# Patient Record
Sex: Female | Born: 1990 | Marital: Married | Smoking: Never smoker
Health system: Southern US, Community
[De-identification: ages and names within clinical notes are randomized; demographics above are authoritative.]

## PROBLEM LIST (undated history)

## (undated) DIAGNOSIS — F32A Depression, unspecified: Secondary | ICD-10-CM

## (undated) DIAGNOSIS — G039 Meningitis, unspecified: Secondary | ICD-10-CM

## (undated) DIAGNOSIS — R519 Headache, unspecified: Secondary | ICD-10-CM

## (undated) DIAGNOSIS — D649 Anemia, unspecified: Secondary | ICD-10-CM

## (undated) DIAGNOSIS — J45909 Unspecified asthma, uncomplicated: Secondary | ICD-10-CM

## (undated) DIAGNOSIS — J189 Pneumonia, unspecified organism: Secondary | ICD-10-CM

## (undated) DIAGNOSIS — Z8719 Personal history of other diseases of the digestive system: Secondary | ICD-10-CM

## (undated) DIAGNOSIS — T8859XA Other complications of anesthesia, initial encounter: Secondary | ICD-10-CM

## (undated) DIAGNOSIS — C801 Malignant (primary) neoplasm, unspecified: Secondary | ICD-10-CM

## (undated) DIAGNOSIS — F909 Attention-deficit hyperactivity disorder, unspecified type: Secondary | ICD-10-CM

## (undated) HISTORY — DX: Depression, unspecified: F32.A

## (undated) HISTORY — DX: Anemia, unspecified: D64.9

## (undated) HISTORY — DX: Personal history of other diseases of the digestive system: Z87.19

## (undated) HISTORY — PX: APPENDECTOMY: SHX54

## (undated) HISTORY — PX: WISDOM TOOTH EXTRACTION: SHX21

## (undated) HISTORY — PX: HAND SURGERY: SHX662

## (undated) HISTORY — PX: MECKEL DIVERTICULUM EXCISION: SHX314

---
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
## 2012-01-03 ENCOUNTER — Emergency Department: Payer: Self-pay | Admitting: Emergency Medicine

## 2012-04-19 IMAGING — CR DG KNEE COMPLETE 4+V*R*
1 series · 4 of 4 positions shown · non-contrast
Comparison: none

REASON FOR EXAM: knee pain
COMMENTS:   May transport without cardiac monitor

PROCEDURE:     DXR - DXR KNEE RT COMP WITH OBLIQUES  - August 15, 2010  [DATE]
RESULT:     Findings:
4 views of the right knee demonstrates no acute fracture or dislocation.
There is no significant joint effusion.

[Series 1: view not recorded · 0.17mm/px · 4 of 4 slices shown]
[im 1/4]
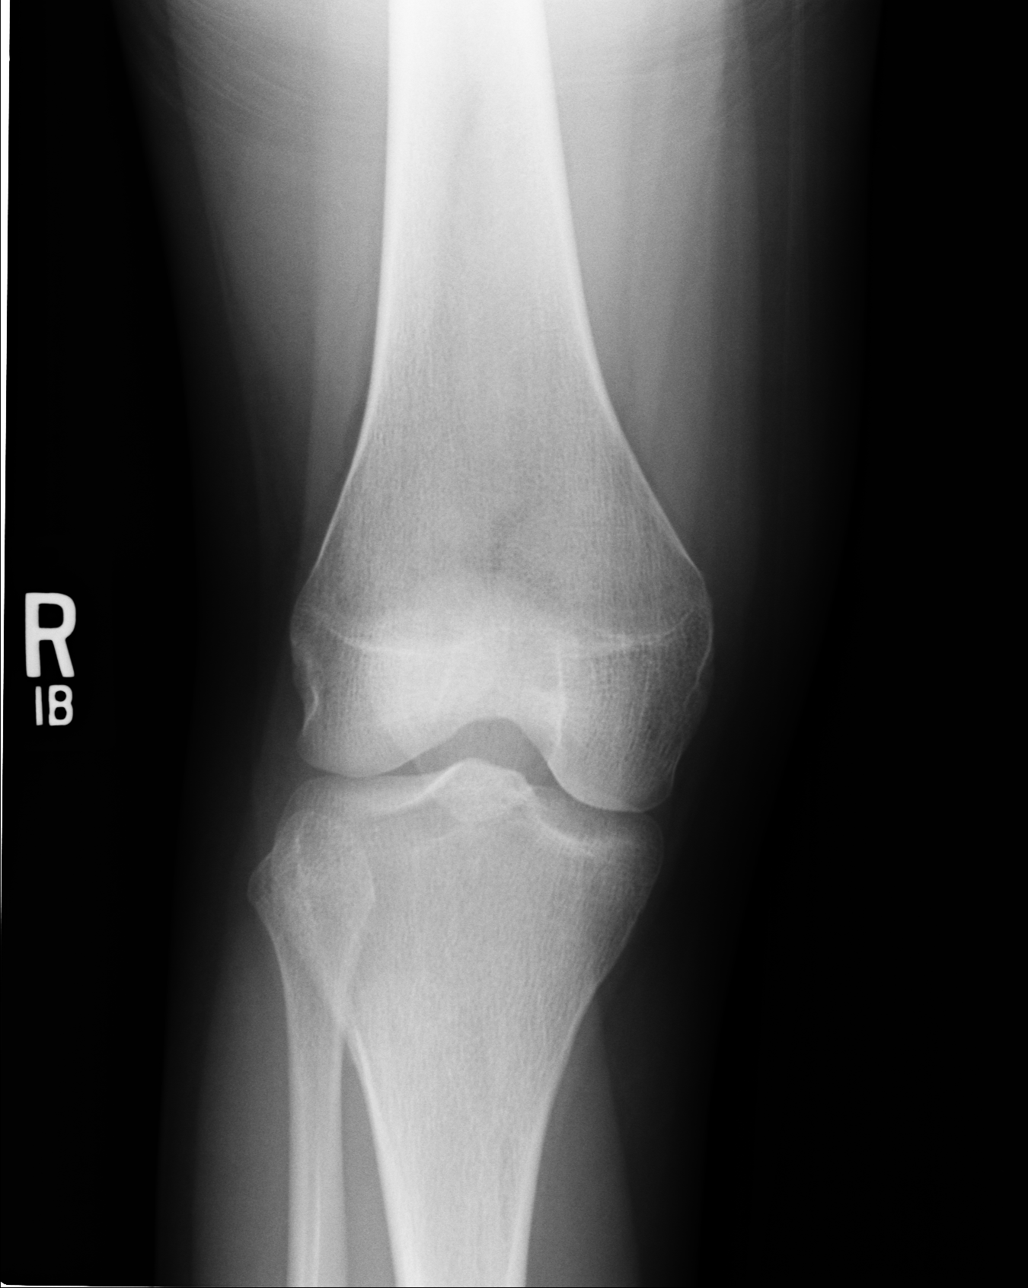
[im 2/4]
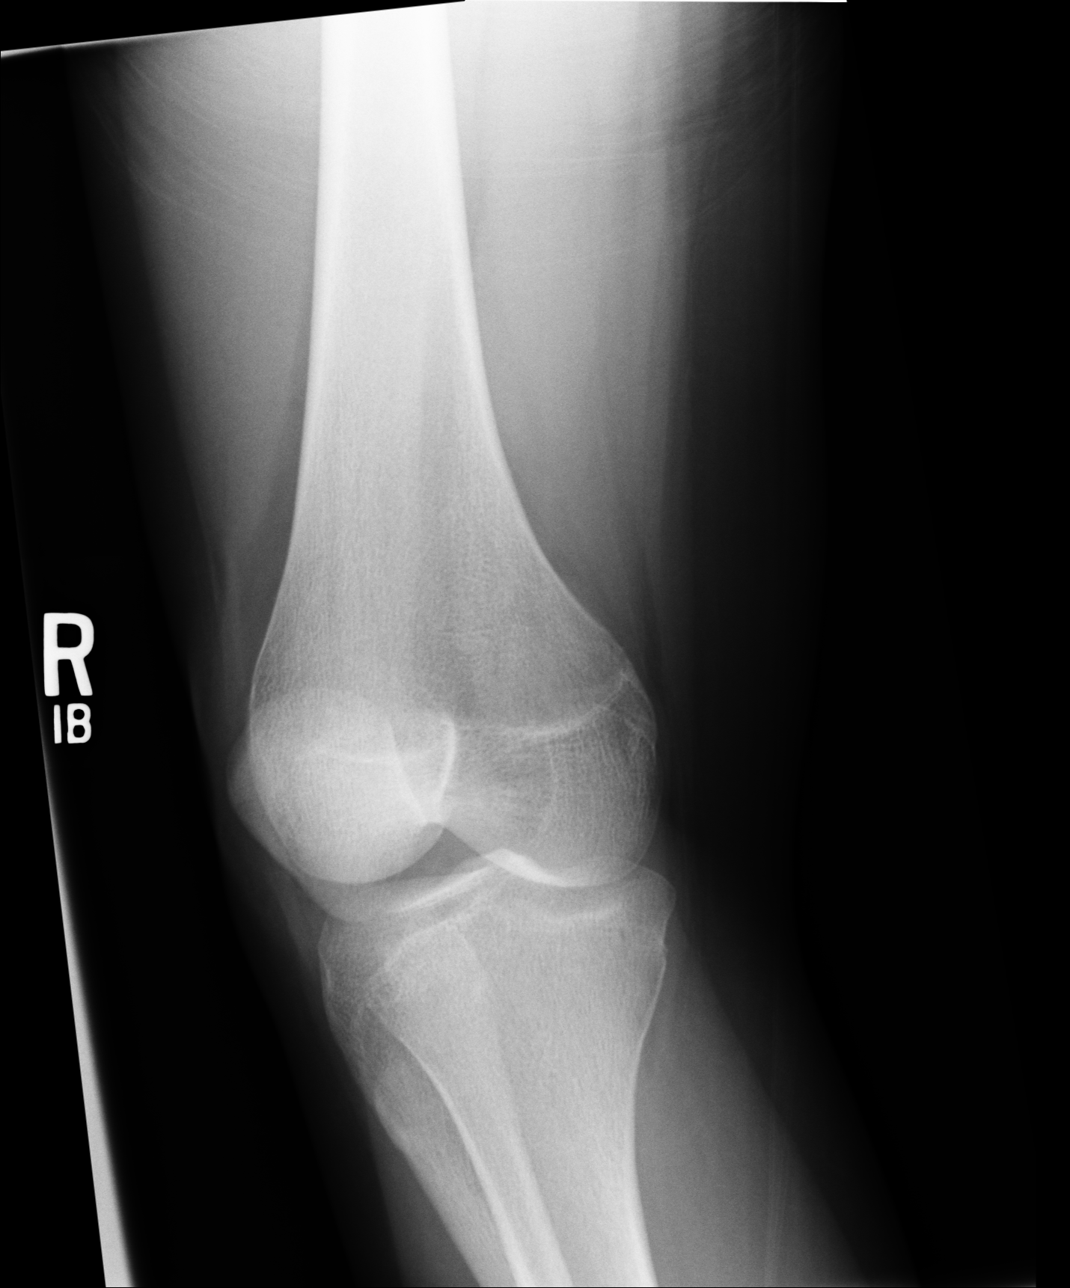
[im 3/4]
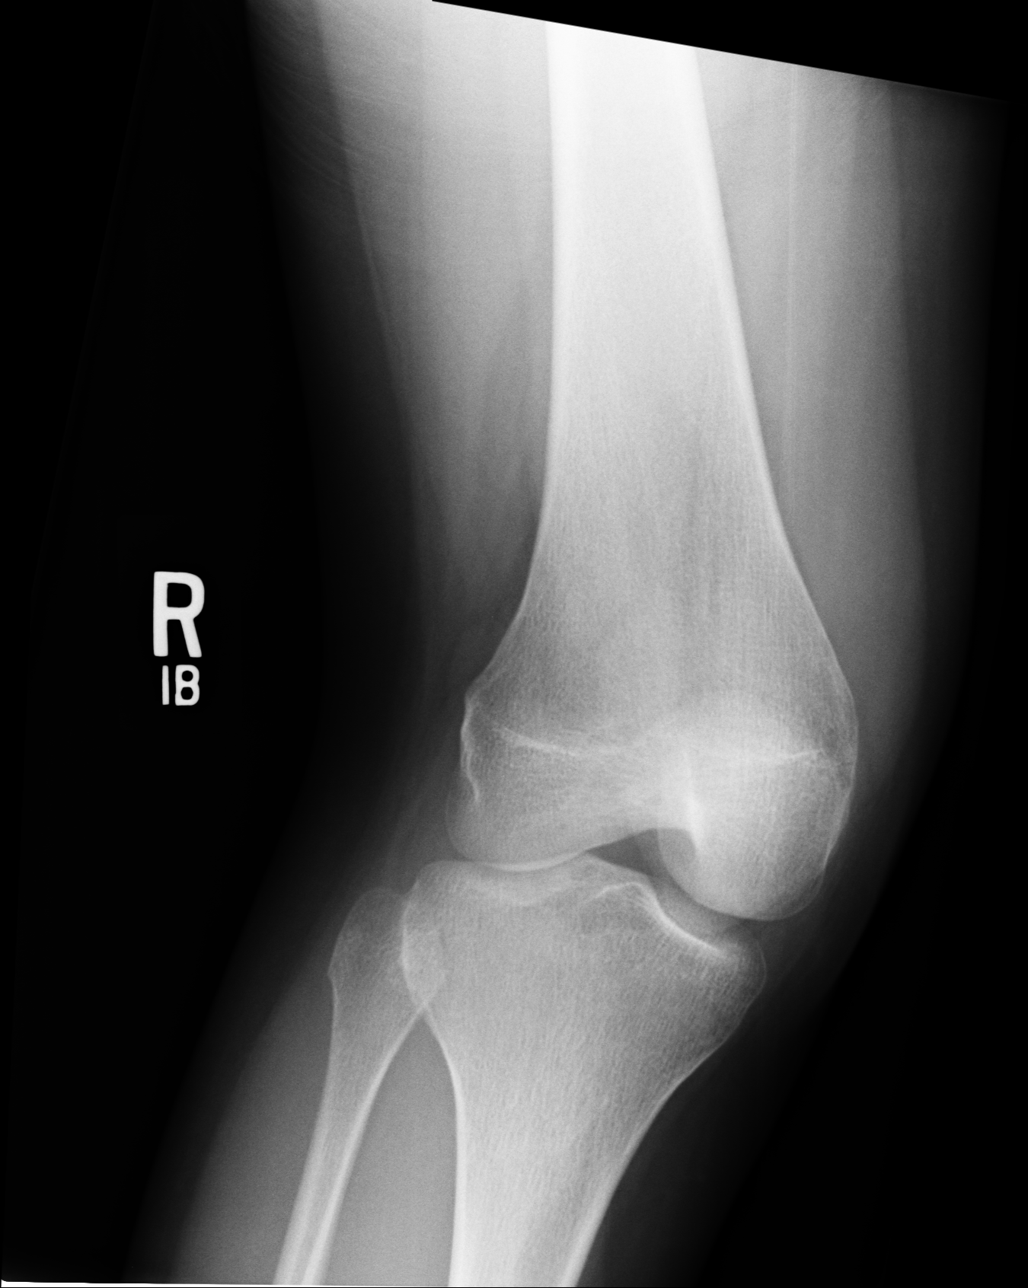
[im 4/4]
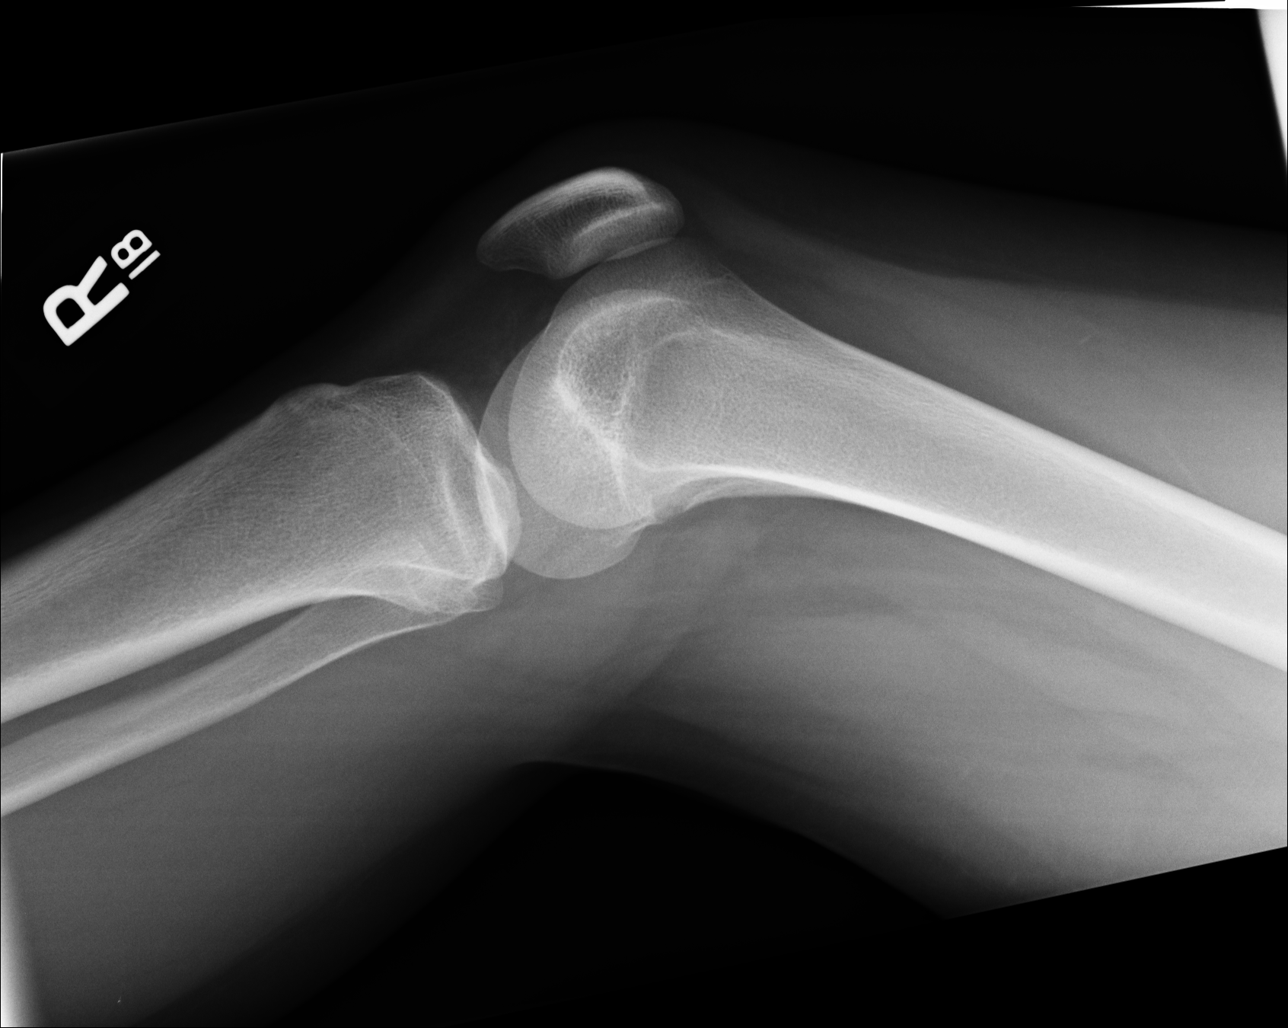

[4 of 4 positions shown; findings below may reference images not displayed]

IMPRESSION: No acute osseous injury of the right knee.

## 2013-04-11 ENCOUNTER — Emergency Department: Payer: Self-pay | Admitting: Emergency Medicine

## 2013-04-11 LAB — CBC
MCHC: 34.5 g/dL (ref 32.0–36.0)
MCV: 86 fL (ref 80–100)
Platelet: 257 10*3/uL (ref 150–440)
RBC: 4.29 10*6/uL (ref 3.80–5.20)
RDW: 13.7 % (ref 11.5–14.5)
WBC: 8.9 10*3/uL (ref 3.6–11.0)

## 2013-04-11 LAB — URINALYSIS, COMPLETE
Bilirubin,UR: NEGATIVE
Blood: NEGATIVE
Glucose,UR: NEGATIVE mg/dL (ref 0–75)
Protein: NEGATIVE
Squamous Epithelial: <1
WBC UR: 2 /HPF (ref 0–5)

## 2013-04-11 LAB — BASIC METABOLIC PANEL
Anion Gap: 5 — ABNORMAL LOW (ref 7–16)
Chloride: 108 mmol/L — ABNORMAL HIGH (ref 98–107)
EGFR (Non-African Amer.): >60
Glucose: 97 mg/dL (ref 65–99)
Potassium: 3.4 mmol/L — ABNORMAL LOW (ref 3.5–5.1)

## 2013-04-14 LAB — BETA STREP CULTURE(ARMC)

## 2015-09-23 DIAGNOSIS — G039 Meningitis, unspecified: Secondary | ICD-10-CM

## 2015-09-23 HISTORY — DX: Meningitis, unspecified: G03.9

## 2016-02-23 ENCOUNTER — Encounter: Payer: Self-pay | Admitting: Emergency Medicine

## 2016-02-23 ENCOUNTER — Inpatient Hospital Stay
Admission: EM | Admit: 2016-02-23 | Discharge: 2016-02-26 | DRG: 872 | Disposition: A | Discharge status: Home / Self Care | Payer: 59 | Attending: Internal Medicine | Admitting: Internal Medicine

## 2016-02-23 ENCOUNTER — Emergency Department: Payer: Self-pay

## 2016-02-23 ENCOUNTER — Inpatient Hospital Stay: Payer: Self-pay

## 2016-02-23 DIAGNOSIS — R291 Meningismus: Secondary | ICD-10-CM

## 2016-02-23 DIAGNOSIS — A419 Sepsis, unspecified organism: Principal | ICD-10-CM | POA: Diagnosis present

## 2016-02-23 DIAGNOSIS — B349 Viral infection, unspecified: Secondary | ICD-10-CM | POA: Diagnosis present

## 2016-02-23 DIAGNOSIS — J029 Acute pharyngitis, unspecified: Secondary | ICD-10-CM | POA: Diagnosis present

## 2016-02-23 DIAGNOSIS — R519 Headache, unspecified: Secondary | ICD-10-CM

## 2016-02-23 DIAGNOSIS — R51 Headache: Secondary | ICD-10-CM

## 2016-02-23 DIAGNOSIS — Z818 Family history of other mental and behavioral disorders: Secondary | ICD-10-CM

## 2016-02-23 DIAGNOSIS — B2790 Infectious mononucleosis, unspecified without complication: Secondary | ICD-10-CM | POA: Diagnosis present

## 2016-02-23 DIAGNOSIS — M542 Cervicalgia: Secondary | ICD-10-CM | POA: Diagnosis present

## 2016-02-23 LAB — CBC WITH DIFFERENTIAL/PLATELET
BASOS ABS: 0.4 10*3/uL — AB (ref 0–0.1)
EOS ABS: 0 10*3/uL (ref 0–0.7)
HCT: 35.8 % (ref 35.0–47.0)
HEMOGLOBIN: 12.4 g/dL (ref 12.0–16.0)
LYMPHS ABS: 0.6 10*3/uL — AB (ref 1.0–3.6)
Lymphocytes Relative: 7 %
MCH: 29 pg (ref 26.0–34.0)
MCHC: 34.6 g/dL (ref 32.0–36.0)
MCV: 83.6 fL (ref 80.0–100.0)
Monocytes Absolute: 0.6 10*3/uL (ref 0.2–0.9)
Neutro Abs: 6.6 10*3/uL — ABNORMAL HIGH (ref 1.4–6.5)
Neutrophils Relative %: 80 %
Platelets: 235 10*3/uL (ref 150–440)
RBC: 4.29 MIL/uL (ref 3.80–5.20)
RDW: 14.1 % (ref 11.5–14.5)
WBC: 8.2 10*3/uL (ref 3.6–11.0)

## 2016-02-23 LAB — CSF CELL COUNT WITH DIFFERENTIAL
EOS CSF: 0 %
Eosinophils, CSF: 0 %
Lymphs, CSF: 67 %
Lymphs, CSF: 86 %
Monocyte-Macrophage-Spinal Fluid: 14 %
Monocyte-Macrophage-Spinal Fluid: 33 %
RBC Count, CSF: 2 /mm3 (ref 0–3)
RBC Count, CSF: 7 /mm3 — ABNORMAL HIGH (ref 0–3)
Segmented Neutrophils-CSF: 0 %
Segmented Neutrophils-CSF: 0 %
TUBE #: 1
Tube #: 1
WBC, CSF: 0 /mm3
WBC, CSF: 2 /mm3

## 2016-02-23 LAB — RAPID HIV SCREEN (HIV 1/2 AB+AG)
HIV 1/2 Antibodies: NONREACTIVE
HIV-1 P24 Antigen - HIV24: NONREACTIVE

## 2016-02-23 LAB — PROTEIN AND GLUCOSE, CSF
Glucose, CSF: 77 mg/dL — ABNORMAL HIGH (ref 40–70)
Total  Protein, CSF: 21 mg/dL (ref 15–45)

## 2016-02-23 LAB — COMPREHENSIVE METABOLIC PANEL
ALK PHOS: 49 U/L (ref 38–126)
ALT: 16 U/L (ref 14–54)
AST: 21 U/L (ref 15–41)
Albumin: 4.1 g/dL (ref 3.5–5.0)
Anion gap: 8 (ref 5–15)
BUN: 13 mg/dL (ref 6–20)
CALCIUM: 8.7 mg/dL — AB (ref 8.9–10.3)
CO2: 20 mmol/L — AB (ref 22–32)
CREATININE: 0.94 mg/dL (ref 0.44–1.00)
Chloride: 104 mmol/L (ref 101–111)
GFR calc non Af Amer: >60 mL/min (ref >60)
GLUCOSE: 134 mg/dL — AB (ref 65–99)
Potassium: 3.3 mmol/L — ABNORMAL LOW (ref 3.5–5.1)
SODIUM: 132 mmol/L — AB (ref 135–145)
Total Bilirubin: 0.7 mg/dL (ref 0.3–1.2)
Total Protein: 7.6 g/dL (ref 6.5–8.1)

## 2016-02-23 LAB — PROTIME-INR
INR: 1.16
PROTHROMBIN TIME: 15 s (ref 11.4–15.0)

## 2016-02-23 LAB — LACTIC ACID, PLASMA: Lactic Acid, Venous: 0.7 mmol/L (ref 0.5–2.0)

## 2016-02-23 LAB — TROPONIN I: Troponin I: <0.03 ng/mL (ref <0.031)

## 2016-02-23 LAB — POCT RAPID STREP A: Streptococcus, Group A Screen (Direct): NEGATIVE

## 2016-02-23 LAB — URINALYSIS COMPLETE WITH MICROSCOPIC (ARMC ONLY)
Bilirubin Urine: NEGATIVE
Glucose, UA: NEGATIVE mg/dL
Hgb urine dipstick: NEGATIVE
Ketones, ur: NEGATIVE mg/dL
Leukocytes, UA: NEGATIVE
Nitrite: NEGATIVE
Protein, ur: NEGATIVE mg/dL
Specific Gravity, Urine: 1.013 (ref 1.005–1.030)
pH: 5 (ref 5.0–8.0)

## 2016-02-23 LAB — HCG, QUANTITATIVE, PREGNANCY: hCG, Beta Chain, Quant, S: <1 m[IU]/mL (ref <5)

## 2016-02-23 LAB — LIPASE, BLOOD: Lipase: 31 U/L (ref 11–51)

## 2016-02-23 LAB — MONONUCLEOSIS SCREEN: Mono Screen: POSITIVE — AB

## 2016-02-23 LAB — RAPID INFLUENZA A&B ANTIGENS
Influenza A (ARMC): NEGATIVE
Influenza B (ARMC): NEGATIVE

## 2016-02-23 LAB — APTT: aPTT: 36 seconds (ref 24–36)

## 2016-02-23 MED ORDER — ACETAMINOPHEN 500 MG PO TABS
1000.0000 mg | ORAL_TABLET | Freq: Once | ORAL | Status: AC
  Administered 2016-02-23: 1000 mg via ORAL
  Filled 2016-02-23: qty 2

## 2016-02-23 MED ORDER — VANCOMYCIN HCL IN DEXTROSE 750-5 MG/150ML-% IV SOLN
750.0000 mg | Freq: Two times a day (BID) | INTRAVENOUS | Status: DC
  Filled 2016-02-23 (×2): qty 150

## 2016-02-23 MED ORDER — IBUPROFEN 600 MG PO TABS
600.0000 mg | ORAL_TABLET | Freq: Once | ORAL | Status: AC
  Administered 2016-02-23: 600 mg via ORAL

## 2016-02-23 MED ORDER — DIPHENHYDRAMINE HCL 50 MG/ML IJ SOLN
25.0000 mg | Freq: Once | INTRAMUSCULAR | Status: AC
  Administered 2016-02-23: 25 mg via INTRAVENOUS
  Filled 2016-02-23: qty 1

## 2016-02-23 MED ORDER — ONDANSETRON HCL 4 MG PO TABS
4.0000 mg | ORAL_TABLET | Freq: Four times a day (QID) | ORAL | Status: DC | PRN
  Filled 2016-02-23: qty 1

## 2016-02-23 MED ORDER — ACETAMINOPHEN 325 MG PO TABS
650.0000 mg | ORAL_TABLET | Freq: Four times a day (QID) | ORAL | Status: DC | PRN
  Administered 2016-02-23 – 2016-02-25 (×2): 650 mg via ORAL
  Filled 2016-02-23 (×2): qty 2

## 2016-02-23 MED ORDER — ONDANSETRON HCL 4 MG/2ML IJ SOLN
4.0000 mg | Freq: Once | INTRAMUSCULAR | Status: AC
  Administered 2016-02-23: 4 mg via INTRAVENOUS
  Filled 2016-02-23: qty 2

## 2016-02-23 MED ORDER — VANCOMYCIN HCL IN DEXTROSE 1-5 GM/200ML-% IV SOLN
1000.0000 mg | INTRAVENOUS | Status: AC
  Administered 2016-02-23: 1000 mg via INTRAVENOUS
  Filled 2016-02-23: qty 200

## 2016-02-23 MED ORDER — DEXTROSE 5 % IV SOLN
2.0000 g | Freq: Once | INTRAVENOUS | Status: AC
  Administered 2016-02-23: 2 g via INTRAVENOUS
  Filled 2016-02-23: qty 2

## 2016-02-23 MED ORDER — DEXTROSE 5 % IV SOLN
2.0000 g | Freq: Two times a day (BID) | INTRAVENOUS | Status: DC
  Filled 2016-02-23: qty 2

## 2016-02-23 MED ORDER — DEXAMETHASONE SODIUM PHOSPHATE 10 MG/ML IJ SOLN
15.0000 mg | Freq: Once | INTRAMUSCULAR | Status: AC
  Administered 2016-02-23: 15 mg via INTRAVENOUS
  Filled 2016-02-23: qty 2

## 2016-02-23 MED ORDER — LIDOCAINE HCL (PF) 1 % IJ SOLN
INTRAMUSCULAR | Status: AC
  Filled 2016-02-23: qty 5

## 2016-02-23 MED ORDER — SODIUM CHLORIDE 0.9% FLUSH
3.0000 mL | Freq: Two times a day (BID) | INTRAVENOUS | Status: DC
  Administered 2016-02-23 – 2016-02-25 (×6): 3 mL via INTRAVENOUS

## 2016-02-23 MED ORDER — AMOXICILLIN-POT CLAVULANATE 875-125 MG PO TABS
1.0000 | ORAL_TABLET | Freq: Two times a day (BID) | ORAL | Status: DC
  Administered 2016-02-23 – 2016-02-26 (×6): 1 via ORAL
  Filled 2016-02-23 (×6): qty 1

## 2016-02-23 MED ORDER — IBUPROFEN 600 MG PO TABS
ORAL_TABLET | ORAL | Status: AC
  Administered 2016-02-23: 600 mg via ORAL
  Filled 2016-02-23: qty 1

## 2016-02-23 MED ORDER — LIDOCAINE-EPINEPHRINE (PF) 1 %-1:200000 IJ SOLN
INTRAMUSCULAR | Status: AC
  Administered 2016-02-23: 30 mL
  Filled 2016-02-23: qty 30

## 2016-02-23 MED ORDER — ONDANSETRON HCL 4 MG/2ML IJ SOLN
4.0000 mg | Freq: Four times a day (QID) | INTRAMUSCULAR | Status: DC | PRN
  Administered 2016-02-24: 09:00:00 4 mg via INTRAVENOUS
  Filled 2016-02-23: qty 2

## 2016-02-23 MED ORDER — SODIUM CHLORIDE 0.9 % IV SOLN
INTRAVENOUS | Status: AC
  Administered 2016-02-23: 10:00:00 via INTRAVENOUS

## 2016-02-23 MED ORDER — SODIUM CHLORIDE 0.9 % IV BOLUS (SEPSIS)
1000.0000 mL | Freq: Once | INTRAVENOUS | Status: AC
  Administered 2016-02-23: 1000 mL via INTRAVENOUS

## 2016-02-23 MED ORDER — ENOXAPARIN SODIUM 40 MG/0.4ML ~~LOC~~ SOLN
40.0000 mg | SUBCUTANEOUS | Status: DC
  Administered 2016-02-23 – 2016-02-25 (×3): 40 mg via SUBCUTANEOUS
  Filled 2016-02-23 (×3): qty 0.4

## 2016-02-23 MED ORDER — MORPHINE SULFATE (PF) 4 MG/ML IV SOLN
4.0000 mg | Freq: Once | INTRAVENOUS | Status: AC
  Administered 2016-02-23: 4 mg via INTRAVENOUS
  Filled 2016-02-23: qty 1

## 2016-02-23 MED ORDER — MORPHINE SULFATE (PF) 2 MG/ML IV SOLN
2.0000 mg | Freq: Once | INTRAVENOUS | Status: AC
  Administered 2016-02-23: 2 mg via INTRAVENOUS
  Filled 2016-02-23: qty 1

## 2016-02-23 MED ORDER — MORPHINE SULFATE (PF) 2 MG/ML IV SOLN
2.0000 mg | INTRAVENOUS | Status: DC | PRN
  Administered 2016-02-23 – 2016-02-24 (×2): 2 mg via INTRAVENOUS
  Filled 2016-02-23 (×3): qty 1

## 2016-02-23 MED ORDER — ACETAMINOPHEN 650 MG RE SUPP
650.0000 mg | Freq: Four times a day (QID) | RECTAL | Status: DC | PRN
Start: 2016-02-23 — End: 2016-02-26

## 2016-02-23 MED ORDER — DEXAMETHASONE SODIUM PHOSPHATE 4 MG/ML IJ SOLN
8.0000 mg | Freq: Four times a day (QID) | INTRAMUSCULAR | Status: DC
  Administered 2016-02-23 – 2016-02-25 (×8): 8 mg via INTRAVENOUS
  Filled 2016-02-23 (×8): qty 2

## 2016-02-23 NOTE — ED Notes (Signed)
Pt states she believes the itching started after the vancomycin began infusing.. States she has had morphine before without reaction..Marland Kitchen

## 2016-02-23 NOTE — H&P (Signed)
[facility] [place] [facility]   PATIENT NAME: Christina RoersRachel Ruiz    MR#:  [id number]  DATE OF BIRTH:  [birth year]  DATE OF ADMISSION:  02/23/2016  PRIMARY CARE PHYSICIAN: Sherlene ShamsULLO, TERESA L, MD   REQUESTING/REFERRING PHYSICIAN: Fanny BienQuale, MD  CHIEF COMPLAINT:   Chief Complaint  Patient presents with  . Fever    HISTORY OF PRESENT ILLNESS:  Christina Ruiz  is a 25 y.o. female who presents with Fever, severe headache, sore throat, neck stiffness. Patient states that she developed sore throat yesterday, has been unable to eat or drink anything since that time. She then began to develop headache and neck stiffness. She states her headache is the worst she's ever had. She denies any vision changes, but does state that she has had some lightheadedness when she stands up. She's also had some nausea, but no vomiting. Here in the ED she had a fever of 104, she was tachycardic. She has significant nuchal rigidity and tenderness. Hospitalists were called for admission and further evaluation after meningitis protocol was initiated.  PAST MEDICAL HISTORY:   Past Medical History  Diagnosis Date  . History of Meckel's diverticulum     ? had some surgery as a child    PAST SURGICAL HISTORY:   Past Surgical History  Procedure Laterality Date  . Small intestine surgery      ?  Marland Kitchen. Appendectomy      SOCIAL HISTORY:   Social History  Substance Use Topics  . Smoking status: Never Smoker   . Smokeless tobacco: Never Used  . Alcohol Use: No    FAMILY HISTORY:   Family History  Problem Relation Age of Onset  . Bipolar disorder Mother   . Heart disease Other     DRUG ALLERGIES:  No Known Allergies  MEDICATIONS AT HOME:   Prior to Admission medications   Medication Sig Start Date End Date Taking? Authorizing Provider  ferrous sulfate 325 (65 FE) MG tablet Take 325 mg by mouth daily with breakfast.      Historical Provider, MD  Multiple Vitamin (MULTIVITAMIN) tablet  Take 1 tablet by mouth daily.      Historical Provider, MD  norethindrone-ethinyl estradiol (FEMHRT LOW DOSE) 0.5-2.5 MG-MCG per tablet Take 1 tablet by mouth daily.      Historical Provider, MD    REVIEW OF SYSTEMS:  Review of Systems  Constitutional: Positive for fever, chills and malaise/fatigue. Negative for weight loss.  HENT: Positive for sore throat. Negative for ear pain, hearing loss and tinnitus.   Eyes: Negative for blurred vision, double vision, pain and redness.  Respiratory: Negative for cough, hemoptysis and shortness of breath.   Cardiovascular: Negative for chest pain, palpitations, orthopnea and leg swelling.  Gastrointestinal: Negative for nausea, vomiting, abdominal pain, diarrhea and constipation.  Genitourinary: Negative for dysuria, frequency and hematuria.  Musculoskeletal: Positive for neck pain. Negative for back pain and joint pain.  Skin:       No acne, rash, or lesions  Neurological: Positive for headaches. Negative for dizziness, tremors, focal weakness and weakness.  Endo/Heme/Allergies: Negative for polydipsia. Does not bruise/bleed easily.  Psychiatric/Behavioral: Negative for depression. The patient is not nervous/anxious and does not have insomnia.      VITAL SIGNS:   Filed Vitals:   02/23/16 0425 02/23/16 0500 02/23/16 0552  BP: 119/62 122/76   Pulse: 152 118   Temp: 104 F (40 C)  103.3 F (39.6 C)  TempSrc: Oral  Oral  Resp: 24  18   Height:  (1.6 m)    Weight: 59.875 kg (132 lb)    SpO2: 100% 97%    Wt Readings from Last 3 Encounters:  02/23/16 59.875 kg (132 lb)  02/28/11 58.06 kg (128 lb) (59 %*, Z = 0.23)  01/24/11 56.246 kg (124 lb) (52 %*, Z = 0.05)   * Growth percentiles are based on CDC 2-20 Years data.    PHYSICAL EXAMINATION:  Physical Exam  Vitals reviewed. Constitutional: She is oriented to person, place, and time. She appears well-developed and well-nourished. No distress.  HENT:  Head: Normocephalic and  atraumatic.  Mouth/Throat: Oropharynx is clear and moist.  Eyes: Conjunctivae and EOM are normal. Pupils are equal, round, and reactive to light. No scleral icterus.  Neck: No JVD present. No thyromegaly present.  Positive Brudzinski sign  Cardiovascular: Regular rhythm and intact distal pulses.  Exam reveals no gallop and no friction rub.   No murmur heard. Tachycardic  Respiratory: Effort normal and breath sounds normal. No respiratory distress. She has no wheezes. She has no rales.  GI: Soft. Bowel sounds are normal. She exhibits no distension. There is no tenderness.  Musculoskeletal: Normal range of motion. She exhibits tenderness (significant neck stiffness and tenderness). She exhibits no edema.  No arthritis, no gout  Lymphadenopathy:    She has no cervical adenopathy.  Neurological: She is alert and oriented to person, place, and time. No cranial nerve deficit.  No dysarthria, no aphasia  Skin: Skin is warm and dry. No rash noted. No erythema.  Psychiatric: She has a normal mood and affect. Her behavior is normal. Judgment and thought content normal.    LABORATORY PANEL:   CBC  Recent Labs Lab 02/23/16 0441  WBC 8.2  HGB 12.4  HCT 35.8  PLT 235   ------------------------------------------------------------------------------------------------------------------  Chemistries   Recent Labs Lab 02/23/16 0441  NA 132*  K 3.3*  CL 104  CO2 20*  GLUCOSE 134*  BUN 13  CREATININE 0.94  CALCIUM 8.7*  AST 21  ALT 16  ALKPHOS 49  BILITOT 0.7   ------------------------------------------------------------------------------------------------------------------  Cardiac Enzymes  Recent Labs Lab 02/23/16 0441  TROPONINI <0.03   ------------------------------------------------------------------------------------------------------------------  RADIOLOGY:  No results found.  EKG:   Orders placed or performed during the hospital encounter of 02/23/16  . ED EKG  12-Lead  . ED EKG 12-Lead  . EKG 12-Lead  . EKG 12-Lead    IMPRESSION AND PLAN:  Principal Problem:   Sepsis (HCC) - suspected meningitis possibly with strep throat. IV antibiotics and IV steroids started in the ED, we'll continues on admission. CSF cultures and blood cultures sent. Hemodynamically stable, lactic acid normal, IV fluids for hydration. Active Problems:   Neck pain - pain control ordered, suspect this is due to meningitis, treatment for the same as above   Sore throat - strep cultures sent, antibiotics as above  All the records are reviewed and case discussed with ED provider. Management plans discussed with the patient and/or family.  DVT PROPHYLAXIS: SubQ lovenox  GI PROPHYLAXIS: None  ADMISSION STATUS: Inpatient  CODE STATUS: Full Code Status History    This patient does not have a recorded code status. Please follow your organizational policy for patients in this situation.      TOTAL TIME TAKING CARE OF THIS PATIENT: 45 minutes.    Keanu Lesniak FIELDING 02/23/2016, 6:05 AM  [facility]  Office  [phone]  CC: Primary care physician; Sherlene Shams, MD

## 2016-02-23 NOTE — ED Notes (Signed)
Pt with nuchal rigidity, back pain, generalized body aches, shoulder pain and headache with fever since yesterday. Pt obviously uncomfortable, pt cries and yells in pain when rn attempts to flex neck. Pt masked in triage.

## 2016-02-23 NOTE — Progress Notes (Signed)
Reported to Dr Allena KatzPatel that patient had an itching episode and redness on arm after receiving vancomycin in ED. Ordered regular diet and to discontinue vancomycin.

## 2016-02-23 NOTE — ED Provider Notes (Signed)
[facility] Emergency Department Provider Note  ____________________________________________  Time seen: Approximately 4:34 AM  I have reviewed the triage vital signs and the nursing notes.   HISTORY  Chief Complaint Fever    HPI Christina Ruiz is a 25 y.o. female presents to the hospital for evaluation of a severe headache and neck stiffness.  Denies previous medical history except for being diagnosed with possible strep throat yesterday having started taking Augmentin.  She reports that she's been feeling absolutely terrible, having a severe headache the worst she has ever had, a very stiff and extremely painful neck, a sore throat.  She doesn't report nausea and feeling sweaty and vomited. She has hardly been able to eat or drink anything all day. She is able to swallow, but states it hurts in the throat. She is not having any pain in the front of the neck but mostly in the back of the neck up into the head causing a throbbing severe headache.  She's had some nausea and mild upset stomach but denies severe abdominal pain, urinary symptoms or other concerns.  No history of poor immune state or immunosuppression that she does have a history of psoriasis.   Past Medical History  Diagnosis Date  . History of Meckel's diverticulum     ? had some surgery as a child    Patient Active Problem List   Diagnosis Date Noted  . Neck pain 02/23/2016  . Sore throat 02/23/2016  . Sepsis (HCC) 02/23/2016  . SOB (shortness of breath) 02/28/2011  . Syncope 01/24/2011  . Dizziness 01/24/2011    Past Surgical History  Procedure Laterality Date  . Small intestine surgery      ?  Marland Kitchen. Appendectomy      No current outpatient prescriptions on file.  Allergies Review of patient's allergies indicates no known allergies.  Family History  Problem Relation Age of Onset  . Bipolar disorder Mother   . Heart disease Other     Social History Social History   Substance Use Topics  . Smoking status: Never Smoker   . Smokeless tobacco: Never Used  . Alcohol Use: No    Review of Systems Constitutional: Fevers chills and weakness Eyes: No visual changes. ENT: No sore throat. Cardiovascular: Denies chest pain. Respiratory: Denies shortness of breath. Gastrointestinal: Crampy abdominal discomfort, nausea and feeling like she wants to vomit.  No diarrhea.  No constipation. She noticed her stools have been slightly yellowish in color recently. Genitourinary: Negative for dysuria. She denies pregnancy. Musculoskeletal: Negative for back pain. Skin: Negative for rash. Neurological: Negative for headaches, focal weakness or numbness.  10-point ROS otherwise negative.  No tick bites  ____________________________________________   PHYSICAL EXAM:  VITAL SIGNS: ED Triage Vitals  Enc Vitals Group     BP 02/23/16 0425 119/62 mmHg     Pulse Rate 02/23/16 0425 152     Resp 02/23/16 0425 24     Temp 02/23/16 0425 104 F (40 C)     Temp Source 02/23/16 0425 Oral     SpO2 02/23/16 0425 100 %     Weight 02/23/16 0425 132 lb (59.875 kg)     Height 02/23/16 0425 5\' 3"  (1.6 m)     Head Cir --      Peak Flow --      Pain Score 02/23/16 0425 10     Pain Loc --      Pain Edu? --      Excl. in GC? --  Constitutional: Alert and oriented. Moderately ill-appearing, appears in pain holding her neck still. Hand over her head due to headache. Eyes: Conjunctivae are normal. PERRL. EOMI. Head: Atraumatic. Nose: No congestion/rhinnorhea. Mouth/Throat: Mucous membranes are dry. Oropharynx slightly injected, tonsils equal, moderately swollen with slight exudates. There is no midline shift. No evidence of unilateral abnormality or evidence to support peritonsillar abscess. Neck: No stridor.  Patient unable to flex or extend the neck due to pain. The patient exhibits meningismus. Cardiovascular: Tachycardic rate, regular rhythm. Grossly normal heart sounds.   Good peripheral circulation. Respiratory: Slightly tachypnea but otherwise normal respiratory exam. No retractions. Lungs CTAB. Gastrointestinal: Soft and nontender. No distention. No CVA tenderness. Musculoskeletal: No lower extremity tenderness nor edema.  Neurologic:  Normal speech and language. No gross focal neurologic deficits are appreciated.Skin:  Skin is hot to the touch, dry and intact. No rash noted. Psychiatric: Mood and affect are slightly anxious, but extremely well oriented without alteration in mental state. Speech and behavior are normal.  ____________________________________________   LABS (all labs ordered are listed, but only abnormal results are displayed)  Labs Reviewed  COMPREHENSIVE METABOLIC PANEL - Abnormal; Notable for the following:    Sodium 132 (*)    Potassium 3.3 (*)    CO2 20 (*)    Glucose, Bld 134 (*)    Calcium 8.7 (*)    All other components within normal limits  CBC WITH DIFFERENTIAL/PLATELET - Abnormal; Notable for the following:    Neutro Abs 6.6 (*)    Lymphs Abs 0.6 (*)    Basophils Absolute 0.4 (*)    All other components within normal limits  URINALYSIS COMPLETEWITH MICROSCOPIC ([facility] ONLY) - Abnormal; Notable for the following:    Color, Urine YELLOW (*)    APPearance CLEAR (*)    Bacteria, UA RARE (*)    Squamous Epithelial / LPF 0-5 (*)    All other components within normal limits  MONONUCLEOSIS SCREEN - Abnormal; Notable for the following:    Mono Screen POSITIVE (*)    All other components within normal limits  CSF CELL COUNT WITH DIFFERENTIAL - Abnormal; Notable for the following:    Appearance, CSF CLEAR (*)    All other components within normal limits  CSF CELL COUNT WITH DIFFERENTIAL - Abnormal; Notable for the following:    Appearance, CSF CLEAR (*)    RBC Count, CSF 7 (*)    All other components within normal limits  PROTEIN AND GLUCOSE, CSF - Abnormal; Notable for the following:    Glucose, CSF 77 (*)    All other  components within normal limits  RAPID INFLUENZA A&B ANTIGENS ([facility] ONLY)  CSF CULTURE  CULTURE, BLOOD (ROUTINE X 2)  CULTURE, BLOOD (ROUTINE X 2)  URINE CULTURE  CULTURE, GROUP A STREP ([facility])  GRAM STAIN  LACTIC ACID, PLASMA  TROPONIN I  PROTIME-INR  LIPASE, BLOOD  APTT  HCG, QUANTITATIVE, PREGNANCY  RAPID HIV SCREEN (HIV 1/2 AB+AG)  CSF CELL COUNT WITH DIFFERENTIAL  HIV ANTIBODY (ROUTINE TESTING)  HERPES SIMPLEX VIRUS(HSV) DNA BY PCR  POCT RAPID STREP A   ____________________________________________  EKG  Reviewed and interpreted by me at 4:30 AM Heart rate 126 PR 170 QTc 400  Sinus tachycardia, nonspecific T-wave abnormality ____________________________________________  RADIOLOGY  DG Chest Port 1 View (Final result) Result time: 02/23/16 06:17:57   Final result by Rad Results In Interface (02/23/16 06:17:57)   Narrative:   CLINICAL DATA: Sepsis workup  EXAM: PORTABLE CHEST 1 VIEW  COMPARISON: 01/03/2012  FINDINGS: Normal heart size and mediastinal contours. Incidental biapical pleural thickening, stable. No infiltrate or edema. No effusion or pneumothorax. No acute osseous findings.  IMPRESSION: Negative portable chest.   Electronically Signed By: Marnee Spring M.D. On: 02/23/2016 06:17    ____________________________________________   PROCEDURES  Procedure(s) performed: Lumbar puncture  Critical Care performed: Yes, see critical care note(s)  LUMBAR PUNCTURE  Date/Time: 02/23/2016 at 5:57 AM Performed by: Sharyn Creamer  Consent: Verbal consent obtained. Written consent obtained. Risks and benefits: risks, benefits and alternatives were discussed Consent given by: Patient Patient understanding: patient states understanding of the procedure being performed  Patient consent: the patient's understanding of the procedure matches consent given  Procedure consent: procedure consent matches procedure scheduled  Relevant documents:  relevant documents present and verified  Test results: test results available and properly labeled Site marked: the operative site was marked Imaging studies: imaging studies available  Required items: required blood products, implants, devices, and special equipment available  Patient identity confirmed: verbally with patient and arm band  Time out: Immediately prior to procedure a "time out" was called to verify the correct patient, procedure, equipment, support staff and site/side marked as required.  Indications: Headache, fever, rule out meningitis  Anesthesia: local infiltration Local anesthetic: lidocaine 1% with epinephrine Anesthetic total: 3 ml Patient sedated:  Analgesia: Morphine, 4 mg  Preparation: Patient was prepped and draped in the usual sterile fashion. Lumbar space: L3-L4 interspace Patient's position: left lateral decubitus Needle gauge: 22 Needle length: 3.5 in Number of attempts: 1 Opening pressure: Less than 15 cm H2O Fluid appearance: Clear Tubes of fluid: 4 Total volume: 8 ml Post-procedure: site cleaned and adhesive bandage applied Patient tolerance: Patient tolerated the procedure well with no immediate complications  CRITICAL CARE Performed by: Sharyn Creamer   Total critical care time: 40 minutes  Critical care time was exclusive of separately billable procedures and treating other patients.  Critical care was necessary to treat or prevent imminent or life-threatening deterioration.  Critical care was time spent personally by me on the following activities: development of treatment plan with patient and/or surrogate as well as nursing, discussions with consultants, evaluation of patient's response to treatment, examination of patient, obtaining history from patient or surrogate, ordering and performing treatments and interventions, ordering and review of laboratory studies, ordering and review of radiographic studies, pulse oximetry and re-evaluation of  patient's condition.  Patient presents with evidence of sepsis, possible meningitis. Patient also has pharyngitis by clinical examination suspicious for possible causes including strep which may result in meningismus/meningitis, superinfection and bacteremia, potential for HIV/mono/viral versus other etiologies CONSIDERED. Due to the patient's presentation with severe headache, neck pain, elevated fever we did not delay antibiotic and menstruation due to potentially high risk for meningitis. Lumbar puncture performed successfully, neuro intact without complication. The patient does report improvement in her vital signs are improving after fluids and pain medication/Tylenol. Discussed with the hospitalist service, Dr. Anne Hahn at 5:50 AM although we do not have a clear exact etiology we will admit the patient for rule out meningitis as well as further workup and evaluation under the care of the internal medicine team due to severity of symptoms and clinical sepsis. ____________________________________________   INITIAL IMPRESSION / ASSESSMENT AND PLAN / ED COURSE  Pertinent labs & imaging results that were available during my care of the patient were reviewed by me and considered in my medical decision making (see chart for details).  Code sepsis initiated.  ED Sepsis - Repeat Assessment  Performed at:    530am  Last Vitals:    HR 102, R14, 105/55, 98RA  Heart:      Clear  Lungs:     Clear  Capillary Refill:   Less than 1 second  Peripheral Pulse (include location): Right arm, radial   Skin (include color):   Normal   ----------------------------------------- 4:57 AM on 02/23/2016 -----------------------------------------  Patient verbally consented as well as written consent for spinal tap to "evaluate for meningitis". Patient name, date of birth, and procedure verified with the patient prior. Based on her symptoms are severe headache, meningismus, high fever and presentation I  believe that spinal tap is a clinically indicated procedure and the benefits outweigh the risks. Reviewed with the patient who is consenting and agreeable, all questions answered.  Patient will be admitted with further workup including labs pending, clear indication of given concern for obvious infectious etiology with severe meningismus, elevated fever, tachycardia being criteria for sepsis possibly bacterial versus viral etiologies CONSIDERED. Antibiotics given ____________________________________________   FINAL CLINICAL IMPRESSION(S) / ED DIAGNOSES  Final diagnoses:  Sepsis, due to unspecified organism (HCC)  Meningismus  Pharyngitis      Sharyn Creamer, MD 02/23/16 [phone]

## 2016-02-23 NOTE — ED Notes (Signed)
The patient was transported to admission room 103 without any issues. The patient was also transported wearing a surgical mask due to possible meningitis.

## 2016-02-23 NOTE — Progress Notes (Addendum)
Patient ID: Christina Ruiz, female   DOB: 1990-11-07, 25 y.o.   MRN: [id number] [facility] [place] [facility]   PATIENT NAME: Christina Ruiz    MR#:  [id number]  DATE OF BIRTH:  1990-11-07  SUBJECTIVE:  Came in with fever, sore throat and neck pain  REVIEW OF SYSTEMS:   Review of Systems  Constitutional: Positive for fever. Negative for chills and weight loss.  HENT: Positive for sore throat. Negative for ear discharge, ear pain and nosebleeds.   Eyes: Negative for blurred vision, pain and discharge.  Respiratory: Negative for sputum production, shortness of breath, wheezing and stridor.   Cardiovascular: Negative for chest pain, palpitations, orthopnea and PND.  Gastrointestinal: Negative for nausea, vomiting, abdominal pain and diarrhea.  Genitourinary: Negative for urgency and frequency.  Musculoskeletal: Negative for back pain and joint pain.  Neurological: Positive for weakness and headaches. Negative for sensory change, speech change and focal weakness.  Psychiatric/Behavioral: Negative for depression and hallucinations. The patient is not nervous/anxious.  yes All other systems reviewed and are negative.  Tolerating Diet:yes Tolerating PT: not needed  DRUG ALLERGIES:  No Known Allergies  VITALS:  Blood pressure 100/67, pulse 65, temperature 97.8 F (36.6 C), temperature source Oral, resp. rate 18, height 5\' 3"  (1.6 m), weight 60.47 kg (133 lb 5 oz), last menstrual period 01/25/2016, SpO2 100 %.  PHYSICAL EXAMINATION:   Physical Exam  GENERAL:  25 y.o.-year-old patient lying in the bed with no acute distress.  EYES: Pupils equal, round, reactive to light and accommodation. No scleral icterus. Extraocular muscles intact.  HEENT: Head atraumatic, normocephalic. Oropharynx and nasopharynx clear.  NECK:  Supple, no jugular venous distention. No thyroid enlargement, no tenderness.  LUNGS: Normal breath sounds bilaterally, no wheezing, rales,  rhonchi. No use of accessory muscles of respiration.  CARDIOVASCULAR: S1, S2 normal. No murmurs, rubs, or gallops.  ABDOMEN: Soft, nontender, nondistended. Bowel sounds present. No organomegaly or mass.  EXTREMITIES: No cyanosis, clubbing or edema b/l.    NEUROLOGIC: Cranial nerves II through XII are intact. No focal Motor or sensory deficits b/l.   PSYCHIATRIC:  patient is alert and oriented x 3.  SKIN: No obvious rash, lesion, or ulcer.   LABORATORY PANEL:  CBC  Recent Labs Lab 02/23/16 0441  WBC 8.2  HGB 12.4  HCT 35.8  PLT 235    Chemistries   Recent Labs Lab 02/23/16 0441  NA 132*  K 3.3*  CL 104  CO2 20*  GLUCOSE 134*  BUN 13  CREATININE 0.94  CALCIUM 8.7*  AST 21  ALT 16  ALKPHOS 49  BILITOT 0.7   Cardiac Enzymes  Recent Labs Lab 02/23/16 0441  TROPONINI <0.03   RADIOLOGY:  Dg Chest Port 1 View  02/23/2016  CLINICAL DATA:  Sepsis workup EXAM: PORTABLE CHEST 1 VIEW COMPARISON:  01/03/2012 FINDINGS: Normal heart size and mediastinal contours. Incidental biapical pleural thickening, stable. No infiltrate or edema. No effusion or pneumothorax. No acute osseous findings. IMPRESSION: Negative portable chest. Electronically Signed   By: Marnee SpringJonathon  Watts M.D.   On: 02/23/2016 06:17   ASSESSMENT AND PLAN:  Christina Ruiz is a 25 y.o. female who presents with Fever, severe headache, sore throat, neck stiffness. Patient states that she developed sore throat yesterday, has been unable to eat or drink anything since that time. She then began to develop headache and neck stiffness. She states her headache is the worst she's ever had. She denies any vision changes, but does state  that she has had some lightheadedness when she stands  1.fever,headache and sore throat with positive grp a strept(as out pt) and positive infect mono ([facility] -change to po augmentin -came in with fever of 104---afebrile noow. Feels better -symptomatic care  2.Fever/headache and neck  rigidity -CSF c/w viral meningitis -symptomatic care  3. DVT prophylaxis lovenox  Case discussed with Care Management/Social Worker. Management plans discussed with the patient, family and they are in agreement.  CODE STATUS:full DVT Prophylaxis: lovenox TOTAL TIME TAKING CARE OF THIS PATIENT: .  >50% time spent on counselling and coordination of care  POSSIBLE D/C IN 1-2 DAYS, DEPENDING ON CLINICAL CONDITION.  Note: This dictation was prepared with Dragon dictation along with smaller phrase technology. Any transcriptional errors that result from this process are unintentional.  Oberon Hehir M.D on 02/23/2016 at 4:01 PM  Between 7am to 6pm - Pager - [phone]  After 6pm go to [email or web address] - password [email or web address] [facility]  [facility]  Office  [phone]  CC: Primary care physician; Sherlene Shams, MD

## 2016-02-23 NOTE — Progress Notes (Addendum)
Pharmacy Antibiotic Note  Christina Ruiz is a 25 y.o. female admitted on 02/23/2016 with Fever, severe headache, sore throat and neck stiffness. Patient being treated for possible meningitis.    Pharmacy has been consulted for ceftriaxone and vancomycin dosing.  CSF showing no WBC or organisms at of 0645 02/23/16  MONO screen POSITIVE   Plan: CrCl: 4277ml/mL    Scr:  0.94    Ke: 0.068   T1/2: 10.2   VD: 42  Will start patient on vancomcyin 750mg  every 12 hours with 6 hour stack dosing. Patient received 1gm IV in ED at 0700. Calculated trough at Css 15.5. Trough ordered prior to 5th dose on 02/24/16 @ 2330.   ----Vancomcyin Discontinued due to patient experiencing itching and redness after 1st infusion---  Will start the patient on ceftriaxone 2gm every 12 hours for meningitis.   Pharmacy will continue  to monitor and adjust doses as needed.    Height: 5\' 3"  (160 cm) Weight: 133 lb 5 oz (60.47 kg) IBW/kg (Calculated) : 52.4  Temp (24hrs), Avg:101.1 F (38.4 C), Min:98.4 F (36.9 C), Max:104 F (40 C)   Recent Labs Lab 02/23/16 0441  WBC 8.2  CREATININE 0.94  LATICACIDVEN 0.7    Estimated Creatinine Clearance: 77.7 mL/min (by C-G formula based on Cr of 0.94).    No Known Allergies  Antimicrobials this admission: 06/3 Vancomycin  >>  06/3 Ceftriaxone >>   Microbiology results: 6/3 BCx: pending 6/3 UCx: pending  Thank you for allowing pharmacy to be a part of this patient's care.  Christina Ruiz, PharmD Pharmacy Resident  02/23/2016 9:27 AM

## 2016-02-24 LAB — URINE CULTURE: Culture: NO GROWTH

## 2016-02-24 LAB — BASIC METABOLIC PANEL
ANION GAP: 5 (ref 5–15)
BUN: 13 mg/dL (ref 6–20)
CALCIUM: 8.9 mg/dL (ref 8.9–10.3)
CHLORIDE: 107 mmol/L (ref 101–111)
CO2: 26 mmol/L (ref 22–32)
Creatinine, Ser: 0.86 mg/dL (ref 0.44–1.00)
GFR calc Af Amer: >60 mL/min (ref >60)
GFR calc non Af Amer: >60 mL/min (ref >60)
GLUCOSE: 140 mg/dL — AB (ref 65–99)
Potassium: 4.8 mmol/L (ref 3.5–5.1)
Sodium: 138 mmol/L (ref 135–145)

## 2016-02-24 LAB — CBC
HCT: 38.6 % (ref 35.0–47.0)
HEMOGLOBIN: 12.8 g/dL (ref 12.0–16.0)
MCH: 28.9 pg (ref 26.0–34.0)
MCHC: 33.3 g/dL (ref 32.0–36.0)
MCV: 86.9 fL (ref 80.0–100.0)
Platelets: 228 10*3/uL (ref 150–440)
RBC: 4.44 MIL/uL (ref 3.80–5.20)
RDW: 13.9 % (ref 11.5–14.5)
WBC: 8.6 10*3/uL (ref 3.6–11.0)

## 2016-02-24 LAB — HERPES SIMPLEX VIRUS(HSV) DNA BY PCR
HSV 1 DNA: NEGATIVE
HSV 2 DNA: NEGATIVE

## 2016-02-24 MED ORDER — MORPHINE SULFATE (PF) 2 MG/ML IV SOLN
INTRAVENOUS | Status: AC
  Filled 2016-02-24: qty 1

## 2016-02-24 MED ORDER — MORPHINE SULFATE (PF) 2 MG/ML IV SOLN
2.0000 mg | Freq: Once | INTRAVENOUS | Status: AC
  Administered 2016-02-24: 2 mg via INTRAVENOUS

## 2016-02-24 MED ORDER — ALPRAZOLAM 0.25 MG PO TABS
0.2500 mg | ORAL_TABLET | Freq: Three times a day (TID) | ORAL | Status: DC | PRN
  Administered 2016-02-24: 23:00:00 0.25 mg via ORAL
  Filled 2016-02-24: qty 1

## 2016-02-24 MED ORDER — OXYCODONE HCL 5 MG PO TABS
5.0000 mg | ORAL_TABLET | ORAL | Status: DC | PRN
  Administered 2016-02-24 – 2016-02-25 (×3): 5 mg via ORAL
  Filled 2016-02-24 (×4): qty 1

## 2016-02-24 MED ORDER — KETOROLAC TROMETHAMINE 30 MG/ML IJ SOLN
15.0000 mg | Freq: Four times a day (QID) | INTRAMUSCULAR | Status: DC | PRN
  Administered 2016-02-24 – 2016-02-25 (×2): 15 mg via INTRAVENOUS
  Filled 2016-02-24 (×2): qty 1

## 2016-02-24 NOTE — Progress Notes (Addendum)
Patient ID: Christina Ruiz, female   DOB: 10-28-1990, 25 y.o.   MRN: [id number] [facility] [place] at [facility]   PATIENT NAME: Christina Ruiz    MR#:  [id number]  DATE OF BIRTH:  12-04-1990  SUBJECTIVE:  Came in with fever, sore throat and neck pain Today patient is reporting decreased by mouth intake and generalized weakness with body pains. Tolerating by mouth Denies any neck stiffness  REVIEW OF SYSTEMS:   Review of Systems  Constitutional: Positive for fever. Negative for chills and weight loss.  HENT: Positive for sore throat. Negative for ear discharge, ear pain and nosebleeds.   Eyes: Negative for blurred vision, pain and discharge.  Respiratory: Negative for sputum production, shortness of breath, wheezing and stridor.   Cardiovascular: Negative for chest pain, palpitations, orthopnea and PND.  Gastrointestinal: Negative for nausea, vomiting, abdominal pain and diarrhea.  Genitourinary: Negative for urgency and frequency.  Musculoskeletal: Negative for back pain and joint pain.  Neurological: Positive for weakness and headaches. Negative for sensory change, speech change and focal weakness.  Psychiatric/Behavioral: Negative for depression and hallucinations. The patient is not nervous/anxious.   All other systems reviewed and are negative.  Tolerating Diet:yes Tolerating PT: not needed  DRUG ALLERGIES:  No Known Allergies  VITALS:  Blood pressure 95/54, pulse 54, temperature 98.3 F (36.8 C), temperature source Oral, resp. rate 18, height  (1.6 m), weight 60.47 kg (133 lb 5 oz), last menstrual period 01/25/2016, SpO2 99 %.  PHYSICAL EXAMINATION:   Physical Exam  GENERAL:  25 y.o.-year-old patient lying in the bed with no acute distress.  EYES: Pupils equal, round, reactive to light and accommodation. No scleral icterus. Extraocular muscles intact.  HEENT: Head atraumatic, normocephalic. Oropharynx and nasopharynx Erythematous, with  purulent plaques uvula midline NECK:  Supple, no jugular venous distention. No thyroid enlargement, no tenderness.  LUNGS: Normal breath sounds bilaterally, no wheezing, rales, rhonchi. No use of accessory muscles of respiration.  CARDIOVASCULAR: S1, S2 normal. No murmurs, rubs, or gallops.  ABDOMEN: Soft, nontender, nondistended. Bowel sounds present. No organomegaly or mass.  EXTREMITIES: No cyanosis, clubbing or edema b/l.    NEUROLOGIC: Cranial nerves II through XII are intact. No focal Motor or sensory deficits b/l.   PSYCHIATRIC:  patient is alert and oriented x 3.  SKIN: No obvious rash, lesion, or ulcer.   LABORATORY PANEL:  CBC  Recent Labs Lab 02/24/16 0536  WBC 8.6  HGB 12.8  HCT 38.6  PLT 228    Chemistries   Recent Labs Lab 02/23/16 0441 02/24/16 0536  NA 132* 138  K 3.3* 4.8  CL 104 107  CO2 20* 26  GLUCOSE 134* 140*  BUN 13 13  CREATININE 0.94 0.86  CALCIUM 8.7* 8.9  AST 21  --   ALT 16  --   ALKPHOS 49  --   BILITOT 0.7  --    Cardiac Enzymes  Recent Labs Lab 02/23/16 0441  TROPONINI <0.03   RADIOLOGY:  Dg Chest Port 1 View  02/23/2016  CLINICAL DATA:  Sepsis workup EXAM: PORTABLE CHEST 1 VIEW COMPARISON:  01/03/2012 FINDINGS: Normal heart size and mediastinal contours. Incidental biapical pleural thickening, stable. No infiltrate or edema. No effusion or pneumothorax. No acute osseous findings. IMPRESSION: Negative portable chest. Electronically Signed   By: Marnee Spring M.D.   On: 02/23/2016 06:17   ASSESSMENT AND PLAN:  Christina Ruiz is a 25 y.o. female who presents with Fever, severe headache, sore throat, neck  stiffness. Patient states that she developed sore throat yesterday, has been unable to eat or drink anything since that time. She then began to develop headache and neck stiffness. She states her headache is the worst she's ever had. She denies any vision changes, but does state that she has had some lightheadedness when she  stands  1.fever,headache and sore throat with positive grp a strept(as out pt) and positive infect mono [facility] -Throat cultures and urine cultures are pending -Continue po augmentin -came in with fever of 104---afebrile now. Feels better -symptomatic care -Taper IV steroids -We will consider ENT consult if no improvement clinically -Pain management with by mouth oxycodone as needed  2.Fever/headache and neck rigidity -CSF c/w viral meningitis. CSF cultures with no organisms -symptomatic care  3. DVT prophylaxis lovenox  Case discussed with Care Management/Social Worker. Management plans discussed with the patient, family and they are in agreement.  CODE STATUS:full DVT Prophylaxis: lovenox TOTAL TIME TAKING CARE OF THIS PATIENT: 30minutes.  >50% time spent on counselling and coordination of care  POSSIBLE D/C IN am DAYS, DEPENDING ON CLINICAL CONDITION.  Note: This dictation was prepared with Dragon dictation along with smaller phrase technology. Any transcriptional errors that result from this process are unintentional.  Ramonita LabGouru, Kyrese Gartman M.D on 02/24/2016 at 1:13 PM  Between 7am to 6pm - Pager - [phone]  After 6pm go to [email or web address] - password [email or web address] [facility]  [facility] [place] [facility]  Office  [phone]  CC: Primary care physician; Sherlene ShamsULLO, TERESA L, MD

## 2016-02-24 NOTE — Progress Notes (Signed)
Pt c/o of headache and throbbing sensation in head/ PRN roxicodone given with no improvement/ pt crying from pain/ Md paged to make aware/ neuro consult ordered/ and iv morphine ordered to help with pain

## 2016-02-25 ENCOUNTER — Inpatient Hospital Stay: Payer: Self-pay

## 2016-02-25 DIAGNOSIS — G4489 Other headache syndrome: Secondary | ICD-10-CM

## 2016-02-25 DIAGNOSIS — M542 Cervicalgia: Secondary | ICD-10-CM

## 2016-02-25 LAB — CULTURE, GROUP A STREP (THRC)

## 2016-02-25 LAB — BASIC METABOLIC PANEL
ANION GAP: 7 (ref 5–15)
BUN: 17 mg/dL (ref 6–20)
CHLORIDE: 104 mmol/L (ref 101–111)
CO2: 28 mmol/L (ref 22–32)
Calcium: 8.7 mg/dL — ABNORMAL LOW (ref 8.9–10.3)
Creatinine, Ser: 0.79 mg/dL (ref 0.44–1.00)
GFR calc Af Amer: >60 mL/min (ref >60)
GLUCOSE: 134 mg/dL — AB (ref 65–99)
POTASSIUM: 4.4 mmol/L (ref 3.5–5.1)
Sodium: 139 mmol/L (ref 135–145)

## 2016-02-25 LAB — CBC
HCT: 37.1 % (ref 35.0–47.0)
HEMOGLOBIN: 12.4 g/dL (ref 12.0–16.0)
MCH: 28.9 pg (ref 26.0–34.0)
MCHC: 33.3 g/dL (ref 32.0–36.0)
MCV: 86.6 fL (ref 80.0–100.0)
PLATELETS: 220 10*3/uL (ref 150–440)
RBC: 4.28 MIL/uL (ref 3.80–5.20)
RDW: 13.9 % (ref 11.5–14.5)
WBC: 6.9 10*3/uL (ref 3.6–11.0)

## 2016-02-25 MED ORDER — OXYCODONE-ACETAMINOPHEN 5-325 MG PO TABS
1.0000 | ORAL_TABLET | Freq: Four times a day (QID) | ORAL | Status: DC | PRN
  Administered 2016-02-25: 2 via ORAL
  Administered 2016-02-26: 1 via ORAL
  Filled 2016-02-25 (×2): qty 2
  Filled 2016-02-25: qty 1

## 2016-02-25 MED ORDER — DIPHENHYDRAMINE HCL 25 MG PO CAPS
25.0000 mg | ORAL_CAPSULE | Freq: Four times a day (QID) | ORAL | Status: DC | PRN
  Administered 2016-02-25: 25 mg via ORAL
  Filled 2016-02-25: qty 1

## 2016-02-25 MED ORDER — OXYCODONE HCL 5 MG PO TABS
5.0000 mg | ORAL_TABLET | Freq: Once | ORAL | Status: AC
  Administered 2016-02-25: 5 mg via ORAL

## 2016-02-25 MED ORDER — GADOBENATE DIMEGLUMINE 529 MG/ML IV SOLN
12.0000 mL | Freq: Once | INTRAVENOUS | Status: AC | PRN
  Administered 2016-02-25: 12 mL via INTRAVENOUS

## 2016-02-25 NOTE — Care Management (Signed)
Admitted to [facility] with the diagnosis of sepsis. Christina EconomyRoger Ruiz is listed as contact person [phone]). Seen Drs Christina Ruiz and Christina Ruiz in the past. Presented with decreased intake, generalized weakness, and neck stiffness. Currently with Droplet precautions for Mono infection. Neurology consult. No insurance listed. Gwenette GreetBrenda S Toi Stelly RN MSN CCM Care Management [phone]

## 2016-02-25 NOTE — Progress Notes (Signed)
Pt complaining of generalized itching. Per pt, she cant recall if this is new or has been happening all day because she has been asleep. MD Imogene Burnhen notified. Verbal order given for 25 mg Benadryl PO q6h PRN. Rn will input order, and administer.   Christina Ruiz, Lorcan Shelp M

## 2016-02-25 NOTE — Consult Note (Addendum)
Reason for Consult:Headache Referring Physician: Gouru  CC: Headache  HPI: Christina Ruiz is an 25 y.o. female who presented on 5/3 with fever, severe headache, sore throat, neck stiffness.  She denies any vision changes, but does state that she has had some lightheadedness when she stands up. She's also had some nausea, but no vomiting.  In the ED she had a fever of 104 and was tachycardic.  Concern initially was for meningitis.  LP shows protein of 21, glucose of 77, 7 rbcs, 2 wbcs.  CSF cultures have been negative.  HSV was negative as well.  Patient has continued to have headaches.  Reports they are on the right and throbbing.  Worse when lying down.  Rates at 10/10.  Has some associated nausea.    Past Medical History  Diagnosis Date  . History of Meckel's diverticulum     ? had some surgery as a child    Past Surgical History  Procedure Laterality Date  . Small intestine surgery      ?  Marland Kitchen Appendectomy      Family History  Problem Relation Age of Onset  . Bipolar disorder Mother   . Heart disease Other     Social History:  reports that she has never smoked. She has never used smokeless tobacco. She reports that she does not drink alcohol or use illicit drugs.  No Known Allergies  Medications:  I have reviewed the patient's current medications. Prior to Admission:  Prescriptions prior to admission  Medication Sig Dispense Refill Last Dose  . amoxicillin-clavulanate (AUGMENTIN) 875-125 MG tablet Take 1 tablet by mouth every 12 (twelve) hours. X 10 days.   unknown  . ferrous sulfate 325 (65 FE) MG tablet Take 325 mg by mouth daily with breakfast.     unknown  . Multiple Vitamin (MULTIVITAMIN) tablet Take 1 tablet by mouth daily.     unknown  . norethindrone-ethinyl estradiol (FEMHRT LOW DOSE) 0.5-2.5 MG-MCG per tablet Take 1 tablet by mouth daily.     unknown   Scheduled: . amoxicillin-clavulanate  1 tablet Oral Q12H  . dexamethasone  8 mg Intravenous Q6H  . enoxaparin  (LOVENOX) injection  40 mg Subcutaneous Q24H  . sodium chloride flush  3 mL Intravenous Q12H    ROS: History obtained from the patient  General ROS: negative for - chills, fatigue, fever, night sweats, weight gain or weight loss Psychological ROS: negative for - behavioral disorder, hallucinations, memory difficulties, mood swings or suicidal ideation Ophthalmic ROS: negative for - blurry vision, double vision, eye pain or loss of vision ENT ROS: negative for - epistaxis, nasal discharge, oral lesions, sore throat, tinnitus or vertigo Allergy and Immunology ROS: negative for - hives or itchy/watery eyes Hematological and Lymphatic ROS: negative for - bleeding problems, bruising or swollen lymph nodes Endocrine ROS: negative for - galactorrhea, hair pattern changes, polydipsia/polyuria or temperature intolerance Respiratory ROS: negative for - cough, hemoptysis, shortness of breath or wheezing Cardiovascular ROS: negative for - chest pain, dyspnea on exertion, edema or irregular heartbeat Gastrointestinal ROS: as noted in HPI Genito-Urinary ROS: negative for - dysuria, hematuria, incontinence or urinary frequency/urgency Musculoskeletal ROS: negative for - joint swelling or muscular weakness Neurological ROS: as noted in HPI Dermatological ROS: negative for rash and skin lesion changes  Physical Examination: Blood pressure 97/55, pulse 49, temperature 97.7 F (36.5 C), temperature source Oral, resp. rate 22, height  (1.6 m), weight 60.47 kg (133 lb 5 oz), last menstrual period 01/25/2016, SpO2 100 %.  HEENT-  Normocephalic, no lesions, without obvious abnormality.  Normal external eye and conjunctiva.  Normal TM's bilaterally.  Normal auditory canals and external ears. Normal external nose, mucus membranes and septum.  Normal pharynx. Cardiovascular- S1, S2 normal, pulses palpable throughout   Lungs- chest clear, no wheezing, rales, normal symmetric air entry Abdomen- soft,  non-tender; bowel sounds normal; no masses,  no organomegaly Extremities- no edema Lymph-no adenopathy palpable Musculoskeletal-no joint tenderness, deformity or swelling Skin-warm and dry, no hyperpigmentation, vitiligo, or suspicious lesions  Neurological Examination Mental Status: Alert, oriented, thought content appropriate.  Speech fluent without evidence of aphasia.  Able to follow 3 step commands without difficulty. Cranial Nerves: II: Discs flat bilaterally; Visual fields grossly normal, pupils equal, round, reactive to light and accommodation III,IV, VI: ptosis not present, extra-ocular motions intact bilaterally V,VII: smile symmetric, facial light touch sensation normal bilaterally VIII: hearing normal bilaterally IX,X: gag reflex present XI: bilateral shoulder shrug XII: midline tongue extension Motor: Right : Upper extremity   5/5    Left:     Upper extremity   5/5  Lower extremity   5/5     Lower extremity   5/5 Tone and bulk:normal tone throughout; no atrophy noted Sensory: Pinprick and light touch intact throughout, bilaterally Deep Tendon Reflexes: 2+ and symmetric throughout Plantars: Right: downgoing   Left: downgoing Cerebellar: Normal finger-to-nose, normal rapid alternating movements and normal heel-to-shin testing bilaterally Gait: not tested due to pain    Laboratory Studies:   Basic Metabolic Panel:  Recent Labs Lab 02/23/16 0441 02/24/16 0536 02/25/16 0601  NA 132* 138 139  K 3.3* 4.8 4.4  CL 104 107 104  CO2 20* 26 28  GLUCOSE 134* 140* 134*  BUN 13 13 17   CREATININE 0.94 0.86 0.79  CALCIUM 8.7* 8.9 8.7*    Liver Function Tests:  Recent Labs Lab 02/23/16 0441  AST 21  ALT 16  ALKPHOS 49  BILITOT 0.7  PROT 7.6  ALBUMIN 4.1    Recent Labs Lab 02/23/16 0441  LIPASE 31   No results for input(s): AMMONIA in the last 168 hours.  CBC:  Recent Labs Lab 02/23/16 0441 02/24/16 0536 02/25/16 0601  WBC 8.2 8.6 6.9  NEUTROABS  6.6*  --   --   HGB 12.4 12.8 12.4  HCT 35.8 38.6 37.1  MCV 83.6 86.9 86.6  PLT 235 228 220    Cardiac Enzymes:  Recent Labs Lab 02/23/16 0441  TROPONINI <0.03    BNP: Invalid input(s): POCBNP  CBG: No results for input(s): GLUCAP in the last 168 hours.  Microbiology: Results for orders placed or performed during the hospital encounter of 02/23/16  Blood Culture (routine x 2)     Status: None (Preliminary result)   Collection Time: 02/23/16  4:41 AM  Result Value Ref Range Status   Specimen Description BLOOD LEFT FOREARM  Final   Special Requests   Final    BOTTLES DRAWN AEROBIC AND ANAEROBIC 10CCAERO,10CCANA   Culture NO GROWTH 1 DAY  Final   Report Status PENDING  Incomplete  Blood Culture (routine x 2)     Status: None (Preliminary result)   Collection Time: 02/23/16  4:41 AM  Result Value Ref Range Status   Specimen Description BLOOD LEFT ASSIST CONTROL  Final   Special Requests   Final    BOTTLES DRAWN AEROBIC AND ANAEROBIC 10CCAERO,10CCANA   Culture NO GROWTH 1 DAY  Final   Report Status PENDING  Incomplete  CSF culture  Status: None (Preliminary result)   Collection Time: 02/23/16  5:40 AM  Result Value Ref Range Status   Specimen Description CSF  Final   Special Requests Normal  Final   Gram Stain   Final    RARE RED BLOOD CELLS NO WBC SEEN NO ORGANISMS SEEN    Culture NO GROWTH 1 DAY  Final   Report Status PENDING  Incomplete  Rapid Influenza A&B Antigens ([facility] only)     Status: None   Collection Time: 02/23/16  6:07 AM  Result Value Ref Range Status   Influenza A ([facility]) NEGATIVE NEGATIVE Final   Influenza B ([facility]) NEGATIVE NEGATIVE Final  Culture, group A strep     Status: None (Preliminary result)   Collection Time: 02/23/16  6:07 AM  Result Value Ref Range Status   Specimen Description THROAT  Final   Special Requests NONE  Final   Culture NO BETA HEMOLYTIC STREPTOCOCCI ISOLATED  Final   Report Status PENDING  Incomplete  Urine culture      Status: None   Collection Time: 02/23/16  7:19 AM  Result Value Ref Range Status   Specimen Description URINE, RANDOM  Final   Special Requests NONE  Final   Culture NO GROWTH Performed at [facility]   Final   Report Status 02/24/2016 FINAL  Final    Coagulation Studies:  Recent Labs  02/23/16 0441  LABPROT 15.0  INR 1.16    Urinalysis:  Recent Labs Lab 02/23/16 0719  COLORURINE YELLOW*  LABSPEC 1.013  PHURINE 5.0  GLUCOSEU NEGATIVE  HGBUR NEGATIVE  BILIRUBINUR NEGATIVE  KETONESUR NEGATIVE  PROTEINUR NEGATIVE  NITRITE NEGATIVE  LEUKOCYTESUR NEGATIVE    Lipid Panel:  No results found for: CHOL, TRIG, HDL, CHOLHDL, VLDL, LDLCALC  HgbA1C: No results found for: HGBA1C  Urine Drug Screen:  No results found for: LABOPIA, COCAINSCRNUR, LABBENZ, AMPHETMU, THCU, LABBARB  Alcohol Level: No results for input(s): ETH in the last 168 hours.  Other results: EKG: sinus tachycardia at 126 bpm.  Imaging: No results found.   Assessment/Plan: 25 year old female presenting with headache, fever, nausea and vomiting.  Fever improved but continues to have headache intermittently.  LP negative for meningitis.  Due to history doubt post LP headache.  May possibly have a viral syndrome but no evidence of viral meningitis either.  If this is the case headaches should improve slowly over time.  Will rule out CNS sentinel bleed, etc.  Neurological examination is unremarkable.    Recommendations: 1.  MRI of the brain with and without contrast 2.  MRA of the head and neck 3.  Continued analgesia for pain.       Thana Farr, MD Neurology [phone] 02/25/2016, 11:06 AM

## 2016-02-25 NOTE — Progress Notes (Signed)
Patient ID: Christina Ruiz, female   DOB: 02/10/1991, 25 y.o.   MRN: [id number] [facility] [place] [facility]   PATIENT NAME: Christina Ruiz    MR#:  [id number]  DATE OF BIRTH:  02/10/1991  SUBJECTIVE:  Came in with fever, sore throat and neck pain Today patient is reporting severe headache 8-9 out of 10 , uncomfortable to go home .patient also reporting intermittent episodes of nausea but no vomiting. Denies any past medical history of migraine headaches .Denies any neck stiffness  REVIEW OF SYSTEMS:   Review of Systems  Constitutional: Negative for fever, chills and weight loss.  HENT: Positive for sore throat. Negative for ear discharge, ear pain and nosebleeds.   Eyes: Negative for blurred vision, pain and discharge.  Respiratory: Negative for sputum production, shortness of breath, wheezing and stridor.   Cardiovascular: Negative for chest pain, palpitations, orthopnea and PND.  Gastrointestinal: Negative for nausea, vomiting, abdominal pain and diarrhea.  Genitourinary: Negative for urgency and frequency.  Musculoskeletal: Negative for back pain and joint pain.  Neurological: Positive for weakness and headaches. Negative for sensory change, speech change and focal weakness.  Psychiatric/Behavioral: Negative for depression and hallucinations. The patient is not nervous/anxious.   All other systems reviewed and are negative.  Tolerating Diet:yes Tolerating PT: not needed  DRUG ALLERGIES:  No Known Allergies  VITALS:  Blood pressure 106/63, pulse 53, temperature 98.2 F (36.8 C), temperature source Oral, resp. rate 18, height 5\' 3"  (1.6 m), weight 60.47 kg (133 lb 5 oz), last menstrual period 01/25/2016, SpO2 98 %.  PHYSICAL EXAMINATION:   Physical Exam  GENERAL:  25 y.o.-year-old patient lying in the bed with no acute distress.  EYES: Pupils equal, round, reactive to light and accommodation. No scleral icterus. Extraocular muscles intact.   HEENT: Head atraumatic, normocephalic. Oropharynx and nasopharynx Erythematous, with purulent plaques uvula midline NECK:  Supple, no jugular venous distention. No thyroid enlargement, no tenderness.  LUNGS: Normal breath sounds bilaterally, no wheezing, rales, rhonchi. No use of accessory muscles of respiration.  CARDIOVASCULAR: S1, S2 normal. No murmurs, rubs, or gallops.  ABDOMEN: Soft, nontender, nondistended. Bowel sounds present. No organomegaly or mass.  EXTREMITIES: No cyanosis, clubbing or edema b/l.    NEUROLOGIC: Cranial nerves II through XII are intact. No focal Motor or sensory deficits b/l.   PSYCHIATRIC:  patient is alert and oriented x 3.  SKIN: No obvious rash, lesion, or ulcer.   LABORATORY PANEL:  CBC  Recent Labs Lab 02/25/16 0601  WBC 6.9  HGB 12.4  HCT 37.1  PLT 220    Chemistries   Recent Labs Lab 02/23/16 0441  02/25/16 0601  NA 132*  < > 139  K 3.3*  < > 4.4  CL 104  < > 104  CO2 20*  < > 28  GLUCOSE 134*  < > 134*  BUN 13  < > 17  CREATININE 0.94  < > 0.79  CALCIUM 8.7*  < > 8.7*  AST 21  --   --   ALT 16  --   --   ALKPHOS 49  --   --   BILITOT 0.7  --   --   < > = values in this interval not displayed. Cardiac Enzymes  Recent Labs Lab 02/23/16 0441  TROPONINI <0.03   RADIOLOGY:  Mr Shirlee LatchMra Head Wo Contrast  02/25/2016  CLINICAL DATA:  25 year old female who presented recently with fever (104), tachycardia, severe headache, neck stiffness, sore throat.  CSF evaluation suggesting viral meningitis. EXAM: MRI HEAD WITHOUT AND WITH CONTRAST MRA HEAD WITHOUT CONTRAST MRA NECK WITHOUT AND WITH CONTRAST TECHNIQUE: Multiplanar, multiecho pulse sequences of the brain and surrounding structures were obtained without and with intravenous contrast. Angiographic images of the Circle of Willis were obtained using MRA technique without intravenous contrast. Angiographic images of the neck were obtained using MRA technique without and with intravenous  contrast. Carotid stenosis measurements (when applicable) are obtained utilizing NASCET criteria, using the distal internal carotid diameter as the denominator. CONTRAST:  12mL MULTIHANCE GADOBENATE DIMEGLUMINE 529 MG/ML IV SOLN COMPARISON:  Cervical spine CT and Head CT without contrast 05/06/2009. FINDINGS: MRI HEAD FINDINGS Cerebral volume remains normal. No restricted diffusion to suggest acute infarction. No midline shift, mass effect, evidence of mass lesion, ventriculomegaly, extra-axial collection or acute intracranial hemorrhage. Cervicomedullary junction and pituitary are within normal limits (pituitary size and configuration normal for age and gender). Major intracranial vascular flow voids are preserved. Wallace Cullens and white matter signal is within normal limits throughout the brain. No abnormal enhancement identified. No dural thickening or abnormal meningeal enhancement. No encephalomalacia or chronic cerebral blood products. Negative visualized cervical spine Visible internal auditory structures appear normal. Mastoids are clear. Minimal to mild paranasal sinus mucosal thickening with occasional mucous retention cysts (significance doubtful. Negative orbit and scalp soft tissues. Visualized bone marrow signal is within normal limits. MRA NECK FINDINGS Precontrast time-of-flight images reveal antegrade flow in both carotid and vertebral arteries throughout the neck. The vertebral arteries are codominant. The carotid bifurcations appear normal. Postcontrast neck MRA images reveal a 3 vessel arch configuration. The cervical right carotid arteries appear normal. The cervical left carotid arteries appear normal. Normal vertebral artery origins and cervical vertebral arteries. MRA HEAD FINDINGS Antegrade flow in the posterior circulation with fairly codominant distal vertebral arteries. Normal right PICA origin. Normal bilateral AICA origins. Normal basilar artery. Normal SCA and PCA origins. Posterior  communicating arteries are diminutive or absent. Normal PCA branches. Antegrade flow in both ICA siphons. No siphon stenosis. Normal carotid termini. Normal MCA and ACA origins. Anterior communicating artery and visualized ACA branches are within normal limits. Visualized bilateral MCA branches appear normal. IMPRESSION: 1.  Normal MRI appearance of the brain. 2. Negative head and neck MRA. 3. Mild paranasal sinus inflammatory changes, significance doubtful. Electronically Signed   By: Odessa Fleming M.D.   On: 02/25/2016 14:08   Mr Angiogram Neck W Wo Contrast  02/25/2016  CLINICAL DATA:  25 year old female who presented recently with fever (104), tachycardia, severe headache, neck stiffness, sore throat. CSF evaluation suggesting viral meningitis. EXAM: MRI HEAD WITHOUT AND WITH CONTRAST MRA HEAD WITHOUT CONTRAST MRA NECK WITHOUT AND WITH CONTRAST TECHNIQUE: Multiplanar, multiecho pulse sequences of the brain and surrounding structures were obtained without and with intravenous contrast. Angiographic images of the Circle of Willis were obtained using MRA technique without intravenous contrast. Angiographic images of the neck were obtained using MRA technique without and with intravenous contrast. Carotid stenosis measurements (when applicable) are obtained utilizing NASCET criteria, using the distal internal carotid diameter as the denominator. CONTRAST:  12mL MULTIHANCE GADOBENATE DIMEGLUMINE 529 MG/ML IV SOLN COMPARISON:  Cervical spine CT and Head CT without contrast 05/06/2009. FINDINGS: MRI HEAD FINDINGS Cerebral volume remains normal. No restricted diffusion to suggest acute infarction. No midline shift, mass effect, evidence of mass lesion, ventriculomegaly, extra-axial collection or acute intracranial hemorrhage. Cervicomedullary junction and pituitary are within normal limits (pituitary size and configuration normal for age and gender). Major intracranial vascular  flow voids are preserved. Wallace Cullens and white  matter signal is within normal limits throughout the brain. No abnormal enhancement identified. No dural thickening or abnormal meningeal enhancement. No encephalomalacia or chronic cerebral blood products. Negative visualized cervical spine Visible internal auditory structures appear normal. Mastoids are clear. Minimal to mild paranasal sinus mucosal thickening with occasional mucous retention cysts (significance doubtful. Negative orbit and scalp soft tissues. Visualized bone marrow signal is within normal limits. MRA NECK FINDINGS Precontrast time-of-flight images reveal antegrade flow in both carotid and vertebral arteries throughout the neck. The vertebral arteries are codominant. The carotid bifurcations appear normal. Postcontrast neck MRA images reveal a 3 vessel arch configuration. The cervical right carotid arteries appear normal. The cervical left carotid arteries appear normal. Normal vertebral artery origins and cervical vertebral arteries. MRA HEAD FINDINGS Antegrade flow in the posterior circulation with fairly codominant distal vertebral arteries. Normal right PICA origin. Normal bilateral AICA origins. Normal basilar artery. Normal SCA and PCA origins. Posterior communicating arteries are diminutive or absent. Normal PCA branches. Antegrade flow in both ICA siphons. No siphon stenosis. Normal carotid termini. Normal MCA and ACA origins. Anterior communicating artery and visualized ACA branches are within normal limits. Visualized bilateral MCA branches appear normal. IMPRESSION: 1.  Normal MRI appearance of the brain. 2. Negative head and neck MRA. 3. Mild paranasal sinus inflammatory changes, significance doubtful. Electronically Signed   By: Odessa Fleming M.D.   On: 02/25/2016 14:08   Mr Laqueta Jean ZO Contrast  02/25/2016  CLINICAL DATA:  25 year old female who presented recently with fever (104), tachycardia, severe headache, neck stiffness, sore throat. CSF evaluation suggesting viral meningitis. EXAM:  MRI HEAD WITHOUT AND WITH CONTRAST MRA HEAD WITHOUT CONTRAST MRA NECK WITHOUT AND WITH CONTRAST TECHNIQUE: Multiplanar, multiecho pulse sequences of the brain and surrounding structures were obtained without and with intravenous contrast. Angiographic images of the Circle of Willis were obtained using MRA technique without intravenous contrast. Angiographic images of the neck were obtained using MRA technique without and with intravenous contrast. Carotid stenosis measurements (when applicable) are obtained utilizing NASCET criteria, using the distal internal carotid diameter as the denominator. CONTRAST:  12mL MULTIHANCE GADOBENATE DIMEGLUMINE 529 MG/ML IV SOLN COMPARISON:  Cervical spine CT and Head CT without contrast 05/06/2009. FINDINGS: MRI HEAD FINDINGS Cerebral volume remains normal. No restricted diffusion to suggest acute infarction. No midline shift, mass effect, evidence of mass lesion, ventriculomegaly, extra-axial collection or acute intracranial hemorrhage. Cervicomedullary junction and pituitary are within normal limits (pituitary size and configuration normal for age and gender). Major intracranial vascular flow voids are preserved. Wallace Cullens and white matter signal is within normal limits throughout the brain. No abnormal enhancement identified. No dural thickening or abnormal meningeal enhancement. No encephalomalacia or chronic cerebral blood products. Negative visualized cervical spine Visible internal auditory structures appear normal. Mastoids are clear. Minimal to mild paranasal sinus mucosal thickening with occasional mucous retention cysts (significance doubtful. Negative orbit and scalp soft tissues. Visualized bone marrow signal is within normal limits. MRA NECK FINDINGS Precontrast time-of-flight images reveal antegrade flow in both carotid and vertebral arteries throughout the neck. The vertebral arteries are codominant. The carotid bifurcations appear normal. Postcontrast neck MRA images  reveal a 3 vessel arch configuration. The cervical right carotid arteries appear normal. The cervical left carotid arteries appear normal. Normal vertebral artery origins and cervical vertebral arteries. MRA HEAD FINDINGS Antegrade flow in the posterior circulation with fairly codominant distal vertebral arteries. Normal right PICA origin. Normal bilateral AICA origins. Normal basilar artery.  Normal SCA and PCA origins. Posterior communicating arteries are diminutive or absent. Normal PCA branches. Antegrade flow in both ICA siphons. No siphon stenosis. Normal carotid termini. Normal MCA and ACA origins. Anterior communicating artery and visualized ACA branches are within normal limits. Visualized bilateral MCA branches appear normal. IMPRESSION: 1.  Normal MRI appearance of the brain. 2. Negative head and neck MRA. 3. Mild paranasal sinus inflammatory changes, significance doubtful. Electronically Signed   By: Odessa Fleming M.D.   On: 02/25/2016 14:08   ASSESSMENT AND PLAN:  Christina Ruiz is a 25 y.o. female who presents with Fever, severe headache, sore throat, neck stiffness. Patient states that she developed sore throat yesterday, has been unable to eat or drink anything since that time. She then began to develop headache and neck stiffness. She states her headache is the worst she's ever had. She denies any vision changes, but does state that she has had some lightheadedness when she stands  1.fever,headache and sore throat with positive grp a strept(as out pt) and positive infect mono [facility] -Throat cultures and urine cultures are Negative -Continue po augmentin -came in with fever of 104---afebrile now. -feels terrible from headache -symptomatic care -d/c IV steroids -Sore throat is improving, tolerating diet -Pain management with by mouth percocet as needed -MRI/MRA negative -Appreciate neurology Dr. Thad Ranger recommendations  2 headache - most likely secondary to viral prodrome DC'd IV  Decadron Pain medication changed to Percocet as per neurology recommendation -CSF c/w viral meningitis. CSF cultures with no organisms -symptomatic care  3. DVT prophylaxis lovenox  Case discussed with Care Management/Social Worker. Management plans discussed with the patient, family and they are in agreement.  CODE STATUS:full DVT Prophylaxis: lovenox TOTAL TIME TAKING CARE OF THIS PATIENT: 36 minutes.  >50% time spent on counselling and coordination of care  POSSIBLE D/C IN am DAYS, DEPENDING ON CLINICAL CONDITION.  Note: This dictation was prepared with Dragon dictation along with smaller phrase technology. Any transcriptional errors that result from this process are unintentional.  Ramonita Lab M.D on 02/25/2016 at 2:48 PM  Between 7am to 6pm - Pager - [phone]  After 6pm go to [email or web address] - password [email or web address] [facility]  [facility]  Office  [phone]  CC: Primary care physician; Sherlene Shams, MD

## 2016-02-26 ENCOUNTER — Telehealth: Payer: Self-pay | Admitting: Internal Medicine

## 2016-02-26 ENCOUNTER — Telehealth: Payer: Self-pay

## 2016-02-26 LAB — CSF CULTURE
CULTURE: NO GROWTH
SPECIAL REQUESTS: NORMAL

## 2016-02-26 LAB — CSF CULTURE W GRAM STAIN

## 2016-02-26 MED ORDER — AMOXICILLIN-POT CLAVULANATE 875-125 MG PO TABS: 1.0000 | ORAL_TABLET | Freq: Two times a day (BID) | ORAL | Status: AC

## 2016-02-26 MED ORDER — OXYCODONE-ACETAMINOPHEN 5-325 MG PO TABS: 1.0000 | ORAL_TABLET | Freq: Four times a day (QID) | ORAL | Status: DC | PRN

## 2016-02-26 MED ORDER — ACETAMINOPHEN 325 MG PO TABS
650.0000 mg | ORAL_TABLET | Freq: Four times a day (QID) | ORAL | Status: DC | PRN
Start: 2016-02-26 — End: 2018-04-06

## 2016-02-26 NOTE — Progress Notes (Signed)
Patient discharged home with grandparents via wheelchair by Clinical research associatewriter.  Bo McclintockBrewer,Uriel Horkey S, RN

## 2016-02-26 NOTE — Progress Notes (Signed)
[facility]         [place]   02/26/2016  Patient: Christina Ruiz   Date of Birth:  04-01-1991  Date of admission:  02/23/2016  Date of Discharge  02/26/2016    To Whom it May Concern:   Christina Ruiz  may return to work on 02/29/16.  PHYSICAL ACTIVITY:  Full  If you have any questions or concerns, please don't hesitate to call.  Sincerely,   Ramonita LabGouru, Laron Angelini M.D Pager [phone]- [phone] Office : [phone]   .

## 2016-02-26 NOTE — Telephone Encounter (Signed)
FYI, Pt is being discharged today. Dx is Sepsis. No appt avail to sch. Let me know where to sch. Pt needs to be seen in 1 week to 10 days. Thank you!

## 2016-02-26 NOTE — Discharge Instructions (Signed)
Activity as tolerated  Resume work from Friday 6/9  Regular diet  F/u with pcp in a week

## 2016-02-26 NOTE — Telephone Encounter (Signed)
Ok. Thank you.

## 2016-02-26 NOTE — Telephone Encounter (Signed)
Unable to reach patient.  Attempt to follow up with transitional care management. Left a voicemail to call me back.  Hospital follow appointment scheduled with PCP.  Will call again tomorrow 02/27/16.

## 2016-02-26 NOTE — Discharge Summary (Signed)
[facility] [place] [facility]   PATIENT NAME: Christina Ruiz    MR#:  [id number]  DATE OF BIRTH:  1991/07/10  DATE OF ADMISSION:  02/23/2016 ADMITTING PHYSICIAN: Oralia Manis, MD  DATE OF DISCHARGE: No discharge date for patient encounter.  PRIMARY CARE PHYSICIAN: Sherlene Shams, MD    ADMISSION DIAGNOSIS:  Meningismus [R29.1] Pharyngitis [J02.9] Sepsis, due to unspecified organism (HCC) [A41.9]  DISCHARGE DIAGNOSIS:  Sepsis Acute pharyngitis - IMN Acute viral syndrome  SECONDARY DIAGNOSIS:   Past Medical History  Diagnosis Date  . History of Meckel's diverticulum     ? had some surgery as a child    HOSPITAL COURSE:   Christina Ruiz is a 25 y.o. female who presents with Fever, severe headache, sore throat, neck stiffness. Patient states that she developed sore throat yesterday, has been unable to eat or drink anything since that time. She then began to develop headache and neck stiffness. She states her headache is the worst she's ever had. She denies any vision changes, but does state that she has had some lightheadedness when she stands  1.fever,headache and sore throat with positive grp a strept(as out pt) and positive infect mono ([facility] -Throat cultures and urine cultures are Negative -Discharged with  po augmentin -came in with fever of 104---afebrile now. -Headache significantly improved after discontinuing IV steroids --Sore throat is improving, tolerating diet -Pain management with by mouth percocet as needed -MRI/MRA negative -Appreciate neurology Dr. Thad Ranger recommendations  2 headache - most likely secondary to viral prodrome DC'd IV Decadron Pain medication changed to Percocet as per neurology recommendation -CSF c/w viral meningitis. CSF cultures with no organisms -symptomatic care  3. DVT prophylaxis lovenox  Case discussed with Care Management/Social Worker. Management plans discussed with the patient, family and  they are in agreement.  CODE STATUS:full DVT Prophylaxis: lovenox TOTAL TIME TAKING CARE OF THIS PATIENT: 36 minutes.  >50% time spent on counselling and coordination of care  POSSIBLE D/C IN am DAYS, DEPENDING ON CLINICAL CONDITION. DISCHARGE CONDITIONS:   FAIR  CONSULTS OBTAINED:  Treatment Team:  Pauletta Browns, MD   PROCEDURES  LP  DRUG ALLERGIES:  No Known Allergies  DISCHARGE MEDICATIONS:   Current Discharge Medication List    START taking these medications   Details  acetaminophen (TYLENOL) 325 MG tablet Take 2 tablets (650 mg total) by mouth every 6 (six) hours as needed for mild pain (or Fever >/= 101).    oxyCODONE-acetaminophen (PERCOCET/ROXICET) 5-325 MG tablet Take 1-2 tablets by mouth every 6 (six) hours as needed for moderate pain. Qty: 30 tablet, Refills: 0      CONTINUE these medications which have CHANGED   Details  amoxicillin-clavulanate (AUGMENTIN) 875-125 MG tablet Take 1 tablet by mouth every 12 (twelve) hours. Qty: 8 tablet, Refills: 0      CONTINUE these medications which have NOT CHANGED   Details  ferrous sulfate 325 (65 FE) MG tablet Take 325 mg by mouth daily with breakfast.      Multiple Vitamin (MULTIVITAMIN) tablet Take 1 tablet by mouth daily.      norethindrone-ethinyl estradiol (FEMHRT LOW DOSE) 0.5-2.5 MG-MCG per tablet Take 1 tablet by mouth daily.           DISCHARGE INSTRUCTIONS:   Activity as tolerated  Resume work from Friday 6/9  Regular diet  F/u with pcp in a week    DIET:  Regular diet  DISCHARGE CONDITION:  Fair  ACTIVITY:  Activity as tolerated  OXYGEN:  Home Oxygen: No.   Oxygen Delivery: room air  DISCHARGE LOCATION:  home   If you experience worsening of your admission symptoms, develop shortness of breath, life threatening emergency, suicidal or homicidal thoughts you must seek medical attention immediately by calling 911 or calling your MD immediately  if symptoms less severe.  You  Must read complete instructions/literature along with all the possible adverse reactions/side effects for all the Medicines you take and that have been prescribed to you. Take any new Medicines after you have completely understood and accpet all the possible adverse reactions/side effects.   Please note  You were cared for by a hospitalist during your hospital stay. If you have any questions about your discharge medications or the care you received while you were in the hospital after you are discharged, you can call the unit and asked to speak with the hospitalist on call if the hospitalist that took care of you is not available. Once you are discharged, your primary care physician will handle any further medical issues. Please note that NO REFILLS for any discharge medications will be authorized once you are discharged, as it is imperative that you return to your primary care physician (or establish a relationship with a primary care physician if you do not have one) for your aftercare needs so that they can reassess your need for medications and monitor your lab values.     Today  Chief Complaint  Patient presents with  . Fever   Patient is feeling fine. Headache is significantly improved tolerating diet and wants to go home  ROS:  CONSTITUTIONAL: Denies fevers, chills. Denies any fatigue, weakness.  EYES: Denies blurry vision, double vision, eye pain. EARS, NOSE, THROAT: Denies tinnitus, ear pain, hearing loss. RESPIRATORY: Denies cough, wheeze, shortness of breath.  CARDIOVASCULAR: Denies chest pain, palpitations, edema.  GASTROINTESTINAL: Denies nausea, vomiting, diarrhea, abdominal pain. Denies bright red blood per rectum. GENITOURINARY: Denies dysuria, hematuria. ENDOCRINE: Denies nocturia or thyroid problems. HEMATOLOGIC AND LYMPHATIC: Denies easy bruising or bleeding. SKIN: Denies rash or lesion. MUSCULOSKELETAL: Denies pain in neck, back, shoulder, knees, hips or arthritic  symptoms.  NEUROLOGIC: Denies paralysis, paresthesias.  PSYCHIATRIC: Denies anxiety or depressive symptoms.   VITAL SIGNS:  Blood pressure 90/55, pulse 51, temperature 97.8 F (36.6 C), temperature source Oral, resp. rate 20, height 5\' 3"  (1.6 m), weight 60.47 kg (133 lb 5 oz), last menstrual period 01/25/2016, SpO2 98 %.  I/O:  No intake or output data in the 24 hours ending 02/26/16 0920  PHYSICAL EXAMINATION:  GENERAL:  25 y.o.-year-old patient lying in the bed with no acute distress.  EYES: Pupils equal, round, reactive to light and accommodation. No scleral icterus. Extraocular muscles intact.  HEENT: Head atraumatic, normocephalic. Oropharynx and nasopharynx clear.  NECK:  Supple, no jugular venous distention. No thyroid enlargement, no tenderness.  LUNGS: Normal breath sounds bilaterally, no wheezing, rales,rhonchi or crepitation. No use of accessory muscles of respiration.  CARDIOVASCULAR: S1, S2 normal. No murmurs, rubs, or gallops.  ABDOMEN: Soft, non-tender, non-distended. Bowel sounds present. No organomegaly or mass.  EXTREMITIES: No pedal edema, cyanosis, or clubbing.  NEUROLOGIC: Cranial nerves II through XII are intact. Muscle strength 5/5 in all extremities. Sensation intact. Gait not checked.  PSYCHIATRIC: The patient is alert and oriented x 3.  SKIN: No obvious rash, lesion, or ulcer.   DATA REVIEW:   CBC  Recent Labs Lab 02/25/16 0601  WBC 6.9  HGB 12.4  HCT 37.1  PLT 220  Chemistries   Recent Labs Lab 02/23/16 0441  02/25/16 0601  NA 132*  < > 139  K 3.3*  < > 4.4  CL 104  < > 104  CO2 20*  < > 28  GLUCOSE 134*  < > 134*  BUN 13  < > 17  CREATININE 0.94  < > 0.79  CALCIUM 8.7*  < > 8.7*  AST 21  --   --   ALT 16  --   --   ALKPHOS 49  --   --   BILITOT 0.7  --   --   < > = values in this interval not displayed.  Cardiac Enzymes  Recent Labs Lab 02/23/16 0441  TROPONINI <0.03    Microbiology Results  Results for orders placed or  performed during the hospital encounter of 02/23/16  Blood Culture (routine x 2)     Status: None (Preliminary result)   Collection Time: 02/23/16  4:41 AM  Result Value Ref Range Status   Specimen Description BLOOD LEFT FOREARM  Final   Special Requests   Final    BOTTLES DRAWN AEROBIC AND ANAEROBIC 10CCAERO,10CCANA   Culture NO GROWTH 2 DAYS  Final   Report Status PENDING  Incomplete  Blood Culture (routine x 2)     Status: None (Preliminary result)   Collection Time: 02/23/16  4:41 AM  Result Value Ref Range Status   Specimen Description BLOOD LEFT ASSIST CONTROL  Final   Special Requests   Final    BOTTLES DRAWN AEROBIC AND ANAEROBIC 10CCAERO,10CCANA   Culture NO GROWTH 2 DAYS  Final   Report Status PENDING  Incomplete  CSF culture     Status: None (Preliminary result)   Collection Time: 02/23/16  5:40 AM  Result Value Ref Range Status   Specimen Description CSF  Final   Special Requests Normal  Final   Gram Stain   Final    RARE RED BLOOD CELLS NO WBC SEEN NO ORGANISMS SEEN    Culture NO GROWTH 2 DAYS  Final   Report Status PENDING  Incomplete  Rapid Influenza A&B Antigens ([facility] only)     Status: None   Collection Time: 02/23/16  6:07 AM  Result Value Ref Range Status   Influenza A ([facility]) NEGATIVE NEGATIVE Final   Influenza B ([facility]) NEGATIVE NEGATIVE Final  Culture, group A strep     Status: None   Collection Time: 02/23/16  6:07 AM  Result Value Ref Range Status   Specimen Description THROAT  Final   Special Requests NONE  Final   Culture NO BETA HEMOLYTIC STREPTOCOCCI ISOLATED  Final   Report Status 02/25/2016 FINAL  Final  Urine culture     Status: None   Collection Time: 02/23/16  7:19 AM  Result Value Ref Range Status   Specimen Description URINE, RANDOM  Final   Special Requests NONE  Final   Culture NO GROWTH Performed at [facility]   Final   Report Status 02/24/2016 FINAL  Final    RADIOLOGY:  Mr Shirlee Latch Wo Contrast  02/25/2016  CLINICAL  DATA:  25 year old female who presented recently with fever (104), tachycardia, severe headache, neck stiffness, sore throat. CSF evaluation suggesting viral meningitis. EXAM: MRI HEAD WITHOUT AND WITH CONTRAST MRA HEAD WITHOUT CONTRAST MRA NECK WITHOUT AND WITH CONTRAST TECHNIQUE: Multiplanar, multiecho pulse sequences of the brain and surrounding structures were obtained without and with intravenous contrast. Angiographic images of the Circle of Willis were obtained using  MRA technique without intravenous contrast. Angiographic images of the neck were obtained using MRA technique without and with intravenous contrast. Carotid stenosis measurements (when applicable) are obtained utilizing NASCET criteria, using the distal internal carotid diameter as the denominator. CONTRAST:  12mL MULTIHANCE GADOBENATE DIMEGLUMINE 529 MG/ML IV SOLN COMPARISON:  Cervical spine CT and Head CT without contrast 05/06/2009. FINDINGS: MRI HEAD FINDINGS Cerebral volume remains normal. No restricted diffusion to suggest acute infarction. No midline shift, mass effect, evidence of mass lesion, ventriculomegaly, extra-axial collection or acute intracranial hemorrhage. Cervicomedullary junction and pituitary are within normal limits (pituitary size and configuration normal for age and gender). Major intracranial vascular flow voids are preserved. Wallace Cullens and white matter signal is within normal limits throughout the brain. No abnormal enhancement identified. No dural thickening or abnormal meningeal enhancement. No encephalomalacia or chronic cerebral blood products. Negative visualized cervical spine Visible internal auditory structures appear normal. Mastoids are clear. Minimal to mild paranasal sinus mucosal thickening with occasional mucous retention cysts (significance doubtful. Negative orbit and scalp soft tissues. Visualized bone marrow signal is within normal limits. MRA NECK FINDINGS Precontrast time-of-flight images reveal antegrade  flow in both carotid and vertebral arteries throughout the neck. The vertebral arteries are codominant. The carotid bifurcations appear normal. Postcontrast neck MRA images reveal a 3 vessel arch configuration. The cervical right carotid arteries appear normal. The cervical left carotid arteries appear normal. Normal vertebral artery origins and cervical vertebral arteries. MRA HEAD FINDINGS Antegrade flow in the posterior circulation with fairly codominant distal vertebral arteries. Normal right PICA origin. Normal bilateral AICA origins. Normal basilar artery. Normal SCA and PCA origins. Posterior communicating arteries are diminutive or absent. Normal PCA branches. Antegrade flow in both ICA siphons. No siphon stenosis. Normal carotid termini. Normal MCA and ACA origins. Anterior communicating artery and visualized ACA branches are within normal limits. Visualized bilateral MCA branches appear normal. IMPRESSION: 1.  Normal MRI appearance of the brain. 2. Negative head and neck MRA. 3. Mild paranasal sinus inflammatory changes, significance doubtful. Electronically Signed   By: Odessa Fleming M.D.   On: 02/25/2016 14:08   Mr Angiogram Neck W Wo Contrast  02/25/2016  CLINICAL DATA:  25 year old female who presented recently with fever (104), tachycardia, severe headache, neck stiffness, sore throat. CSF evaluation suggesting viral meningitis. EXAM: MRI HEAD WITHOUT AND WITH CONTRAST MRA HEAD WITHOUT CONTRAST MRA NECK WITHOUT AND WITH CONTRAST TECHNIQUE: Multiplanar, multiecho pulse sequences of the brain and surrounding structures were obtained without and with intravenous contrast. Angiographic images of the Circle of Willis were obtained using MRA technique without intravenous contrast. Angiographic images of the neck were obtained using MRA technique without and with intravenous contrast. Carotid stenosis measurements (when applicable) are obtained utilizing NASCET criteria, using the distal internal carotid  diameter as the denominator. CONTRAST:  12mL MULTIHANCE GADOBENATE DIMEGLUMINE 529 MG/ML IV SOLN COMPARISON:  Cervical spine CT and Head CT without contrast 05/06/2009. FINDINGS: MRI HEAD FINDINGS Cerebral volume remains normal. No restricted diffusion to suggest acute infarction. No midline shift, mass effect, evidence of mass lesion, ventriculomegaly, extra-axial collection or acute intracranial hemorrhage. Cervicomedullary junction and pituitary are within normal limits (pituitary size and configuration normal for age and gender). Major intracranial vascular flow voids are preserved. Wallace Cullens and white matter signal is within normal limits throughout the brain. No abnormal enhancement identified. No dural thickening or abnormal meningeal enhancement. No encephalomalacia or chronic cerebral blood products. Negative visualized cervical spine Visible internal auditory structures appear normal. Mastoids are clear. Minimal to mild  paranasal sinus mucosal thickening with occasional mucous retention cysts (significance doubtful. Negative orbit and scalp soft tissues. Visualized bone marrow signal is within normal limits. MRA NECK FINDINGS Precontrast time-of-flight images reveal antegrade flow in both carotid and vertebral arteries throughout the neck. The vertebral arteries are codominant. The carotid bifurcations appear normal. Postcontrast neck MRA images reveal a 3 vessel arch configuration. The cervical right carotid arteries appear normal. The cervical left carotid arteries appear normal. Normal vertebral artery origins and cervical vertebral arteries. MRA HEAD FINDINGS Antegrade flow in the posterior circulation with fairly codominant distal vertebral arteries. Normal right PICA origin. Normal bilateral AICA origins. Normal basilar artery. Normal SCA and PCA origins. Posterior communicating arteries are diminutive or absent. Normal PCA branches. Antegrade flow in both ICA siphons. No siphon stenosis. Normal carotid  termini. Normal MCA and ACA origins. Anterior communicating artery and visualized ACA branches are within normal limits. Visualized bilateral MCA branches appear normal. IMPRESSION: 1.  Normal MRI appearance of the brain. 2. Negative head and neck MRA. 3. Mild paranasal sinus inflammatory changes, significance doubtful. Electronically Signed   By: Odessa Fleming M.D.   On: 02/25/2016 14:08   Mr Laqueta Jean ZO Contrast  02/25/2016  CLINICAL DATA:  25 year old female who presented recently with fever (104), tachycardia, severe headache, neck stiffness, sore throat. CSF evaluation suggesting viral meningitis. EXAM: MRI HEAD WITHOUT AND WITH CONTRAST MRA HEAD WITHOUT CONTRAST MRA NECK WITHOUT AND WITH CONTRAST TECHNIQUE: Multiplanar, multiecho pulse sequences of the brain and surrounding structures were obtained without and with intravenous contrast. Angiographic images of the Circle of Willis were obtained using MRA technique without intravenous contrast. Angiographic images of the neck were obtained using MRA technique without and with intravenous contrast. Carotid stenosis measurements (when applicable) are obtained utilizing NASCET criteria, using the distal internal carotid diameter as the denominator. CONTRAST:  12mL MULTIHANCE GADOBENATE DIMEGLUMINE 529 MG/ML IV SOLN COMPARISON:  Cervical spine CT and Head CT without contrast 05/06/2009. FINDINGS: MRI HEAD FINDINGS Cerebral volume remains normal. No restricted diffusion to suggest acute infarction. No midline shift, mass effect, evidence of mass lesion, ventriculomegaly, extra-axial collection or acute intracranial hemorrhage. Cervicomedullary junction and pituitary are within normal limits (pituitary size and configuration normal for age and gender). Major intracranial vascular flow voids are preserved. Wallace Cullens and white matter signal is within normal limits throughout the brain. No abnormal enhancement identified. No dural thickening or abnormal meningeal enhancement. No  encephalomalacia or chronic cerebral blood products. Negative visualized cervical spine Visible internal auditory structures appear normal. Mastoids are clear. Minimal to mild paranasal sinus mucosal thickening with occasional mucous retention cysts (significance doubtful. Negative orbit and scalp soft tissues. Visualized bone marrow signal is within normal limits. MRA NECK FINDINGS Precontrast time-of-flight images reveal antegrade flow in both carotid and vertebral arteries throughout the neck. The vertebral arteries are codominant. The carotid bifurcations appear normal. Postcontrast neck MRA images reveal a 3 vessel arch configuration. The cervical right carotid arteries appear normal. The cervical left carotid arteries appear normal. Normal vertebral artery origins and cervical vertebral arteries. MRA HEAD FINDINGS Antegrade flow in the posterior circulation with fairly codominant distal vertebral arteries. Normal right PICA origin. Normal bilateral AICA origins. Normal basilar artery. Normal SCA and PCA origins. Posterior communicating arteries are diminutive or absent. Normal PCA branches. Antegrade flow in both ICA siphons. No siphon stenosis. Normal carotid termini. Normal MCA and ACA origins. Anterior communicating artery and visualized ACA branches are within normal limits. Visualized bilateral MCA branches appear normal. IMPRESSION: 1.  Normal MRI appearance of the brain. 2. Negative head and neck MRA. 3. Mild paranasal sinus inflammatory changes, significance doubtful. Electronically Signed   By: Odessa Fleming M.D.   On: 02/25/2016 14:08   Dg Chest Port 1 View  02/23/2016  CLINICAL DATA:  Sepsis workup EXAM: PORTABLE CHEST 1 VIEW COMPARISON:  01/03/2012 FINDINGS: Normal heart size and mediastinal contours. Incidental biapical pleural thickening, stable. No infiltrate or edema. No effusion or pneumothorax. No acute osseous findings. IMPRESSION: Negative portable chest. Electronically Signed   By: Marnee Spring M.D.   On: 02/23/2016 06:17    EKG:   Orders placed or performed during the hospital encounter of 02/23/16  . ED EKG 12-Lead  . ED EKG 12-Lead  . EKG 12-Lead  . EKG 12-Lead      Management plans discussed with the patient, family and they are in agreement.  CODE STATUS:     Code Status Orders        Start     Ordered   02/23/16 0852  Full code   Continuous     02/23/16 0851    Code Status History    Date Active Date Inactive Code Status Order ID Comments User Context   This patient has a current code status but no historical code status.      TOTAL TIME TAKING CARE OF THIS PATIENT: 43 minutes.    @[facility] @  on 02/26/2016 at 9:20 AM  Between 7am to 6pm - Pager - [phone]  After 6pm go to [email or web address] - password [email or web address] [facility]  [facility]  Office  [phone]  CC: Primary care physician; Sherlene Shams, MD

## 2016-02-26 NOTE — Progress Notes (Signed)
Discharge instructions given and went over with patient at bedside. Prescriptions given. Work note given. Patient to be discharged home. Awaiting transportation. Bo McclintockBrewer,Toby Ayad S, RN

## 2016-02-26 NOTE — Telephone Encounter (Signed)
Appointment scheduled with PCP on 03/03/16 at 4:30.  Will follow with transitional care management.

## 2016-02-26 NOTE — Telephone Encounter (Signed)
Thank you.  Will follow up with Dr. Darrick Huntsmanullo for placement.

## 2016-02-27 ENCOUNTER — Telehealth: Payer: Self-pay

## 2016-02-27 NOTE — Telephone Encounter (Signed)
Unable to reach patient.  No answer on 2 numbers listed.  Left HIPPA compliant message on 3rd number to call me back at the office.  Appointment scheduled with PCP 03/03/13 at 4:30.  Will call again 02/28/16.

## 2016-02-28 ENCOUNTER — Encounter: Payer: Self-pay | Admitting: Family Medicine

## 2016-02-28 ENCOUNTER — Emergency Department
Admission: EM | Admit: 2016-02-28 | Discharge: 2016-02-28 | Disposition: A | Discharge status: Home / Self Care | Payer: 59 | Attending: Emergency Medicine | Admitting: Emergency Medicine

## 2016-02-28 ENCOUNTER — Encounter: Payer: Self-pay | Admitting: Emergency Medicine

## 2016-02-28 ENCOUNTER — Ambulatory Visit (INDEPENDENT_AMBULATORY_CARE_PROVIDER_SITE_OTHER): Payer: 59 | Admitting: Family Medicine

## 2016-02-28 ENCOUNTER — Ambulatory Visit: Payer: Medicaid Other | Admitting: Family Medicine

## 2016-02-28 VITALS — BP 118/80 | HR 78 | Temp 98.8°F | Ht 64.25 in | Wt 128.0 lb

## 2016-02-28 DIAGNOSIS — A419 Sepsis, unspecified organism: Secondary | ICD-10-CM | POA: Diagnosis not present

## 2016-02-28 DIAGNOSIS — R112 Nausea with vomiting, unspecified: Secondary | ICD-10-CM | POA: Diagnosis not present

## 2016-02-28 DIAGNOSIS — R519 Headache, unspecified: Secondary | ICD-10-CM

## 2016-02-28 DIAGNOSIS — R51 Headache: Secondary | ICD-10-CM | POA: Diagnosis not present

## 2016-02-28 DIAGNOSIS — F419 Anxiety disorder, unspecified: Secondary | ICD-10-CM

## 2016-02-28 LAB — COMPREHENSIVE METABOLIC PANEL
ALBUMIN: 4.4 g/dL (ref 3.5–5.0)
ALT: 60 U/L — AB (ref 14–54)
AST: 52 U/L — AB (ref 15–41)
Alkaline Phosphatase: 47 U/L (ref 38–126)
Anion gap: 10 (ref 5–15)
BILIRUBIN TOTAL: 1.1 mg/dL (ref 0.3–1.2)
BUN: 17 mg/dL (ref 6–20)
CHLORIDE: 103 mmol/L (ref 101–111)
CO2: 24 mmol/L (ref 22–32)
CREATININE: 0.99 mg/dL (ref 0.44–1.00)
Calcium: 9.3 mg/dL (ref 8.9–10.3)
GFR calc Af Amer: >60 mL/min (ref >60)
GLUCOSE: 127 mg/dL — AB (ref 65–99)
Potassium: 3.5 mmol/L (ref 3.5–5.1)
Sodium: 137 mmol/L (ref 135–145)
Total Protein: 7.9 g/dL (ref 6.5–8.1)

## 2016-02-28 LAB — CULTURE, BLOOD (ROUTINE X 2)
CULTURE: NO GROWTH
CULTURE: NO GROWTH

## 2016-02-28 LAB — CBC WITH DIFFERENTIAL/PLATELET
BASOS ABS: 0 10*3/uL (ref 0–0.1)
Basophils Relative: 1 %
Eosinophils Absolute: 0.1 10*3/uL (ref 0–0.7)
Eosinophils Relative: 1 %
HEMATOCRIT: 43.4 % (ref 35.0–47.0)
Hemoglobin: 14.7 g/dL (ref 12.0–16.0)
LYMPHS PCT: 41 %
Lymphs Abs: 2.2 10*3/uL (ref 1.0–3.6)
MCH: 28.1 pg (ref 26.0–34.0)
MCHC: 33.8 g/dL (ref 32.0–36.0)
MCV: 83.3 fL (ref 80.0–100.0)
Monocytes Absolute: 0.5 10*3/uL (ref 0.2–0.9)
Monocytes Relative: 9 %
NEUTROS ABS: 2.6 10*3/uL (ref 1.4–6.5)
Neutrophils Relative %: 48 %
PLATELETS: 275 10*3/uL (ref 150–440)
RBC: 5.21 MIL/uL — AB (ref 3.80–5.20)
RDW: 13.5 % (ref 11.5–14.5)
WBC: 5.3 10*3/uL (ref 3.6–11.0)

## 2016-02-28 MED ORDER — DIPHENHYDRAMINE HCL 50 MG/ML IJ SOLN
25.0000 mg | Freq: Once | INTRAMUSCULAR | Status: AC
  Administered 2016-02-28: 25 mg via INTRAVENOUS
  Filled 2016-02-28: qty 1

## 2016-02-28 MED ORDER — KETOROLAC TROMETHAMINE 30 MG/ML IJ SOLN
30.0000 mg | Freq: Once | INTRAMUSCULAR | Status: AC
  Administered 2016-02-28: 30 mg via INTRAVENOUS
  Filled 2016-02-28: qty 1

## 2016-02-28 MED ORDER — LORAZEPAM 1 MG PO TABS: 1.0000 mg | ORAL_TABLET | Freq: Two times a day (BID) | ORAL | Status: AC

## 2016-02-28 MED ORDER — SODIUM CHLORIDE 0.9 % IV SOLN: 1000.0000 mL | Freq: Once | INTRAVENOUS | Status: DC

## 2016-02-28 MED ORDER — PROMETHAZINE HCL 25 MG PO TABS: 25.0000 mg | ORAL_TABLET | Freq: Four times a day (QID) | ORAL | Status: DC | PRN

## 2016-02-28 MED ORDER — METOCLOPRAMIDE HCL 5 MG/ML IJ SOLN
10.0000 mg | Freq: Once | INTRAMUSCULAR | Status: AC
  Administered 2016-02-28: 10 mg via INTRAVENOUS
  Filled 2016-02-28: qty 2

## 2016-02-28 MED ORDER — LORAZEPAM 2 MG/ML IJ SOLN
0.5000 mg | Freq: Once | INTRAMUSCULAR | Status: AC
  Administered 2016-02-28: 0.5 mg via INTRAVENOUS
  Filled 2016-02-28: qty 1

## 2016-02-28 MED ORDER — SODIUM CHLORIDE 0.9 % IV SOLN
Freq: Once | INTRAVENOUS | Status: AC
  Administered 2016-02-28: 16:00:00 via INTRAVENOUS

## 2016-02-28 NOTE — ED Notes (Addendum)
Pt brought by EMS from Dr office where she was told to come to ED due to lack of improvement and worsening of symptoms. [facility] after being treated for unconfirmed viral meningitis. Pt complains of head and neck pain rating it at 10. Also, complains of weakness and n/v. Pt was started on Amoxicillin Saturday and is still currently taking it, last dose taken this morning.

## 2016-02-28 NOTE — Progress Notes (Signed)
Pre visit review using our clinic review tool, if applicable. No additional management support is needed unless otherwise documented below in the visit note. 

## 2016-02-28 NOTE — Assessment & Plan Note (Signed)
Patient recently admitted for sepsis secondary to viral meningitis per electronic medical record. Patient ill-appearing on exam today. Had recent poor by mouth intake and persistent nausea and vomiting. Also continues to have severe headache and neck pain. Given presentation and history, EMS was called and patient was directly transported to the ED for urgent evaluation/treatment.
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
## 2016-02-28 NOTE — Discharge Instructions (Signed)
General Headache Without Cause A headache is pain or discomfort felt around the head or neck area. There are many causes and types of headaches. In some cases, the cause may not be found.  HOME CARE  Managing Pain  Take over-the-counter and prescription medicines only as told by your doctor.  Lie down in a dark, quiet room when you have a headache.  If directed, apply ice to the head and neck area:  Put ice in a plastic bag.  Place a towel between your skin and the bag.  Leave the ice on for 20 minutes, 2-3 times per day.  Use a heating pad or hot shower to apply heat to the head and neck area as told by your doctor.  Keep lights dim if bright lights bother you or make your headaches worse. Eating and Drinking  Eat meals on a regular schedule.  Lessen how much alcohol you drink.  Lessen how much caffeine you drink, or stop drinking caffeine. General Instructions  Keep all follow-up visits as told by your doctor. This is important.  Keep a journal to find out if certain things bring on headaches. For example, write down:  What you eat and drink.  How much sleep you get.  Any change to your diet or medicines.  Relax by getting a massage or doing other relaxing activities.  Lessen stress.  Sit up straight. Do not tighten (tense) your muscles.  Do not use tobacco products. This includes cigarettes, chewing tobacco, or e-cigarettes. If you need help quitting, ask your doctor.  Exercise regularly as told by your doctor.  Get enough sleep. This often means 7-9 hours of sleep. GET HELP IF:  Your symptoms are not helped by medicine.  You have a headache that feels different than the other headaches.  You feel sick to your stomach (nauseous) or you throw up (vomit).  You have a fever. GET HELP RIGHT AWAY IF:   Your headache becomes really bad.  You keep throwing up.  You have a stiff neck.  You have trouble seeing.  You have trouble speaking.  You have  pain in the eye or ear.  Your muscles are weak or you lose muscle control.  You lose your balance or have trouble walking.  You feel like you will pass out (faint) or you pass out.  You have confusion.   This information is not intended to replace advice given to you by your health care provider. Make sure you discuss any questions you have with your health care provider.   Document Released: 06/17/2008 Document Revised: 05/30/2015 Document Reviewed: 01/01/2015 Elsevier Interactive Patient Education 2016 Elsevier Inc.  Nausea and Vomiting Nausea means you feel sick to your stomach. Throwing up (vomiting) is a reflex where stomach contents come out of your mouth. HOME CARE   Take medicine as told by your doctor.  Do not force yourself to eat. However, you do need to drink fluids.  If you feel like eating, eat a normal diet as told by your doctor.  Eat rice, wheat, potatoes, bread, lean meats, yogurt, fruits, and vegetables.  Avoid high-fat foods.  Drink enough fluids to keep your pee (urine) clear or pale yellow.  Ask your doctor how to replace body fluid losses (rehydrate). Signs of body fluid loss (dehydration) include:  Feeling very thirsty.  Dry lips and mouth.  Feeling dizzy.  Dark pee.  Peeing less than normal.  Feeling confused.  Fast breathing or heart rate. GET HELP RIGHT AWAY  IF:   You have blood in your throw up.  You have black or bloody poop (stool).  You have a bad headache or stiff neck.  You feel confused.  You have bad belly (abdominal) pain.  You have chest pain or trouble breathing.  You do not pee at least once every 8 hours.  You have cold, clammy skin.  You keep throwing up after 24 to 48 hours.  You have a fever. MAKE SURE YOU:   Understand these instructions.  Will watch your condition.  Will get help right away if you are not doing well or get worse.   This information is not intended to replace advice given to you by  your health care provider. Make sure you discuss any questions you have with your health care provider.   Document Released: 02/25/2008 Document Revised: 12/01/2011 Document Reviewed: 02/07/2011 Elsevier Interactive Patient Education 2016 Elsevier Inc.  Panic Attacks Panic attacks are sudden, short feelings of great fear or discomfort. You may have them for no reason when you are relaxed, when you are uneasy (anxious), or when you are sleeping.  HOME CARE  Take all your medicines as told.  Check with your doctor before starting new medicines.  Keep all doctor visits. GET HELP IF:  You are not able to take your medicines as told.  Your symptoms do not get better.  Your symptoms get worse. GET HELP RIGHT AWAY IF:  Your attacks seem different than your normal attacks.  You have thoughts about hurting yourself or others.  You take panic attack medicine and you have a side effect. MAKE SURE YOU:  Understand these instructions.  Will watch your condition.  Will get help right away if you are not doing well or get worse.   This information is not intended to replace advice given to you by your health care provider. Make sure you discuss any questions you have with your health care provider.   Document Released: 10/11/2010 Document Revised: 06/29/2013 Document Reviewed: 04/22/2013 Elsevier Interactive Patient Education [employer]2016 Elsevier Inc.
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
## 2016-03-03 ENCOUNTER — Ambulatory Visit: Payer: Medicaid Other | Admitting: Internal Medicine

## 2016-03-04 LAB — CULTURE, BLOOD (ROUTINE X 2)
CULTURE: NO GROWTH
Culture: NO GROWTH

## 2016-03-07 LAB — BLOOD GAS, VENOUS
PH VEN: 7.54 — AB (ref 7.320–7.430)
Patient temperature: 37
pCO2, Ven: 31 mmHg — ABNORMAL LOW (ref 44.0–60.0)

## 2016-07-27 ENCOUNTER — Encounter: Payer: Self-pay | Admitting: Emergency Medicine

## 2016-07-27 ENCOUNTER — Emergency Department
Admission: EM | Admit: 2016-07-27 | Discharge: 2016-07-27 | Disposition: A | Discharge status: Home / Self Care | Payer: 59 | Attending: Emergency Medicine | Admitting: Emergency Medicine

## 2016-07-27 ENCOUNTER — Ambulatory Visit
Admission: EM | Admit: 2016-07-27 | Discharge: 2016-07-27 | Disposition: A | Discharge status: Home / Self Care | Payer: No Typology Code available for payment source | Source: Ambulatory Visit | Attending: Emergency Medicine | Admitting: Emergency Medicine

## 2016-07-27 DIAGNOSIS — S60211A Contusion of right wrist, initial encounter: Secondary | ICD-10-CM | POA: Diagnosis not present

## 2016-07-27 DIAGNOSIS — T7421XA Adult sexual abuse, confirmed, initial encounter: Secondary | ICD-10-CM | POA: Insufficient documentation

## 2016-07-27 DIAGNOSIS — S60212A Contusion of left wrist, initial encounter: Secondary | ICD-10-CM | POA: Diagnosis not present

## 2016-07-27 DIAGNOSIS — Z79899 Other long term (current) drug therapy: Secondary | ICD-10-CM | POA: Diagnosis not present

## 2016-07-27 DIAGNOSIS — S6992XA Unspecified injury of left wrist, hand and finger(s), initial encounter: Secondary | ICD-10-CM | POA: Diagnosis present

## 2016-07-27 DIAGNOSIS — Y999 Unspecified external cause status: Secondary | ICD-10-CM | POA: Insufficient documentation

## 2016-07-27 DIAGNOSIS — Y9389 Activity, other specified: Secondary | ICD-10-CM | POA: Diagnosis not present

## 2016-07-27 DIAGNOSIS — X58XXXA Exposure to other specified factors, initial encounter: Secondary | ICD-10-CM | POA: Insufficient documentation

## 2016-07-27 DIAGNOSIS — Y9259 Other trade areas as the place of occurrence of the external cause: Secondary | ICD-10-CM | POA: Diagnosis not present

## 2016-07-27 DIAGNOSIS — T07XXXA Unspecified multiple injuries, initial encounter: Secondary | ICD-10-CM

## 2016-07-27 DIAGNOSIS — Z0441 Encounter for examination and observation following alleged adult rape: Secondary | ICD-10-CM | POA: Diagnosis not present

## 2016-07-27 MED ORDER — CEFTRIAXONE SODIUM 250 MG IJ SOLR
INTRAMUSCULAR | Status: DC
Start: 2016-07-27 — End: 2016-07-28
  Filled 2016-07-27: qty 250

## 2016-07-27 MED ORDER — AZITHROMYCIN 500 MG PO TABS
ORAL_TABLET | ORAL | Status: AC
  Filled 2016-07-27: qty 2

## 2016-07-27 MED ORDER — METRONIDAZOLE 500 MG PO TABS
2000.0000 mg | ORAL_TABLET | Freq: Once | ORAL | Status: AC
  Administered 2016-07-27: 2000 mg via ORAL

## 2016-07-27 MED ORDER — CEFTRIAXONE SODIUM 250 MG IJ SOLR
250.0000 mg | Freq: Once | INTRAMUSCULAR | Status: AC
  Administered 2016-07-27: 250 mg via INTRAMUSCULAR

## 2016-07-27 MED ORDER — LIDOCAINE HCL (PF) 1 % IJ SOLN
0.9000 mL | Freq: Once | INTRAMUSCULAR | Status: AC
  Administered 2016-07-27: 0.9 mL

## 2016-07-27 MED ORDER — AZITHROMYCIN 500 MG PO TABS
1000.0000 mg | ORAL_TABLET | Freq: Once | ORAL | Status: AC
  Administered 2016-07-27: 1000 mg via ORAL

## 2016-07-27 MED ORDER — LIDOCAINE HCL (PF) 1 % IJ SOLN
INTRAMUSCULAR | Status: AC
  Filled 2016-07-27: qty 5

## 2016-07-27 MED ORDER — IBUPROFEN 400 MG PO TABS
400.0000 mg | ORAL_TABLET | Freq: Once | ORAL | Status: AC
  Administered 2016-07-27: 400 mg via ORAL
  Filled 2016-07-27: qty 1

## 2016-07-27 MED ORDER — ULIPRISTAL ACETATE 30 MG PO TABS
ORAL_TABLET | ORAL | Status: DC
Start: 2016-07-27 — End: 2016-07-28
  Filled 2016-07-27: qty 1

## 2016-07-27 MED ORDER — ULIPRISTAL ACETATE 30 MG PO TABS: 30.0000 mg | ORAL_TABLET | Freq: Once | ORAL | Status: DC

## 2016-07-27 MED ORDER — PROMETHAZINE HCL 25 MG PO TABS: 25.0000 mg | ORAL_TABLET | Freq: Four times a day (QID) | ORAL | Status: DC | PRN
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
## 2016-07-27 NOTE — ED Notes (Signed)
SANE nurse at bedside for assessment
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
## 2016-07-27 NOTE — Consult Note (Signed)
In patient's room to introduce myself. [facility] at bedside. Officers state they will be finished in approximately 40 minutes. Awaiting medical clearance at this time.

## 2016-07-27 NOTE — ED Triage Notes (Signed)
When asked about last period, pt states " I think now, there was blood last night but none now".  Last full period was 07/01/16

## 2016-07-28 LAB — POCT PREGNANCY, URINE: Preg Test, Ur: NEGATIVE
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
## 2017-07-07 LAB — OB RESULTS CONSOLE ABO/RH: RH Type: POSITIVE

## 2017-07-07 LAB — OB RESULTS CONSOLE ANTIBODY SCREEN: ANTIBODY SCREEN: NEGATIVE

## 2017-07-07 LAB — OB RESULTS CONSOLE RUBELLA ANTIBODY, IGM: RUBELLA: IMMUNE

## 2017-07-07 LAB — OB RESULTS CONSOLE HIV ANTIBODY (ROUTINE TESTING): HIV: NONREACTIVE

## 2017-07-07 LAB — OB RESULTS CONSOLE RPR: RPR: NONREACTIVE

## 2017-09-22 NOTE — L&D Delivery Note (Addendum)
Delivery Note   Christina Ruiz is a 27 y.o. G1P0 at 5542w4d Estimated Date of Delivery: 02/21/18  PRE-OPERATIVE DIAGNOSIS:  1) 6842w4d pregnancy.   POST-OPERATIVE DIAGNOSIS:  1) 3242w4d pregnancy s/p Vaginal, Spontaneous   Delivery Type: Vaginal, Spontaneous    Delivery Anesthesia: Epidural   Labor Complications:   None    ESTIMATED BLOOD LOSS: 250  ml    FINDINGS:   1) female infant, Apgar scores of 8    at 1 minute and 9    at 5 minutes and a birthweight pending 2) Nuchal cord: no  SPECIMENS:   PLACENTA:   Appearance: Intact , 3 vessels   Removal: Spontaneous      Disposition:   held per protocol then discarded  DISPOSITION:  Infant to left in stable condition in the delivery room, with L&D personnel and mother,  NARRATIVE SUMMARY: Labor course:  Ms. Christina RoersRachel Ruiz is a G1P0 at 2842w4d who presented for induction of labor.  She progressed well in labor with cytotec and AROM.  She received the appropriate epidural anesthesia and proceeded to complete dilation. She evidenced good maternal expulsive effort during the second stage. She went on to deliver a viable female infant " Jedadiah". The placenta delivered without problems and was noted to be complete. A perineal and vaginal examination was performed. Lacerations:   2nd degree. Lacerations was repaired with 3-0 Vicryl Rapide suture. The patient tolerated this well.  Doreene Burkennie Ghassan Coggeshall, CNM  02/25/2018 2:49 AM

## 2018-01-22 ENCOUNTER — Other Ambulatory Visit: Payer: Self-pay | Admitting: Obstetrics and Gynecology

## 2018-02-03 ENCOUNTER — Ambulatory Visit (INDEPENDENT_AMBULATORY_CARE_PROVIDER_SITE_OTHER): Admitting: Obstetrics and Gynecology

## 2018-02-03 ENCOUNTER — Encounter: Payer: Self-pay | Admitting: Obstetrics and Gynecology

## 2018-02-03 VITALS — BP 110/78 | HR 98 | Wt 151.5 lb

## 2018-02-03 DIAGNOSIS — Z130 Encounter for screening for diseases of the blood and blood-forming organs and certain disorders involving the immune mechanism: Secondary | ICD-10-CM

## 2018-02-03 DIAGNOSIS — Z3493 Encounter for supervision of normal pregnancy, unspecified, third trimester: Secondary | ICD-10-CM

## 2018-02-03 DIAGNOSIS — Z3685 Encounter for antenatal screening for Streptococcus B: Secondary | ICD-10-CM

## 2018-02-03 DIAGNOSIS — Z113 Encounter for screening for infections with a predominantly sexual mode of transmission: Secondary | ICD-10-CM

## 2018-02-03 NOTE — Patient Instructions (Signed)
Breastfeeding Choosing to breastfeed is one of the best decisions you can make for yourself and your baby. A change in hormones during pregnancy causes your breasts to make breast milk in your milk-producing glands. Hormones prevent breast milk from being released before your baby is born. They also prompt milk flow after birth. Once breastfeeding has begun, thoughts of your baby, as well as his or her sucking or crying, can stimulate the release of milk from your milk-producing glands. Benefits of breastfeeding Research shows that breastfeeding offers many health benefits for infants and mothers. It also offers a cost-free and convenient way to feed your baby. For your baby  Your first milk (colostrum) helps your baby's digestive system to function better.  Special cells in your milk (antibodies) help your baby to fight off infections.  Breastfed babies are less likely to develop asthma, allergies, obesity, or type 2 diabetes. They are also at lower risk for sudden infant death syndrome (SIDS).  Nutrients in breast milk are better able to meet your baby's needs compared to infant formula.  Breast milk improves your baby's brain development. For you  Breastfeeding helps to create a very special bond between you and your baby.  Breastfeeding is convenient. Breast milk costs nothing and is always available at the correct temperature.  Breastfeeding helps to burn calories. It helps you to lose the weight that you gained during pregnancy.  Breastfeeding makes your uterus return faster to its size before pregnancy. It also slows bleeding (lochia) after you give birth.  Breastfeeding helps to lower your risk of developing type 2 diabetes, osteoporosis, rheumatoid arthritis, cardiovascular disease, and breast, ovarian, uterine, and endometrial cancer later in life. Breastfeeding basics Starting breastfeeding  Find a comfortable place to sit or lie down, with your neck and back  well-supported.  Place a pillow or a rolled-up blanket under your baby to bring him or her to the level of your breast (if you are seated). Nursing pillows are specially designed to help support your arms and your baby while you breastfeed.  Make sure that your baby's tummy (abdomen) is facing your abdomen.  Gently massage your breast. With your fingertips, massage from the outer edges of your breast inward toward the nipple. This encourages milk flow. If your milk flows slowly, you may need to continue this action during the feeding.  Support your breast with 4 fingers underneath and your thumb above your nipple (make the letter "C" with your hand). Make sure your fingers are well away from your nipple and your baby's mouth.  Stroke your baby's lips gently with your finger or nipple.  When your baby's mouth is open wide enough, quickly bring your baby to your breast, placing your entire nipple and as much of the areola as possible into your baby's mouth. The areola is the colored area around your nipple. ? More areola should be visible above your baby's upper lip than below the lower lip. ? Your baby's lips should be opened and extended outward (flanged) to ensure an adequate, comfortable latch. ? Your baby's tongue should be between his or her lower gum and your breast.  Make sure that your baby's mouth is correctly positioned around your nipple (latched). Your baby's lips should create a seal on your breast and be turned out (everted).  It is common for your baby to suck about 2-3 minutes in order to start the flow of breast milk. Latching Teaching your baby how to latch onto your breast properly is very  important. An improper latch can cause nipple pain, decreased milk supply, and poor weight gain in your baby. Also, if your baby is not latched onto your nipple properly, he or she may swallow some air during feeding. This can make your baby fussy. Burping your baby when you switch breasts  during the feeding can help to get rid of the air. However, teaching your baby to latch on properly is still the best way to prevent fussiness from swallowing air while breastfeeding. Signs that your baby has successfully latched onto your nipple  Silent tugging or silent sucking, without causing you pain. Infant's lips should be extended outward (flanged).  Swallowing heard between every 3-4 sucks once your milk has started to flow (after your let-down milk reflex occurs).  Muscle movement above and in front of his or her ears while sucking.  Signs that your baby has not successfully latched onto your nipple  Sucking sounds or smacking sounds from your baby while breastfeeding.  Nipple pain.  If you think your baby has not latched on correctly, slip your finger into the corner of your baby's mouth to break the suction and place it between your baby's gums. Attempt to start breastfeeding again. Signs of successful breastfeeding Signs from your baby  Your baby will gradually decrease the number of sucks or will completely stop sucking.  Your baby will fall asleep.  Your baby's body will relax.  Your baby will retain a small amount of milk in his or her mouth.  Your baby will let go of your breast by himself or herself.  Signs from you  Breasts that have increased in firmness, weight, and size 1-3 hours after feeding.  Breasts that are softer immediately after breastfeeding.  Increased milk volume, as well as a change in milk consistency and color by the fifth day of breastfeeding.  Nipples that are not sore, cracked, or bleeding.  Signs that your baby is getting enough milk  Wetting at least 1-2 diapers during the first 24 hours after birth.  Wetting at least 5-6 diapers every 24 hours for the first week after birth. The urine should be clear or pale yellow by the age of 5 days.  Wetting 6-8 diapers every 24 hours as your baby continues to grow and develop.  At least 3  stools in a 24-hour period by the age of 5 days. The stool should be soft and yellow.  At least 3 stools in a 24-hour period by the age of 7 days. The stool should be seedy and yellow.  No loss of weight greater than 10% of birth weight during the first 3 days of life.  Average weight gain of 4-7 oz (113-198 g) per week after the age of 4 days.  Consistent daily weight gain by the age of 5 days, without weight loss after the age of 2 weeks. After a feeding, your baby may spit up a small amount of milk. This is normal. Breastfeeding frequency and duration Frequent feeding will help you make more milk and can prevent sore nipples and extremely full breasts (breast engorgement). Breastfeed when you feel the need to reduce the fullness of your breasts or when your baby shows signs of hunger. This is called "breastfeeding on demand." Signs that your baby is hungry include:  Increased alertness, activity, or restlessness.  Movement of the head from side to side.  Opening of the mouth when the corner of the mouth or cheek is stroked (rooting).  Increased sucking sounds,  smacking lips, cooing, sighing, or squeaking.  Hand-to-mouth movements and sucking on fingers or hands.  Fussing or crying.  Avoid introducing a pacifier to your baby in the first 4-6 weeks after your baby is born. After this time, you may choose to use a pacifier. Research has shown that pacifier use during the first year of a baby's life decreases the risk of sudden infant death syndrome (SIDS). Allow your baby to feed on each breast as long as he or she wants. When your baby unlatches or falls asleep while feeding from the first breast, offer the second breast. Because newborns are often sleepy in the first few weeks of life, you may need to awaken your baby to get him or her to feed. Breastfeeding times will vary from baby to baby. However, the following rules can serve as a guide to help you make sure that your baby is  properly fed:  Newborns (babies 47 weeks of age or younger) may breastfeed every 1-3 hours.  Newborns should not go without breastfeeding for longer than 3 hours during the day or 5 hours during the night.  You should breastfeed your baby a minimum of 8 times in a 24-hour period.  Breast milk pumping Pumping and storing breast milk allows you to make sure that your baby is exclusively fed your breast milk, even at times when you are unable to breastfeed. This is especially important if you go back to work while you are still breastfeeding, or if you are not able to be present during feedings. Your lactation consultant can help you find a method of pumping that works best for you and give you guidelines about how long it is safe to store breast milk. Caring for your breasts while you breastfeed Nipples can become dry, cracked, and sore while breastfeeding. The following recommendations can help keep your breasts moisturized and healthy:  Avoid using soap on your nipples.  Wear a supportive bra designed especially for nursing. Avoid wearing underwire-style bras or extremely tight bras (sports bras).  Air-dry your nipples for 3-4 minutes after each feeding.  Use only cotton bra pads to absorb leaked breast milk. Leaking of breast milk between feedings is normal.  Use lanolin on your nipples after breastfeeding. Lanolin helps to maintain your skin's normal moisture barrier. Pure lanolin is not harmful (not toxic) to your baby. You may also hand express a few drops of breast milk and gently massage that milk into your nipples and allow the milk to air-dry.  In the first few weeks after giving birth, some women experience breast engorgement. Engorgement can make your breasts feel heavy, warm, and tender to the touch. Engorgement peaks within 3-5 days after you give birth. The following recommendations can help to ease engorgement:  Completely empty your breasts while breastfeeding or pumping. You  may want to start by applying warm, moist heat (in the shower or with warm, water-soaked hand towels) just before feeding or pumping. This increases circulation and helps the milk flow. If your baby does not completely empty your breasts while breastfeeding, pump any extra milk after he or she is finished.  Apply ice packs to your breasts immediately after breastfeeding or pumping, unless this is too uncomfortable for you. To do this: ? Put ice in a plastic bag. ? Place a towel between your skin and the bag. ? Leave the ice on for 20 minutes, 2-3 times a day.  Make sure that your baby is latched on and positioned properly while  breastfeeding.  If engorgement persists after 48 hours of following these recommendations, contact your health care provider or a Advertising copywriter. Overall health care recommendations while breastfeeding  Eat 3 healthy meals and 3 snacks every day. Well-nourished mothers who are breastfeeding need an additional 450-500 calories a day. You can meet this requirement by increasing the amount of a balanced diet that you eat.  Drink enough water to keep your urine pale yellow or clear.  Rest often, relax, and continue to take your prenatal vitamins to prevent fatigue, stress, and low vitamin and mineral levels in your body (nutrient deficiencies).  Do not use any products that contain nicotine or tobacco, such as cigarettes and e-cigarettes. Your baby may be harmed by chemicals from cigarettes that pass into breast milk and exposure to secondhand smoke. If you need help quitting, ask your health care provider.  Avoid alcohol.  Do not use illegal drugs or marijuana.  Talk with your health care provider before taking any medicines. These include over-the-counter and prescription medicines as well as vitamins and herbal supplements. Some medicines that may be harmful to your baby can pass through breast milk.  It is possible to become pregnant while breastfeeding. If  birth control is desired, ask your health care provider about options that will be safe while breastfeeding your baby. Where to find more information: [employer]: www.llli.org Contact a health care provider if:  You feel like you want to stop breastfeeding or have become frustrated with breastfeeding.  Your nipples are cracked or bleeding.  Your breasts are red, tender, or warm.  You have: ? Painful breasts or nipples. ? A swollen area on either breast. ? A fever or chills. ? Nausea or vomiting. ? Drainage other than breast milk from your nipples.  Your breasts do not become full before feedings by the fifth day after you give birth.  You feel sad and depressed.  Your baby is: ? Too sleepy to eat well. ? Having trouble sleeping. ? More than 8 week old and wetting fewer than 6 diapers in a 24-hour period. ? Not gaining weight by 67 days of age.  Your baby has fewer than 3 stools in a 24-hour period.  Your baby's skin or the white parts of his or her eyes become yellow. Get help right away if:  Your baby is overly tired (lethargic) and does not want to wake up and feed.  Your baby develops an unexplained fever. Summary  Breastfeeding offers many health benefits for infant and mothers.  Try to breastfeed your infant when he or she shows early signs of hunger.  Gently tickle or stroke your baby's lips with your finger or nipple to allow the baby to open his or her mouth. Bring the baby to your breast. Make sure that much of the areola is in your baby's mouth. Offer one side and burp the baby before you offer the other side.  Talk with your health care provider or lactation consultant if you have questions or you face problems as you breastfeed. This information is not intended to replace advice given to you by your health care provider. Make sure you discuss any questions you have with your health care provider. Document Released: 09/08/2005 Document  Revised: 10/10/2016 Document Reviewed: 10/10/2016 Elsevier Interactive Patient Education  2018 [employer] of the uterus can occur throughout pregnancy, but they are not always a sign that you are in labor. You may have practice contractions called  Braxton Hicks contractions. These false labor contractions are sometimes confused with true labor. What are Deberah Pelton contractions? Braxton Hicks contractions are tightening movements that occur in the muscles of the uterus before labor. Unlike true labor contractions, these contractions do not result in opening (dilation) and thinning of the cervix. Toward the end of pregnancy (32-34 weeks), Braxton Hicks contractions can happen more often and may become stronger. These contractions are sometimes difficult to tell apart from true labor because they can be very uncomfortable. You should not feel embarrassed if you go to the hospital with false labor. Sometimes, the only way to tell if you are in true labor is for your health care provider to look for changes in the cervix. The health care provider will do a physical exam and may monitor your contractions. If you are not in true labor, the exam should show that your cervix is not dilating and your water has not broken. If there are other health problems associated with your pregnancy, it is completely safe for you to be sent home with false labor. You may continue to have Braxton Hicks contractions until you go into true labor. How to tell the difference between true labor and false labor True labor  Contractions last 30-70 seconds.  Contractions become very regular.  Discomfort is usually felt in the top of the uterus, and it spreads to the lower abdomen and low back.  Contractions do not go away with walking.  Contractions usually become more intense and increase in frequency.  The cervix dilates and gets thinner. False labor  Contractions are usually  shorter and not as strong as true labor contractions.  Contractions are usually irregular.  Contractions are often felt in the front of the lower abdomen and in the groin.  Contractions may go away when you walk around or change positions while lying down.  Contractions get weaker and are shorter-lasting as time goes on.  The cervix usually does not dilate or become thin. Follow these instructions at home:  Take over-the-counter and prescription medicines only as told by your health care provider.  Keep up with your usual exercises and follow other instructions from your health care provider.  Eat and drink lightly if you think you are going into labor.  If Braxton Hicks contractions are making you uncomfortable: ? Change your position from lying down or resting to walking, or change from walking to resting. ? Sit and rest in a tub of warm water. ? Drink enough fluid to keep your urine pale yellow. Dehydration may cause these contractions. ? Do slow and deep breathing several times an hour.  Keep all follow-up prenatal visits as told by your health care provider. This is important. Contact a health care provider if:  You have a fever.  You have continuous pain in your abdomen. Get help right away if:  Your contractions become stronger, more regular, and closer together.  You have fluid leaking or gushing from your vagina.  You pass blood-tinged mucus (bloody show).  You have bleeding from your vagina.  You have low back pain that you never had before.  You feel your baby's head pushing down and causing pelvic pressure.  Your baby is not moving inside you as much as it used to. Summary  Contractions that occur before labor are called Braxton Hicks contractions, false labor, or practice contractions.  Braxton Hicks contractions are usually shorter, weaker, farther apart, and less regular than true labor contractions. True labor contractions usually become  progressively  stronger and regular and they become more frequent.  Manage discomfort from [facility] contractions by changing position, resting in a warm bath, drinking plenty of water, or practicing deep breathing. This information is not intended to replace advice given to you by your health care provider. Make sure you discuss any questions you have with your health care provider. Document Released: 01/22/2017 Document Revised: 01/22/2017 Document Reviewed: 01/22/2017 Elsevier Interactive Patient Education  2018 [employer].

## 2018-02-03 NOTE — Progress Notes (Signed)
NOB transfer from [facility], pt is having pelvic pressure and low back pain

## 2018-02-03 NOTE — Progress Notes (Signed)
Transfer OB- cultures obtained and labs for anemia and vitD def. Obtained. Labor precautions discussed. 2 friends for labor support. Prenatal records reviewed.

## 2018-02-04 LAB — CBC
HEMOGLOBIN: 10 g/dL — AB (ref 11.1–15.9)
Hematocrit: 31.8 % — ABNORMAL LOW (ref 34.0–46.6)
MCH: 27 pg (ref 26.6–33.0)
MCHC: 31.4 g/dL — ABNORMAL LOW (ref 31.5–35.7)
MCV: 86 fL (ref 79–97)
Platelets: 296 10*3/uL (ref 150–379)
RBC: 3.71 x10E6/uL — AB (ref 3.77–5.28)
RDW: 12.8 % (ref 12.3–15.4)
WBC: 8.1 10*3/uL (ref 3.4–10.8)

## 2018-02-04 LAB — VITAMIN B12: Vitamin B-12: 442 pg/mL (ref 232–1245)

## 2018-02-04 LAB — VITAMIN D 25 HYDROXY (VIT D DEFICIENCY, FRACTURES): Vit D, 25-Hydroxy: 37.3 ng/mL (ref 30.0–100.0)

## 2018-02-05 LAB — STREP GP B NAA: Strep Gp B NAA: NEGATIVE

## 2018-02-05 LAB — GC/CHLAMYDIA PROBE AMP
CHLAMYDIA, DNA PROBE: NEGATIVE
Neisseria gonorrhoeae by PCR: NEGATIVE

## 2018-02-10 ENCOUNTER — Ambulatory Visit (INDEPENDENT_AMBULATORY_CARE_PROVIDER_SITE_OTHER): Admitting: Obstetrics and Gynecology

## 2018-02-10 VITALS — BP 122/83 | HR 78 | Wt 151.4 lb

## 2018-02-10 DIAGNOSIS — Z3493 Encounter for supervision of normal pregnancy, unspecified, third trimester: Secondary | ICD-10-CM

## 2018-02-10 LAB — POCT URINALYSIS DIPSTICK
BILIRUBIN UA: NEGATIVE
Glucose, UA: NEGATIVE
Ketones, UA: NEGATIVE
Leukocytes, UA: NEGATIVE
Nitrite, UA: NEGATIVE
PH UA: 6 (ref 5.0–8.0)
Protein, UA: NEGATIVE
RBC UA: NEGATIVE
SPEC GRAV UA: 1.01 (ref 1.010–1.025)
UROBILINOGEN UA: 0.2 U/dL

## 2018-02-10 NOTE — Progress Notes (Signed)
ROB- pt is having some pelvic pressure, low back pain 

## 2018-02-10 NOTE — Progress Notes (Signed)
ROB- doing well, labor precautions discussed.  

## 2018-02-17 ENCOUNTER — Ambulatory Visit (INDEPENDENT_AMBULATORY_CARE_PROVIDER_SITE_OTHER): Admitting: Obstetrics and Gynecology

## 2018-02-17 VITALS — BP 107/76 | HR 75 | Wt 152.1 lb

## 2018-02-17 DIAGNOSIS — Z3493 Encounter for supervision of normal pregnancy, unspecified, third trimester: Secondary | ICD-10-CM | POA: Diagnosis not present

## 2018-02-17 LAB — POCT URINALYSIS DIPSTICK
Bilirubin, UA: NEGATIVE
Blood, UA: NEGATIVE
GLUCOSE UA: NEGATIVE
KETONES UA: NEGATIVE
Leukocytes, UA: NEGATIVE
NITRITE UA: NEGATIVE
PROTEIN UA: NEGATIVE
SPEC GRAV UA: 1.02 (ref 1.010–1.025)
Urobilinogen, UA: 0.2 E.U./dL
pH, UA: 5 (ref 5.0–8.0)

## 2018-02-17 NOTE — Progress Notes (Signed)
ROB-doing well. Postdates care discussed.

## 2018-02-17 NOTE — Progress Notes (Signed)
Pt is here for an ROB visit. Would like to be checked. 

## 2018-02-23 ENCOUNTER — Ambulatory Visit (INDEPENDENT_AMBULATORY_CARE_PROVIDER_SITE_OTHER)

## 2018-02-23 ENCOUNTER — Ambulatory Visit

## 2018-02-23 ENCOUNTER — Ambulatory Visit (INDEPENDENT_AMBULATORY_CARE_PROVIDER_SITE_OTHER): Admitting: Obstetrics and Gynecology

## 2018-02-23 VITALS — BP 125/80 | HR 79 | Wt 154.0 lb

## 2018-02-23 DIAGNOSIS — Z3493 Encounter for supervision of normal pregnancy, unspecified, third trimester: Secondary | ICD-10-CM | POA: Diagnosis not present

## 2018-02-23 LAB — POCT URINALYSIS DIPSTICK
Bilirubin, UA: NEGATIVE
Blood, UA: NEGATIVE
Glucose, UA: NEGATIVE
Ketones, UA: NEGATIVE
LEUKOCYTES UA: NEGATIVE
NITRITE UA: NEGATIVE
PH UA: 6.5 (ref 5.0–8.0)
Protein, UA: POSITIVE — AB
Spec Grav, UA: 1.015 (ref 1.010–1.025)
UROBILINOGEN UA: 0.2 U/dL

## 2018-02-23 NOTE — Progress Notes (Signed)
ROB & postdates testing; reviewed u/s and NST, FOR IOL tomorrow at 5am. NST reactive Growth scan reveals: Indications: Growth; Post-dates Findings:  Singleton intrauterine pregnancy is visualized with FHR at 147 BPM. Biometrics give an (U/S) Gestational age of 25 2/7 weeks and an (U/S) EDD of 02/28/18; this correlates with the clinically established EDD of 02/21/18.  Fetal presentation is vertex.  EFW: 3779 grams (8lb 5oz).  68th percentile. Placenta: Posterior and grade 3. AFI: 10.3 cm.  Fetal stomach, kidneys, and bladder appear WNL.  Impression: 1. 39 2/7 week Viable Singleton Intrauterine pregnancy by U/S. 2. (U/S) EDD is consistent with Clinically established (LMP) EDD of 02/21/18. 3. EFW: 3779 grams (8lb 5oz).  68th percentile.

## 2018-02-23 NOTE — Progress Notes (Signed)
ROB & NST- pt is having pelvic pressure 

## 2018-02-24 ENCOUNTER — Inpatient Hospital Stay
Admission: EM | Admit: 2018-02-24 | Discharge: 2018-02-26 | DRG: 807 | Disposition: A | Discharge status: Home / Self Care | Attending: Certified Nurse Midwife | Admitting: Certified Nurse Midwife

## 2018-02-24 ENCOUNTER — Other Ambulatory Visit: Payer: Self-pay

## 2018-02-24 ENCOUNTER — Inpatient Hospital Stay: Admitting: Anesthesiology

## 2018-02-24 DIAGNOSIS — D649 Anemia, unspecified: Secondary | ICD-10-CM | POA: Diagnosis present

## 2018-02-24 DIAGNOSIS — Z3A40 40 weeks gestation of pregnancy: Secondary | ICD-10-CM | POA: Diagnosis not present

## 2018-02-24 DIAGNOSIS — O9902 Anemia complicating childbirth: Secondary | ICD-10-CM | POA: Diagnosis present

## 2018-02-24 DIAGNOSIS — O480 Post-term pregnancy: Principal | ICD-10-CM | POA: Diagnosis present

## 2018-02-24 HISTORY — DX: Meningitis, unspecified: G03.9

## 2018-02-24 LAB — CBC
HCT: 32.3 % — ABNORMAL LOW (ref 35.0–47.0)
HEMOGLOBIN: 10.8 g/dL — AB (ref 12.0–16.0)
MCH: 27.5 pg (ref 26.0–34.0)
MCHC: 33.4 g/dL (ref 32.0–36.0)
MCV: 82.4 fL (ref 80.0–100.0)
Platelets: 329 10*3/uL (ref 150–440)
RBC: 3.92 MIL/uL (ref 3.80–5.20)
RDW: 13.5 % (ref 11.5–14.5)
WBC: 10.8 10*3/uL (ref 3.6–11.0)

## 2018-02-24 LAB — TYPE AND SCREEN
ABO/RH(D): A POS
ANTIBODY SCREEN: NEGATIVE

## 2018-02-24 MED ORDER — LIDOCAINE HCL (PF) 1 % IJ SOLN
INTRAMUSCULAR | Status: DC | PRN
  Administered 2018-02-24: 3 mL

## 2018-02-24 MED ORDER — LACTATED RINGERS IV SOLN: 500.0000 mL | INTRAVENOUS | Status: DC | PRN

## 2018-02-24 MED ORDER — LACTATED RINGERS IV SOLN: 500.0000 mL | Freq: Once | INTRAVENOUS | Status: DC

## 2018-02-24 MED ORDER — LIDOCAINE-EPINEPHRINE (PF) 1.5 %-1:200000 IJ SOLN
INTRAMUSCULAR | Status: DC | PRN
  Administered 2018-02-24: 3 mL via PERINEURAL

## 2018-02-24 MED ORDER — MISOPROSTOL 25 MCG QUARTER TABLET
25.0000 ug | ORAL_TABLET | ORAL | Status: DC | PRN
  Administered 2018-02-24 (×2): 25 ug via VAGINAL
  Filled 2018-02-24 (×3): qty 1

## 2018-02-24 MED ORDER — ONDANSETRON HCL 4 MG/2ML IJ SOLN: 4.0000 mg | Freq: Four times a day (QID) | INTRAMUSCULAR | Status: DC | PRN

## 2018-02-24 MED ORDER — MISOPROSTOL 25 MCG QUARTER TABLET
50.0000 ug | ORAL_TABLET | ORAL | Status: DC
  Administered 2018-02-24: 50 ug via VAGINAL

## 2018-02-24 MED ORDER — OXYCODONE-ACETAMINOPHEN 5-325 MG PO TABS: 2.0000 | ORAL_TABLET | ORAL | Status: DC | PRN

## 2018-02-24 MED ORDER — LACTATED RINGERS IV SOLN
INTRAVENOUS | Status: DC
  Administered 2018-02-24 (×3): via INTRAVENOUS

## 2018-02-24 MED ORDER — FENTANYL 2.5 MCG/ML W/ROPIVACAINE 0.15% IN NS 100 ML EPIDURAL (ARMC)
12.0000 mL/h | EPIDURAL | Status: DC
  Administered 2018-02-25: 12 mL/h via EPIDURAL

## 2018-02-24 MED ORDER — FENTANYL 2.5 MCG/ML W/ROPIVACAINE 0.15% IN NS 100 ML EPIDURAL (ARMC)
EPIDURAL | Status: AC
  Filled 2018-02-24: qty 100

## 2018-02-24 MED ORDER — FENTANYL CITRATE (PF) 100 MCG/2ML IJ SOLN
50.0000 ug | INTRAMUSCULAR | Status: DC | PRN
Start: 2018-02-24 — End: 2018-02-25
  Administered 2018-02-24 (×2): 100 ug via INTRAVENOUS
  Filled 2018-02-24 (×3): qty 2

## 2018-02-24 MED ORDER — SODIUM CHLORIDE 0.9% FLUSH
3.0000 mL | INTRAVENOUS | Status: DC | PRN
  Administered 2018-02-24 (×2): 3 mL via INTRAVENOUS
  Filled 2018-02-24 (×2): qty 3

## 2018-02-24 MED ORDER — SODIUM CHLORIDE 0.9 % IV SOLN: 250.0000 mL | INTRAVENOUS | Status: DC | PRN

## 2018-02-24 MED ORDER — TERBUTALINE SULFATE 1 MG/ML IJ SOLN: 0.2500 mg | Freq: Once | INTRAMUSCULAR | Status: DC | PRN

## 2018-02-24 MED ORDER — ACETAMINOPHEN 325 MG PO TABS: 650.0000 mg | ORAL_TABLET | ORAL | Status: DC | PRN

## 2018-02-24 MED ORDER — LIDOCAINE HCL (PF) 1 % IJ SOLN: 30.0000 mL | INTRAMUSCULAR | Status: DC | PRN

## 2018-02-24 MED ORDER — SODIUM CHLORIDE FLUSH 0.9 % IV SOLN
INTRAVENOUS | Status: AC
  Filled 2018-02-24: qty 10

## 2018-02-24 MED ORDER — DIPHENHYDRAMINE HCL 50 MG/ML IJ SOLN
12.5000 mg | INTRAMUSCULAR | Status: DC | PRN
  Administered 2018-02-25 (×2): 12.5 mg via INTRAVENOUS
  Filled 2018-02-24: qty 1

## 2018-02-24 MED ORDER — BUPIVACAINE HCL (PF) 0.25 % IJ SOLN
INTRAMUSCULAR | Status: DC | PRN
  Administered 2018-02-24: 10 mL via EPIDURAL

## 2018-02-24 MED ORDER — PHENYLEPHRINE 40 MCG/ML (10ML) SYRINGE FOR IV PUSH (FOR BLOOD PRESSURE SUPPORT)
80.0000 ug | PREFILLED_SYRINGE | INTRAVENOUS | Status: DC | PRN
  Filled 2018-02-24: qty 5

## 2018-02-24 MED ORDER — SODIUM CHLORIDE 0.9% FLUSH: 3.0000 mL | Freq: Two times a day (BID) | INTRAVENOUS | Status: DC

## 2018-02-24 MED ORDER — OXYCODONE-ACETAMINOPHEN 5-325 MG PO TABS: 1.0000 | ORAL_TABLET | ORAL | Status: DC | PRN

## 2018-02-24 MED ORDER — EPHEDRINE 5 MG/ML INJ
10.0000 mg | INTRAVENOUS | Status: DC | PRN
  Filled 2018-02-24: qty 2

## 2018-02-24 MED ORDER — OXYTOCIN 40 UNITS IN LACTATED RINGERS INFUSION - SIMPLE MED
1.0000 m[IU]/min | INTRAVENOUS | Status: DC
  Filled 2018-02-24 (×2): qty 1000

## 2018-02-24 MED ORDER — OXYTOCIN BOLUS FROM INFUSION
500.0000 mL | Freq: Once | INTRAVENOUS | Status: AC
  Administered 2018-02-25: 500 mL via INTRAVENOUS

## 2018-02-24 MED ORDER — SOD CITRATE-CITRIC ACID 500-334 MG/5ML PO SOLN: 30.0000 mL | ORAL | Status: DC | PRN

## 2018-02-24 NOTE — Anesthesia Procedure Notes (Signed)
Epidural Patient location during procedure: OB Start time: 02/24/2018 11:09 PM End time: 02/24/2018 11:14 PM  Staffing Anesthesiologist: Yves Dillarroll, Yancey Pedley, MD Performed: anesthesiologist   Preanesthetic Checklist Completed: patient identified, site marked, surgical consent, pre-op evaluation, timeout performed, IV checked, risks and benefits discussed and monitors and equipment checked  Epidural Patient position: sitting Prep: Betadine Patient monitoring: heart rate, continuous pulse ox and blood pressure Approach: midline Location: L3-L4 Injection technique: LOR air  Needle:  Needle type: Tuohy  Needle gauge: 17 G Needle length: 9 cm and 9 Catheter type: closed end flexible Catheter size: 19 Gauge Test dose: negative and 1.5% lidocaine with Epi 1:200 K  Assessment Events: blood not aspirated, injection not painful, no injection resistance, negative IV test and no paresthesia  Additional Notes Time out called.  Patient placed in sitting position.  Back prepped and draped in sterile fashion.  A skin wheal was made in the L3-L4 interspace with 1% Lidocaine plain.  A 17G Tuohy needle was advanced into the epidural space by a loss of resistance technique.  The epidural catheter was threaded 3 cm and the TD was negative.  The catheter was affixed to the back in sterile fashion.  The patient tolerated the procedure well.  No blood , fluid or paresthesias.Reason for block:procedure for pain

## 2018-02-24 NOTE — Progress Notes (Signed)
Spoke with Harlow MaresMelody Shambley, CNM made aware pt arrived to LD for scheduled IOL. Pt states confirms she has only seen Melody this pregnancy. FHT Category 1, SVE 1.5/50/posterior, Cytotec 25mcg placed vaginally. Pt having difficulty tolerating exam and placement of med. Provided reassurance after exam, pt advised to remain in bed for next 30 mins to allow med to dissolve before going to restroom. Pt verbalized understanding.
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
## 2018-02-24 NOTE — Progress Notes (Signed)
LABOR NOTE   Christina Ruiz 26 y.o.GP@ at 4561w3d Early latent labor.  SUBJECTIVE:  Feeling moderate cramps. Rates pain 6/10.  OBJECTIVE:  BP 129/85   Pulse 79   Temp 98.3 F (36.8 C) (Oral)   Resp 18   Ht 5' 3.5" (1.613 m)   Wt 154 lb (69.9 kg)   LMP 05/17/2017   BMI 26.85 kg/m  Total I/O In: 279.2 [I.V.:279.2] Out: -   She has not shown cervical change. CERVIX: 2 cm:  70%:   -2:   posterior:   firm SVE:   Dilation: 2 Effacement (%): 70 Station: -2 Exam by:: A. Janee Mornhompson, CNM CONTRACTIONS: regular, every 1-2 minutes FHR: Fetal heart tracing reviewed. Baseline: 135 bpm, Variability: Good {> 6 bpm), Accelerations: Reactive and Decelerations: Absent Category I   Analgesia: Labor support without medications  Labs: Lab Results  Component Value Date   WBC 10.8 02/24/2018   HGB 10.8 (L) 02/24/2018   HCT 32.3 (L) 02/24/2018   MCV 82.4 02/24/2018   PLT 329 02/24/2018    ASSESSMENT: 1) Labor curve reviewed.       Progress: Early latent labor.     Membranes: intact       Active Problems:   Labor and delivery, indication for care   PLAN: continue present management, discussed use of foley bulb at next exam. Pt states that she will consider doing it.   Doreene Burkennie Josph Norfleet, CNM  02/24/2018 5:42 PM
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
## 2018-02-24 NOTE — Progress Notes (Signed)
LABOR NOTE   Christina Ruiz 27 y.o.GP@ at 4251w3d Active phase labor.  SUBJECTIVE:  Comfortable with epidural OBJECTIVE:  BP 118/65   Pulse 80   Temp 98.5 F (36.9 C) (Oral)   Resp 16   Ht 5' 3.5" (1.613 m)   Wt 154 lb (69.9 kg)   LMP 05/17/2017   SpO2 100%   BMI 26.85 kg/m  No intake/output data recorded.  She has shown cervical change. CERVIX: 6cm:  90%:   -1:   mid position:   soft SVE:   Dilation: 6 Effacement (%): 90 Station: -1 Exam by:: A. Daveigh Batty CNM CONTRACTIONS: regular, every 1-2 minutes FHR: Fetal heart tracing reviewed. Baseline: 130 bpm, Variability: Good {> 6 bpm), Accelerations: Reactive and Decelerations: Early Category I   Analgesia: Epidural  Labs: Lab Results  Component Value Date   WBC 10.8 02/24/2018   HGB 10.8 (L) 02/24/2018   HCT 32.3 (L) 02/24/2018   MCV 82.4 02/24/2018   PLT 329 02/24/2018    ASSESSMENT: 1) Labor curve reviewed.       Progress: Active phase labor.     Membranes: ruptured, clear fluid       Active Problems:   Labor and delivery, indication for care   PLAN: continue present management  Doreene Burkennie Temika Sutphin, CNM  02/24/2018 11:47 PM

## 2018-02-24 NOTE — Progress Notes (Signed)
LABOR NOTE   Christina Ruiz 27 y.o.GP@ at 2039w3d Not in labor.  SUBJECTIVE:  Feels mild cramping OBJECTIVE:  BP 129/85   Pulse 79   Temp 98.3 F (36.8 C) (Oral)   Resp 18   Ht 5' 3.5" (1.613 m)   Wt 154 lb (69.9 kg)   LMP 05/17/2017   BMI 26.85 kg/m  Total I/O In: 279.2 [I.V.:279.2] Out: -   She has shown cervical change. CERVIX: 2 cm:  70%:   -2:   posterior:   firm SVE:   Dilation: 1.5 Effacement (%): 50 Station: -3 Exam by:: Arta SilenceK. Yates, RN CONTRACTIONS: regular, every 3 minutes FHR: Fetal heart tracing reviewed. Baseline: 135 bpm, Variability: Good {> 6 bpm), Accelerations: Reactive and Decelerations: Absent Category I   Analgesia: Labor support without medications  Labs: Lab Results  Component Value Date   WBC 10.8 02/24/2018   HGB 10.8 (L) 02/24/2018   HCT 32.3 (L) 02/24/2018   MCV 82.4 02/24/2018   PLT 329 02/24/2018    ASSESSMENT: 1) Labor curve reviewed.       Progress: Not in labor.     Membranes: intact      Active Problems:   Labor and delivery, indication for care Postdate  PLAN: Pt declines foley bulb placement at this time. Will increase cyctotec dose to 50 mcg.   Doreene Burkennie Jamielynn Wigley, CNM  02/24/2018 3:27 PM

## 2018-02-24 NOTE — Progress Notes (Signed)
0510 - Pt arrived to unit from home via ER for scheduled IOL, postdates, s/o at side, both oriented to room and  induction process. Pt confirms +FM, denies any painful contractions, spotting, bleeding, leaking fluid or recent n/v. Says she was placed on bedrest about 4 weeks ago for syncope episodes and had hyperemesis this pregnancy. GBS neg. Planning to breastfeed. Wants Epidural.

## 2018-02-24 NOTE — Progress Notes (Signed)
Marcelino DusterMichelle CNM made aware of pt arrival for IOL, will [facility] and notify also.

## 2018-02-24 NOTE — Progress Notes (Signed)
LABOR NOTE   Christina Ruiz 27 y.o.GP@ at 2051w3d Early latent labor.  SUBJECTIVE:  Patient complains of moderate cramps and back pain OBJECTIVE:  BP 128/80   Pulse 77   Temp 98.6 F (37 C) (Oral)   Resp 18   Ht 5' 3.5" (1.613 m)   Wt 154 lb (69.9 kg)   LMP 05/17/2017   BMI 26.85 kg/m  No intake/output data recorded.  She has shown cervical change. CERVIX: 3 cm:  70%:   -2:   mid position:   firm SVE:   Dilation: 3 Effacement (%): 70 Station: -2 Exam by:: A. Benjerman Molinelli CNM CONTRACTIONS: regular, every 1 minutes FHR: Fetal heart tracing reviewed. Baseline: 140 bpm, Variability: Good {> 6 bpm), Accelerations: Reactive and Decelerations: Absent Category I   Analgesia: Labor support without medications  Labs: Lab Results  Component Value Date   WBC 10.8 02/24/2018   HGB 10.8 (L) 02/24/2018   HCT 32.3 (L) 02/24/2018   MCV 82.4 02/24/2018   PLT 329 02/24/2018    ASSESSMENT: 1) Labor curve reviewed.       Progress: Early latent labor.     Membranes: ruptured, clear fluid       Active Problems:   Labor and delivery, indication for care   PLAN: continue present management  Doreene Burkennie Marthann Abshier, CNM  02/24/2018 8:05 PM

## 2018-02-24 NOTE — Progress Notes (Signed)
LABOR NOTE   Christina Ruiz 27 y.o.GP@ at 5637w3d Not in labor.  SUBJECTIVE:  Feels mild cramping OBJECTIVE:  BP 129/85   Pulse 79   Temp 98.3 F (36.8 C) (Oral)   Resp 18   Ht 5' 3.5" (1.613 m)   Wt 154 lb (69.9 kg)   LMP 05/17/2017   BMI 26.85 kg/m  Total I/O In: 279.2 [I.V.:279.2] Out: -   She has not shown cervical change. Per RN exam  CERVIX: not evaluated: SVE:   Dilation: 1.5 Effacement (%): 50 Station: -3 Exam by:: Arta SilenceK. Yates, RN CONTRACTIONS: irregular, every 2-5 minutes FHR: Fetal heart tracing reviewed. Baseline: 130 bpm, Variability: Good {> 6 bpm), Accelerations: Reactive and Decelerations: Absent Category I   Analgesia: Labor support without medications  Labs: Lab Results  Component Value Date   WBC 10.8 02/24/2018   HGB 10.8 (L) 02/24/2018   HCT 32.3 (L) 02/24/2018   MCV 82.4 02/24/2018   PLT 329 02/24/2018    ASSESSMENT: 1) Labor curve reviewed.       Progress: Not in labor.     Membranes: intact       Active Problems:   Labor and delivery, indication for care Postdates   PLAN: Continue with cytotec. Discussed use of foley bulb with patient and her family. Plan to re-evaluate cervix and place foley bulb with next Cytotec dose.  Christina Ruiz, CNM  02/24/2018

## 2018-02-25 DIAGNOSIS — O480 Post-term pregnancy: Secondary | ICD-10-CM

## 2018-02-25 DIAGNOSIS — Z3A40 40 weeks gestation of pregnancy: Secondary | ICD-10-CM

## 2018-02-25 LAB — CBC
HEMATOCRIT: 30.4 % — AB (ref 35.0–47.0)
Hemoglobin: 10 g/dL — ABNORMAL LOW (ref 12.0–16.0)
MCH: 27 pg (ref 26.0–34.0)
MCHC: 33 g/dL (ref 32.0–36.0)
MCV: 82 fL (ref 80.0–100.0)
PLATELETS: 255 10*3/uL (ref 150–440)
RBC: 3.71 MIL/uL — ABNORMAL LOW (ref 3.80–5.20)
RDW: 13.5 % (ref 11.5–14.5)
WBC: 12 10*3/uL — AB (ref 3.6–11.0)

## 2018-02-25 LAB — RPR: RPR Ser Ql: NONREACTIVE

## 2018-02-25 MED ORDER — IBUPROFEN 600 MG PO TABS
600.0000 mg | ORAL_TABLET | Freq: Four times a day (QID) | ORAL | Status: DC
  Administered 2018-02-25 – 2018-02-26 (×6): 600 mg via ORAL
  Filled 2018-02-25 (×6): qty 1

## 2018-02-25 MED ORDER — SIMETHICONE 80 MG PO CHEW: 80.0000 mg | CHEWABLE_TABLET | ORAL | Status: DC | PRN

## 2018-02-25 MED ORDER — DIBUCAINE 1 % RE OINT: 1.0000 "application " | TOPICAL_OINTMENT | RECTAL | Status: DC | PRN

## 2018-02-25 MED ORDER — PRENATAL MULTIVITAMIN CH
1.0000 | ORAL_TABLET | Freq: Every day | ORAL | Status: DC
  Administered 2018-02-25 – 2018-02-26 (×2): 1 via ORAL
  Filled 2018-02-25 (×2): qty 1

## 2018-02-25 MED ORDER — LIDOCAINE HCL (PF) 2 % IJ SOLN
INTRAMUSCULAR | Status: DC | PRN
  Administered 2018-02-25: 7 mL via INTRADERMAL

## 2018-02-25 MED ORDER — WITCH HAZEL-GLYCERIN EX PADS: 1.0000 "application " | MEDICATED_PAD | CUTANEOUS | Status: DC | PRN

## 2018-02-25 MED ORDER — SENNOSIDES-DOCUSATE SODIUM 8.6-50 MG PO TABS
2.0000 | ORAL_TABLET | ORAL | Status: DC
  Administered 2018-02-25: 2 via ORAL
  Filled 2018-02-25: qty 2

## 2018-02-25 MED ORDER — FERROUS SULFATE 325 (65 FE) MG PO TABS
325.0000 mg | ORAL_TABLET | Freq: Every day | ORAL | Status: DC
  Administered 2018-02-25 – 2018-02-26 (×2): 325 mg via ORAL
  Filled 2018-02-25 (×2): qty 1

## 2018-02-25 MED ORDER — TETANUS-DIPHTH-ACELL PERTUSSIS 5-2.5-18.5 LF-MCG/0.5 IM SUSP: 0.5000 mL | Freq: Once | INTRAMUSCULAR | Status: DC

## 2018-02-25 MED ORDER — METHYLERGONOVINE MALEATE 0.2 MG/ML IJ SOLN: 0.2000 mg | INTRAMUSCULAR | Status: DC | PRN

## 2018-02-25 MED ORDER — ONDANSETRON HCL 4 MG PO TABS: 4.0000 mg | ORAL_TABLET | ORAL | Status: DC | PRN

## 2018-02-25 MED ORDER — OXYCODONE HCL 5 MG PO TABS: 5.0000 mg | ORAL_TABLET | ORAL | Status: DC | PRN

## 2018-02-25 MED ORDER — COCONUT OIL OIL: 1.0000 "application " | TOPICAL_OIL | Status: DC | PRN

## 2018-02-25 MED ORDER — DOCUSATE SODIUM 100 MG PO CAPS
100.0000 mg | ORAL_CAPSULE | Freq: Two times a day (BID) | ORAL | Status: DC
  Administered 2018-02-25 – 2018-02-26 (×2): 100 mg via ORAL
  Filled 2018-02-25 (×2): qty 1

## 2018-02-25 MED ORDER — OXYTOCIN 40 UNITS IN LACTATED RINGERS INFUSION - SIMPLE MED
2.5000 [IU]/h | INTRAVENOUS | Status: DC
  Administered 2018-02-25: 2.5 [IU]/h via INTRAVENOUS

## 2018-02-25 MED ORDER — ONDANSETRON HCL 4 MG/2ML IJ SOLN: 4.0000 mg | INTRAMUSCULAR | Status: DC | PRN

## 2018-02-25 MED ORDER — OXYCODONE HCL 5 MG PO TABS: 10.0000 mg | ORAL_TABLET | ORAL | Status: DC | PRN

## 2018-02-25 MED ORDER — METHYLERGONOVINE MALEATE 0.2 MG PO TABS: 0.2000 mg | ORAL_TABLET | ORAL | Status: DC | PRN

## 2018-02-25 MED ORDER — ACETAMINOPHEN 325 MG PO TABS
650.0000 mg | ORAL_TABLET | ORAL | Status: DC | PRN
Start: 2018-02-25 — End: 2018-02-26

## 2018-02-25 MED ORDER — BENZOCAINE-MENTHOL 20-0.5 % EX AERO
1.0000 "application " | INHALATION_SPRAY | CUTANEOUS | Status: DC | PRN
  Administered 2018-02-25: 1 via TOPICAL
  Filled 2018-02-25: qty 56

## 2018-02-25 NOTE — Progress Notes (Signed)
Progress Note - Vaginal Delivery  Christina RoersRachel Ruiz is a 27 y.o. G1P1001 now PP day 0 s/p Vaginal, Spontaneous .   Subjective:  The patient reports no complaints, up ad lib, voiding, tolerating PO and + flatus  Objective:  Vital signs in last 24 hours: Temp:  [97.5 F (36.4 C)-98.6 F (37 C)] 98.2 F (36.8 C) (06/06 0912) Pulse Rate:  [61-93] 64 (06/06 0912) Resp:  [16-18] 18 (06/06 0912) BP: (94-147)/(58-85) 121/71 (06/06 0912) SpO2:  [98 %-100 %] 98 % (06/06 0912)  Physical Exam:  General: alert, cooperative, appears stated age and fatigued Lochia: appropriate Uterine Fundus: firm DVT Evaluation: No evidence of DVT seen on physical exam. No cords or calf tenderness. No significant calf/ankle edema.    Data Review Recent Labs    02/24/18 0535 02/25/18 0656  HGB 10.8* 10.0*  HCT 32.3* 30.4*    Assessment/Plan: Active Problems:   Labor and delivery, indication for care   Plan for discharge tomorrow  -- Continue routine PP care.     [name], CNM  02/25/2018 9:47 AM

## 2018-02-25 NOTE — Lactation Note (Signed)
This note was copied from a baby's chart. Lactation Consultation Note  Patient Name: Christina Ruiz Reason for consult: Mother's request   Maternal Data Has patient been taught Hand Expression?: Yes Does the patient have breastfeeding experience prior to this delivery?: No  Feeding Feeding Type: Breast Fed Length of feed: 15 min  LATCH Score Latch: Grasps breast easily, tongue down, lips flanged, rhythmical sucking.  Audible Swallowing: Spontaneous and intermittent  Type of Nipple: Everted at rest and after stimulation  Comfort (Breast/Nipple): Soft / non-tender  Hold (Positioning): Assistance needed to correctly position infant at breast and maintain latch.  LATCH Score: 9  Interventions Interventions: Breast feeding basics reviewed;Assisted with latch;Hand express  Lactation Tools Discussed/Used     Consult Status Consult Status: PRN Baby is nursing well. Mom has lots of colostrum and knows how to properly latch baby. Mother was told to [facility] for assistance if issues came up.    Burnadette PeterJaniya M Mystic Labo Ruiz, 4:50 PM

## 2018-02-26 MED ORDER — IBUPROFEN 600 MG PO TABS: 600.0000 mg | ORAL_TABLET | Freq: Four times a day (QID) | ORAL | 0 refills | Status: DC

## 2018-02-26 MED ORDER — DOCUSATE SODIUM 100 MG PO CAPS: 100.0000 mg | ORAL_CAPSULE | Freq: Two times a day (BID) | ORAL | 0 refills | Status: DC

## 2018-02-26 NOTE — Discharge Summary (Signed)
                             Discharge Summary  Date of Admission: 02/24/2018  Date of Discharge: 02/26/2018  Admitting Diagnosis: Induction of labor at 4971w4d  Mode of Delivery: normal spontaneous vaginal delivery                 Discharge Diagnosis: No other diagnosis   Intrapartum Procedures: Atificial rupture of membranes and pitocin augmentation   Post partum procedures: none  Complications: 2 degree perineal laceration                      Discharge Day SOAP Note:  Progress Note - Vaginal Delivery  Christina Ruiz is a 27 y.o. G1P1001 now PP day 1 s/p Vaginal, Spontaneous . Delivery was uncomplicated  Subjective  The patient has the following complaints: has no unusual complaints  Pain is controlled with current medications.   Patient is urinating without difficulty.  She is ambulating well.    Objective  Vital signs: BP 114/80 (BP Location: Right Arm)   Pulse 77   Temp 97.9 F (36.6 C) (Oral)   Resp 18   Ht 5' 3.5" (1.613 m)   Wt 154 lb (69.9 kg)   LMP 05/17/2017   SpO2 99%   Breastfeeding? Unknown   BMI 26.85 kg/m   Physical Exam: Gen: NAD Fundus Fundal Tone: Firm  Lochia Amount: Small  Perineum Appearance: Intact, Edematous     Data Review Labs: CBC Latest Ref Rng & Units 02/25/2018 02/24/2018 02/03/2018  WBC 3.6 - 11.0 K/uL 12.0(H) 10.8 8.1  Hemoglobin 12.0 - 16.0 g/dL 10.0(L) 10.8(L) 10.0(L)  Hematocrit 35.0 - 47.0 % 30.4(L) 32.3(L) 31.8(L)  Platelets 150 - 440 K/uL 255 329 296   A POS  Assessment/Plan  Active Problems:   Labor and delivery, indication for care    Plan for discharge today.   Discharge Instructions: Per After Visit Summary. Activity: Advance as tolerated. Pelvic rest for 6 weeks.  Also refer to After Visit Summary Diet: Regular Medications: Allergies as of 02/26/2018   No Known Allergies     Medication List    STOP taking these medications   multivitamin tablet     TAKE these medications   acetaminophen 325 MG  tablet Commonly known as:  TYLENOL Take 2 tablets (650 mg total) by mouth every 6 (six) hours as needed for mild pain (or Fever >/= 101).   docusate sodium 100 MG capsule Commonly known as:  COLACE Take 1 capsule (100 mg total) by mouth 2 (two) times daily.   ferrous sulfate 325 (65 FE) MG tablet Take 325 mg by mouth daily with breakfast.   FUSION PLUS PO Take by mouth.   ibuprofen 600 MG tablet Commonly known as:  ADVIL,MOTRIN Take 1 tablet (600 mg total) by mouth every 6 (six) hours.   prenatal multivitamin Tabs tablet Take 1 tablet by mouth daily at 12 noon.      Outpatient follow up:  Postpartum contraception: Will discuss at first office visit post-partum  Discharged Condition: good  Discharged to: home  Newborn Data: Disposition:home with mother  Apgars: APGAR (1 MIN): 8   APGAR (5 MINS): 9   APGAR (10 MINS):    Baby Feeding: Breast    Elonda Huskyavid J. Evans, M.D. 02/26/2018 5:11 PM

## 2018-02-26 NOTE — Anesthesia Postprocedure Evaluation (Signed)
Anesthesia Post Note  Patient: Christina Ruiz  Procedure(s) Performed: AN AD HOC LABOR EPIDURAL  Patient location during evaluation: Mother Baby Anesthesia Type: Epidural Level of consciousness: awake and alert and oriented Pain management: satisfactory to patient Vital Signs Assessment: post-procedure vital signs reviewed and stable Respiratory status: respiratory function stable Cardiovascular status: stable Postop Assessment: no headache, no backache, epidural receding, patient able to bend at knees, no apparent nausea or vomiting, able to ambulate and adequate PO intake Anesthetic complications: no     Last Vitals:  Vitals:   02/25/18 1509 02/26/18 0049  BP: 116/68 (!) 122/92  Pulse:  (!) 58  Resp: 18 20  Temp: 36.8 C 36.8 C  SpO2: 99% 98%    Last Pain:  Vitals:   02/26/18 0559  TempSrc:   PainSc: Asleep                 Clydene PughBeane, Christina Ruiz

## 2018-02-26 NOTE — Progress Notes (Signed)
Patient discharged home with infant. Discharge instructions, prescriptions and follow up appointment given to and reviewed with patient. Patient verbalized understanding. Patient wheeled out by nursing students

## 2018-02-27 LAB — VARICELLA ZOSTER ANTIBODY, IGG: Varicella IgG: 987 index (ref >165)

## 2018-02-27 LAB — HEPATITIS B SURFACE ANTIGEN: Hepatitis B Surface Ag: NEGATIVE

## 2018-02-28 ENCOUNTER — Other Ambulatory Visit: Payer: Self-pay | Admitting: Certified Nurse Midwife

## 2018-02-28 MED ORDER — FUSION PLUS PO CAPS: 1.0000 | ORAL_CAPSULE | Freq: Every day | ORAL | 5 refills | Status: DC

## 2018-02-28 MED ORDER — PRENATAL MULTIVITAMIN + DHA 28-0.8 & 200 MG PO MISC: 1.0000 | Freq: Every day | ORAL | 11 refills | Status: DC

## 2018-03-01 ENCOUNTER — Telehealth: Payer: Self-pay

## 2018-03-01 NOTE — Telephone Encounter (Signed)
Unable to leave a message due to mail box not being set up.

## 2018-03-02 ENCOUNTER — Telehealth: Payer: Self-pay

## 2018-03-02 NOTE — Telephone Encounter (Signed)
Voice mail box not set up.

## 2018-03-04 ENCOUNTER — Telehealth: Payer: Self-pay

## 2018-03-04 NOTE — Telephone Encounter (Signed)
Attempted to contact pt at home phone number listed but recording states the person you are trying to call is not accepting calls at this time.

## 2018-03-06 ENCOUNTER — Other Ambulatory Visit: Payer: Self-pay | Admitting: Certified Nurse Midwife

## 2018-03-06 MED ORDER — DICLOXACILLIN SODIUM 500 MG PO CAPS: 500.0000 mg | ORAL_CAPSULE | Freq: Four times a day (QID) | ORAL | 0 refills | Status: AC

## 2018-03-06 NOTE — Progress Notes (Signed)
Orders placed for sign & symptoms of mastitis. PT is out of town and [facility] [place] Order placed.   Doreene BurkeAnnie Ashna Dorough, CNM

## 2018-03-22 ENCOUNTER — Ambulatory Visit (INDEPENDENT_AMBULATORY_CARE_PROVIDER_SITE_OTHER): Admitting: Certified Nurse Midwife

## 2018-03-22 ENCOUNTER — Other Ambulatory Visit (HOSPITAL_COMMUNITY)
Admission: RE | Admit: 2018-03-22 | Discharge: 2018-03-22 | Disposition: A | Discharge status: Home / Self Care | Source: Ambulatory Visit | Attending: Certified Nurse Midwife | Admitting: Certified Nurse Midwife

## 2018-03-22 VITALS — BP 110/65 | HR 93 | Temp 99.0°F | Ht 63.0 in | Wt 133.1 lb

## 2018-03-22 DIAGNOSIS — R300 Dysuria: Secondary | ICD-10-CM | POA: Diagnosis not present

## 2018-03-22 LAB — POCT URINALYSIS DIPSTICK
BILIRUBIN UA: NEGATIVE
GLUCOSE UA: NEGATIVE
Ketones, UA: NEGATIVE
Leukocytes, UA: NEGATIVE
Nitrite, UA: NEGATIVE
PH UA: 5 (ref 5.0–8.0)
Protein, UA: NEGATIVE
Spec Grav, UA: 1.015 (ref 1.010–1.025)
UROBILINOGEN UA: 0.2 U/dL

## 2018-03-22 MED ORDER — FLUCONAZOLE 150 MG PO TABS: 150.0000 mg | ORAL_TABLET | Freq: Once | ORAL | 1 refills | Status: AC

## 2018-03-22 NOTE — Addendum Note (Signed)
Addended by: Brooke DareSICK, Bryan Omura L on: 03/22/2018 12:30 PM   Modules accepted: Orders

## 2018-03-22 NOTE — Patient Instructions (Signed)
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
## 2018-03-22 NOTE — Addendum Note (Signed)
Addended by: Brooke DareSICK, Ichelle Harral L on: 03/22/2018 02:18 PM   Modules accepted: Orders

## 2018-03-23 LAB — CERVICOVAGINAL ANCILLARY ONLY: Bacterial vaginitis: NEGATIVE

## 2018-03-24 ENCOUNTER — Telehealth: Payer: Self-pay

## 2018-03-24 NOTE — Telephone Encounter (Signed)
Attempted to contact pt- recording states she is not available try again later. Will call home phone. Attempted to contact pt on home phone- recording states she is not accepting calls at this time.

## 2018-03-25 LAB — URINE CULTURE

## 2018-03-29 ENCOUNTER — Telehealth: Payer: Self-pay

## 2018-03-29 NOTE — Telephone Encounter (Signed)
Wireless caller not available. Unable to leave a message.

## 2018-03-29 NOTE — Telephone Encounter (Signed)
Unable to leave message on both home and cell phone- " the wireless customer is not taking calls at this time- please try again later.

## 2018-04-06 ENCOUNTER — Ambulatory Visit (INDEPENDENT_AMBULATORY_CARE_PROVIDER_SITE_OTHER): Admitting: Obstetrics and Gynecology

## 2018-04-06 ENCOUNTER — Encounter: Payer: Self-pay | Admitting: Obstetrics and Gynecology

## 2018-04-06 DIAGNOSIS — Z30430 Encounter for insertion of intrauterine contraceptive device: Secondary | ICD-10-CM

## 2018-04-06 NOTE — Progress Notes (Signed)
  Subjective:     Christina Ruiz is a 27 y.o. female who presents for a postpartum visit. She is 6 weeks postpartum following a spontaneous vaginal delivery. I have fully reviewed the prenatal and intrapartum course. The delivery was at 40.4 gestational weeks. Outcome: spontaneous vaginal delivery. Anesthesia: epidural. Postpartum course has been complicated by UTI and mastitis. Baby's course has been uncomplicated. Baby is feeding by breast. Bleeding no bleeding. Bowel function is normal. Bladder function is normal. Patient is not sexually active. Contraception method is abstinence. Postpartum depression screening: negative.  The following portions of the patient's history were reviewed and updated as appropriate: allergies, current medications, past family history, past medical history, past social history, past surgical history and problem list.  Review of Systems A comprehensive review of systems was negative.   Objective:    BP 115/84   Pulse 79   Ht 5\' 3"  (1.6 m)   Breastfeeding? Yes   BMI 23.58 kg/m   General:  alert, cooperative and appears stated age   Breasts:  inspection negative, no nipple discharge or bleeding, no masses or nodularity palpable  Lungs: clear to auscultation bilaterally  Heart:  regular rate and rhythm, S1, S2 normal, no murmur, click, rub or gallop  Abdomen: soft, non-tender; bowel sounds normal; no masses,  no organomegaly   Vulva:  normal  Vagina: normal vagina, no discharge, exudate, lesion, or erythema  Cervix:  multiparous appearance  Corpus: normal size, contour, position, consistency, mobility, non-tender  Adnexa:  no mass, fullness, tenderness  Rectal Exam: Not performed.          Procedure note: Christina Ruiz is a 27 y.o. year old 621P1001 Caucasian female who presents for placement of a Mirena IUD.  No LMP recorded. (Menstrual status: Lactating). BP 115/84   Pulse 79   Ht 5\' 3"  (1.6 m)   Breastfeeding? Yes   BMI 23.58 kg/m  Last sexual  intercourse was before delivery, and pregnancy test today was negatove  The risks and benefits of the method and placement have been thouroughly reviewed with the patient and all questions were answered.  Specifically the patient is aware of failure rate of 09/998, expulsion of the IUD and of possible perforation.  The patient is aware of irregular bleeding due to the method and understands the incidence of irregular bleeding diminishes with time.  Signed copy of informed consent in chart.   Time out was performed.  A graves speculum was placed in the vagina.  The cervix was visualized, prepped using Betadine, and grasped with a single tooth tenaculum. The uterus was found to be neutral and it sounded to 8 cm.  Mirena IUD placed per manufacturer's recommendations.   The strings were trimmed to 3 cm.      Assessment:     6 weeks postpartum exam. Pap smear not done at today's visit.   Plan:    1. Contraception: IUD The patient was given post procedure instructions, including signs and symptoms of infection and to check for the strings after each menses or each month, and refraining from intercourse or anything in the vagina for 3 days.  She was given a Mirena care card with date Mirena placed, and date Mirena to be removed. 2. Labs obtained- will follow up accordingly 3. Follow up in: 3 months or as needed.

## 2018-04-06 NOTE — Patient Instructions (Signed)
  Place postpartum visit patient instructions here.  

## 2018-04-07 LAB — CBC
Hematocrit: 39.5 % (ref 34.0–46.6)
Hemoglobin: 12.7 g/dL (ref 11.1–15.9)
MCH: 26.4 pg — ABNORMAL LOW (ref 26.6–33.0)
MCHC: 32.2 g/dL (ref 31.5–35.7)
MCV: 82 fL (ref 79–97)
PLATELETS: 281 10*3/uL (ref 150–450)
RBC: 4.81 x10E6/uL (ref 3.77–5.28)
RDW: 16.7 % — AB (ref 12.3–15.4)
WBC: 4.6 10*3/uL (ref 3.4–10.8)

## 2018-04-07 LAB — FERRITIN: FERRITIN: 20 ng/mL (ref 15–150)

## 2018-04-07 LAB — VITAMIN D 25 HYDROXY (VIT D DEFICIENCY, FRACTURES): VIT D 25 HYDROXY: 40.1 ng/mL (ref 30.0–100.0)

## 2018-04-22 ENCOUNTER — Other Ambulatory Visit: Payer: Self-pay | Admitting: Obstetrics and Gynecology

## 2018-04-22 ENCOUNTER — Telehealth: Payer: Self-pay | Admitting: *Deleted

## 2018-04-22 MED ORDER — DICLOXACILLIN SODIUM 500 MG PO CAPS: 500.0000 mg | ORAL_CAPSULE | Freq: Four times a day (QID) | ORAL | 1 refills | Status: DC

## 2018-04-22 NOTE — Telephone Encounter (Signed)
Pt is having painful breast, red, warm to touch, thinks she has mastitis, would like something sent in

## 2018-05-18 ENCOUNTER — Ambulatory Visit (INDEPENDENT_AMBULATORY_CARE_PROVIDER_SITE_OTHER): Admitting: Obstetrics and Gynecology

## 2018-05-18 ENCOUNTER — Encounter: Payer: Self-pay | Admitting: Obstetrics and Gynecology

## 2018-05-18 VITALS — BP 111/72 | HR 73 | Ht 63.0 in | Wt 137.9 lb

## 2018-05-18 DIAGNOSIS — O99345 Other mental disorders complicating the puerperium: Secondary | ICD-10-CM | POA: Diagnosis not present

## 2018-05-18 DIAGNOSIS — F53 Postpartum depression: Secondary | ICD-10-CM

## 2018-05-18 DIAGNOSIS — Z30431 Encounter for routine checking of intrauterine contraceptive device: Secondary | ICD-10-CM

## 2018-05-18 MED ORDER — ESCITALOPRAM OXALATE 10 MG PO TABS
10.0000 mg | ORAL_TABLET | Freq: Every day | ORAL | 6 refills | Status: DC
Start: 2018-05-18 — End: 2020-04-03

## 2018-05-18 NOTE — Patient Instructions (Signed)
Postpartum Depression and Baby Blues The postpartum period begins right after the birth of a baby. During this time, there is often a great amount of joy and excitement. It is also a time of many changes in the life of the parents. Regardless of how many times a mother gives birth, each child brings new challenges and dynamics to the family. It is not unusual to have feelings of excitement along with confusing shifts in moods, emotions, and thoughts. All mothers are at risk of developing postpartum depression or the "baby blues." These mood changes can occur right after giving birth, or they may occur many months after giving birth. The baby blues or postpartum depression can be mild or severe. Additionally, postpartum depression can go away rather quickly, or it can be a long-term condition. What are the causes? Raised hormone levels and the rapid drop in those levels are thought to be a main cause of postpartum depression and the baby blues. A number of hormones change during and after pregnancy. Estrogen and progesterone usually decrease right after the delivery of your baby. The levels of thyroid hormone and various cortisol steroids also rapidly drop. Other factors that play a role in these mood changes include major life events and genetics. What increases the risk? If you have any of the following risks for the baby blues or postpartum depression, know what symptoms to watch out for during the postpartum period. Risk factors that may increase the likelihood of getting the baby blues or postpartum depression include:  Having a personal or family history of depression.  Having depression while being pregnant.  Having premenstrual mood issues or mood issues related to oral contraceptives.  Having a lot of life stress.  Having marital conflict.  Lacking a social support network.  Having a baby with special needs.  Having health problems, such as diabetes.  What are the signs or  symptoms? Symptoms of baby blues include:  Brief changes in mood, such as going from extreme happiness to sadness.  Decreased concentration.  Difficulty sleeping.  Crying spells, tearfulness.  Irritability.  Anxiety.  Symptoms of postpartum depression typically begin within the first month after giving birth. These symptoms include:  Difficulty sleeping or excessive sleepiness.  Marked weight loss.  Agitation.  Feelings of worthlessness.  Lack of interest in activity or food.  Postpartum psychosis is a very serious condition and can be dangerous. Fortunately, it is rare. Displaying any of the following symptoms is cause for immediate medical attention. Symptoms of postpartum psychosis include:  Hallucinations and delusions.  Bizarre or disorganized behavior.  Confusion or disorientation.  How is this diagnosed? A diagnosis is made by an evaluation of your symptoms. There are no medical or lab tests that lead to a diagnosis, but there are various questionnaires that a health care provider may use to identify those with the baby blues, postpartum depression, or psychosis. Often, a screening tool called the Edinburgh Postnatal Depression Scale is used to diagnose depression in the postpartum period. How is this treated? The baby blues usually goes away on its own in 1-2 weeks. Social support is often all that is needed. You will be encouraged to get adequate sleep and rest. Occasionally, you may be given medicines to help you sleep. Postpartum depression requires treatment because it can last several months or longer if it is not treated. Treatment may include individual or group therapy, medicine, or both to address any social, physiological, and psychological factors that may play a role in the   depression. Regular exercise, a healthy diet, rest, and social support may also be strongly recommended. Postpartum psychosis is more serious and needs treatment right away.  Hospitalization is often needed. Follow these instructions at home:  Get as much rest as you can. Nap when the baby sleeps.  Exercise regularly. Some women find yoga and walking to be beneficial.  Eat a balanced and nourishing diet.  Do little things that you enjoy. Have a cup of tea, take a bubble bath, read your favorite magazine, or listen to your favorite music.  Avoid alcohol.  Ask for help with household chores, cooking, grocery shopping, or running errands as needed. Do not try to do everything.  Talk to people close to you about how you are feeling. Get support from your partner, family members, friends, or other new moms.  Try to stay positive in how you think. Think about the things you are grateful for.  Do not spend a lot of time alone.  Only take over-the-counter or prescription medicine as directed by your health care provider.  Keep all your postpartum appointments.  Let your health care provider know if you have any concerns. Contact a health care provider if: You are having a reaction to or problems with your medicine. Get help right away if:  You have suicidal feelings.  You think you may harm the baby or someone else. This information is not intended to replace advice given to you by your health care provider. Make sure you discuss any questions you have with your health care provider. Document Released: 06/12/2004 Document Revised: 02/14/2016 Document Reviewed: 06/20/2013 Elsevier Interactive Patient Education  2017 Elsevier Inc.  

## 2018-05-18 NOTE — Progress Notes (Signed)
  Subjective:     Patient ID: Christina Ruiz, female   DOB: [birth year], 27 y.o.   MRN: [id number]  HPI  Here for IUD check and pap.  While discussing plan for today, became tearful. Answered questioned about depression.  [facility] 05/18/2018 04/06/2018  Decreased Interest 3 0  Down, Depressed, Hopeless 3 1  PHQ - 2 Score 6 1  Altered sleeping 3 0  Tired, decreased energy 3 1  Change in appetite 3 1  Feeling bad or failure about yourself  2 1  Trouble concentrating 1 1  Moving slowly or fidgety/restless 0 -  Suicidal thoughts 1 0  PHQ-9 Score 19 5  Difficult doing work/chores Very difficult Somewhat difficult   Review of Systems  Constitutional: Positive for appetite change and fatigue.  HENT: Negative.   Eyes: Negative.   Respiratory: Negative.   Cardiovascular: Negative.   Gastrointestinal: Negative.   Endocrine: Negative.   Genitourinary: Negative.   Musculoskeletal: Negative.   Skin: Negative.   Allergic/Immunologic: Negative.   Neurological: Negative.   Hematological: Negative.   Psychiatric/Behavioral: Positive for agitation and sleep disturbance. The patient is nervous/anxious.        Objective:   Physical Exam A&Ox4 Well groomed female in no distress Tearful when answering questionaire Blood pressure 111/72, pulse 73, height 5\' 3"  (1.6 m), weight 137 lb 14.4 oz (62.6 kg), currently breastfeeding. Pelvic exam: normal external genitalia, vulva, vagina, cervix, uterus and adnexa, IUD string noted..    Assessment:     IUD check PPD Pap obtained.     Plan:     Counseled at length on PPD and treatment options. Will start Lexapro 10 mg Qhs. [place] [employer].

## 2018-05-19 ENCOUNTER — Encounter: Admitting: Obstetrics and Gynecology

## 2018-05-19 LAB — CYTOLOGY - PAP

## 2018-05-31 ENCOUNTER — Telehealth: Payer: Self-pay | Admitting: *Deleted

## 2018-05-31 NOTE — Telephone Encounter (Signed)
Notified pt. 

## 2018-05-31 NOTE — Telephone Encounter (Signed)
-----   Message from Purcell Nails, [place] sent at 05/21/2018  8:23 AM EDT ----- Please let her know her pap is negative

## 2019-09-23 NOTE — L&D Delivery Note (Signed)
       Delivery Note   Christina Ruiz is a 30 y.o. G3P1011 at [redacted]w[redacted]d Estimated Date of Delivery: 06/25/20  PRE-OPERATIVE DIAGNOSIS:  1) [redacted]w[redacted]d pregnancy.   POST-OPERATIVE DIAGNOSIS:  1) [redacted]w[redacted]d pregnancy s/p Vaginal, Spontaneous  , anemia   Delivery Type: Vaginal, Spontaneous    Delivery Anesthesia: Epidural   Labor Complications:  none    ESTIMATED BLOOD LOSS: 150  ml    FINDINGS:   1) female infant, Apgar scores of 8   at 1 minute and 9   at 5 minutes and a birthweight of   ounces.    2) Nuchal cord: Yes SPECIMENS:   PLACENTA:   Appearance: Intact  , 3 vessel cord   Removal: Spontaneous      Disposition:  per protocol   DISPOSITION:  Infant to left in stable condition in the delivery room, with L&D personnel and mother,  NARRATIVE SUMMARY: Labor course:  [name] is a G3P1011 at [redacted]w[redacted]d who presented for PROM.  She progressed well in labor with pitocin.  She received the appropriate Epidural  anesthesia and proceeded to complete dilation. She evidenced good maternal expulsive effort during the second stage. She went on to deliver a viable female infant "Aribielle". The placenta delivered without problems and was noted to be complete. A perineal and vaginal examination was performed. Episiotomy/Lacerations: None  The patient tolerated this well.  Doreene Burke, CNM  06/06/2020 12:32 PM

## 2020-03-19 ENCOUNTER — Telehealth: Payer: Self-pay | Admitting: Obstetrics & Gynecology

## 2020-03-19 NOTE — Telephone Encounter (Signed)
We have recieved records on this patient to schedule an est care or prenatal appointment. Attempted to reach patient to schedule phone number on file not accepting calls at this time .

## 2020-03-21 NOTE — Telephone Encounter (Signed)
Phone number on file not accepting calls at this time. Unable to leave message for patient to call back to be scheduled.

## 2020-03-23 ENCOUNTER — Telehealth: Payer: Self-pay | Admitting: Certified Nurse Midwife

## 2020-03-23 NOTE — Telephone Encounter (Signed)
This patient called in requesting an ob appointment as she is [redacted] weeks pregnant. She just moved back to [place] from [place] and was needing prenatal care. She is an established patient. Given she is 27 weeks how would you like me to schedule her?   Please advise.

## 2020-03-27 NOTE — Telephone Encounter (Signed)
Called and spoke with patient about scheduling with [place] for her ob care. Patient meant to have records sent to [facility]. Patient doesn't wish to have care transferred to [place] at this time

## 2020-03-27 NOTE — Telephone Encounter (Signed)
When on the phone with the patient I informed her that we would need her medical records faxed over. Patient verbalized understanding.

## 2020-03-27 NOTE — Telephone Encounter (Signed)
Sounds good to me. Thank you. 

## 2020-03-29 ENCOUNTER — Telehealth: Payer: Self-pay | Admitting: Certified Nurse Midwife

## 2020-03-29 NOTE — Telephone Encounter (Signed)
Pt called in and stated that she needs an apt. I told her we need her records sent over. The pt said that she has contacted them. I filled out a verbal release over the phone and sent it over. I told the pt I will contact her tomorrow.

## 2020-04-03 ENCOUNTER — Other Ambulatory Visit (INDEPENDENT_AMBULATORY_CARE_PROVIDER_SITE_OTHER)

## 2020-04-03 ENCOUNTER — Other Ambulatory Visit: Payer: Self-pay

## 2020-04-03 ENCOUNTER — Encounter: Payer: Self-pay | Admitting: Certified Nurse Midwife

## 2020-04-03 ENCOUNTER — Ambulatory Visit (INDEPENDENT_AMBULATORY_CARE_PROVIDER_SITE_OTHER): Admitting: Certified Nurse Midwife

## 2020-04-03 VITALS — BP 98/69 | HR 78 | Wt 133.5 lb

## 2020-04-03 DIAGNOSIS — Z3482 Encounter for supervision of other normal pregnancy, second trimester: Secondary | ICD-10-CM

## 2020-04-03 DIAGNOSIS — Z23 Encounter for immunization: Secondary | ICD-10-CM

## 2020-04-03 DIAGNOSIS — Z3A28 28 weeks gestation of pregnancy: Secondary | ICD-10-CM

## 2020-04-03 MED ORDER — TETANUS-DIPHTH-ACELL PERTUSSIS 5-2.5-18.5 LF-MCG/0.5 IM SUSP
0.5000 mL | Freq: Once | INTRAMUSCULAR | Status: AC
  Administered 2020-04-03: 0.5 mL via INTRAMUSCULAR

## 2020-04-03 NOTE — Patient Instructions (Signed)
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
## 2020-04-06 ENCOUNTER — Encounter: Payer: Self-pay | Admitting: Advanced Practice Midwife

## 2020-04-17 ENCOUNTER — Encounter: Payer: Self-pay | Admitting: Certified Nurse Midwife

## 2020-04-17 ENCOUNTER — Ambulatory Visit (INDEPENDENT_AMBULATORY_CARE_PROVIDER_SITE_OTHER): Admitting: Certified Nurse Midwife

## 2020-04-17 VITALS — BP 88/59 | HR 83 | Wt 135.1 lb

## 2020-04-17 DIAGNOSIS — Z3A30 30 weeks gestation of pregnancy: Secondary | ICD-10-CM

## 2020-04-17 LAB — POCT URINALYSIS DIPSTICK OB
Bilirubin, UA: NEGATIVE
Blood, UA: NEGATIVE
Glucose, UA: NEGATIVE
Leukocytes, UA: NEGATIVE
Nitrite, UA: NEGATIVE
Odor: NEGATIVE
POC,PROTEIN,UA: NEGATIVE
Spec Grav, UA: 1.015 (ref 1.010–1.025)
Urobilinogen, UA: 0.2 E.U./dL
pH, UA: 7.5 (ref 5.0–8.0)

## 2020-04-17 NOTE — Patient Instructions (Signed)
[place] [facility] List  [facility]  [address], [place], [place] [address]  Phone: [phone]  [facility] (second location)  [address] [place], [place] [address]  Phone: [phone]  [facility] ([place]) [address], [place], [place] [address] Phone: [phone]  [facility]  [address] , [place] [address]  Phone: [phone] 

## 2020-04-17 NOTE — Progress Notes (Signed)
ROB doing well, feels good fetal movement. U/s for growth 7/13  shows Gestational age of [redacted]w[redacted]d and an (U/S) EDD of 06/29/2020; this correlates with the clinically established Estimated Date of Delivery: 06/23/20. Discussed ready set baby, pt given web link to complete on line training. See pregnancy check list for topics discussed. Pt complains of constipation. Reviewed self help measures, increased hydration, prune juice, stool softer BID . She verbalizes and agrees to plan. Follow up 2 wks.   Doreene Burke, CNM

## 2020-04-17 NOTE — Progress Notes (Signed)
ROB- Constipation x 2 weeks. No meds tried.

## 2020-05-01 ENCOUNTER — Ambulatory Visit (INDEPENDENT_AMBULATORY_CARE_PROVIDER_SITE_OTHER): Admitting: Certified Nurse Midwife

## 2020-05-01 ENCOUNTER — Encounter: Payer: Self-pay | Admitting: Certified Nurse Midwife

## 2020-05-01 VITALS — BP 100/67 | HR 74 | Wt 136.3 lb

## 2020-05-01 DIAGNOSIS — Z3A32 32 weeks gestation of pregnancy: Secondary | ICD-10-CM

## 2020-05-01 LAB — POCT URINALYSIS DIPSTICK OB
Bilirubin, UA: NEGATIVE
Blood, UA: NEGATIVE
Glucose, UA: NEGATIVE
Ketones, UA: NEGATIVE
Leukocytes, UA: NEGATIVE
Nitrite, UA: NEGATIVE
POC,PROTEIN,UA: NEGATIVE
Spec Grav, UA: 1.02 (ref 1.010–1.025)
Urobilinogen, UA: 0.2 E.U./dL
pH, UA: 6.5 (ref 5.0–8.0)

## 2020-05-01 MED ORDER — CITRANATAL HARMONY 27-1-260 MG PO CAPS: 1.0000 | ORAL_CAPSULE | Freq: Every day | ORAL | 11 refills | Status: DC

## 2020-05-01 NOTE — Patient Instructions (Signed)
[place] Pediatrician List  [facility]  [address], [place], [place] [address]  Phone: [phone]  [facility] (second location)  [address] [place], [place] [address]  Phone: [phone]  [facility] ([place]) [address], [place], [place] [address] Phone: [phone]  [facility]  [address] , [place] [address]  Phone: [phone] 

## 2020-05-01 NOTE — Addendum Note (Signed)
Addended by: Mechele Claude on: 05/01/2020 09:36 AM   Modules accepted: Orders

## 2020-05-01 NOTE — Progress Notes (Signed)
ROB doing well. Feels good movement. Pt continues to have issues with consitpation . She has been taking the stool softner, increase liquids, has done prune juice. Discussed use of bulk forming lacitive such as metamucil. She verbalizes and agrees to plan. She state she is feeling a little nausea this morning. Sample of Bonjesta given. Follow up 2 wks or PRN.   Doreene Burke, CNM

## 2020-05-02 ENCOUNTER — Telehealth: Payer: Self-pay | Admitting: Certified Nurse Midwife

## 2020-05-02 ENCOUNTER — Telehealth: Payer: Self-pay

## 2020-05-02 ENCOUNTER — Emergency Department

## 2020-05-02 ENCOUNTER — Other Ambulatory Visit: Payer: Self-pay

## 2020-05-02 DIAGNOSIS — Z3A32 32 weeks gestation of pregnancy: Secondary | ICD-10-CM | POA: Insufficient documentation

## 2020-05-02 DIAGNOSIS — O99513 Diseases of the respiratory system complicating pregnancy, third trimester: Secondary | ICD-10-CM | POA: Diagnosis not present

## 2020-05-02 DIAGNOSIS — Z20822 Contact with and (suspected) exposure to covid-19: Secondary | ICD-10-CM | POA: Diagnosis not present

## 2020-05-02 DIAGNOSIS — R0602 Shortness of breath: Secondary | ICD-10-CM | POA: Diagnosis not present

## 2020-05-02 DIAGNOSIS — Z5321 Procedure and treatment not carried out due to patient leaving prior to being seen by health care provider: Secondary | ICD-10-CM | POA: Insufficient documentation

## 2020-05-02 LAB — CBC
HCT: 32.7 % — ABNORMAL LOW (ref 36.0–46.0)
Hemoglobin: 11.4 g/dL — ABNORMAL LOW (ref 12.0–15.0)
MCH: 30.4 pg (ref 26.0–34.0)
MCHC: 34.9 g/dL (ref 30.0–36.0)
MCV: 87.2 fL (ref 80.0–100.0)
Platelets: 256 10*3/uL (ref 150–400)
RBC: 3.75 MIL/uL — ABNORMAL LOW (ref 3.87–5.11)
RDW: 12.5 % (ref 11.5–15.5)
WBC: 9.3 10*3/uL (ref 4.0–10.5)
nRBC: 0 % (ref 0.0–0.2)

## 2020-05-02 LAB — RESP PANEL BY RT PCR (RSV, FLU A&B, COVID)
Influenza A by PCR: NEGATIVE
Influenza B by PCR: NEGATIVE
Respiratory Syncytial Virus by PCR: NEGATIVE
SARS Coronavirus 2 by RT PCR: NEGATIVE

## 2020-05-02 LAB — BASIC METABOLIC PANEL
Anion gap: 9 (ref 5–15)
BUN: 9 mg/dL (ref 6–20)
CO2: 23 mmol/L (ref 22–32)
Calcium: 8.3 mg/dL — ABNORMAL LOW (ref 8.9–10.3)
Chloride: 105 mmol/L (ref 98–111)
Creatinine, Ser: 0.65 mg/dL (ref 0.44–1.00)
GFR calc Af Amer: >60 mL/min (ref >60)
GFR calc non Af Amer: >60 mL/min (ref >60)
Glucose, Bld: 81 mg/dL (ref 70–99)
Potassium: 3.7 mmol/L (ref 3.5–5.1)
Sodium: 137 mmol/L (ref 135–145)

## 2020-05-02 LAB — TROPONIN I (HIGH SENSITIVITY): Troponin I (High Sensitivity): <2 ng/L (ref <18)

## 2020-05-02 NOTE — Telephone Encounter (Signed)
Patient called the office to report to Christina Ruiz her son was diagnosed with RSV and she was having the same symptoms. Christina Ruiz contacted this Clinical research associate to see what the patient should do. She was advised patient needs to go to ED or Urgent Care due to provider being out of office. When Tabitha relayed this message patient became argumentative. Tabitha then called this Clinical research associate who then picked up the phone. Patient states she was having trouble breathing and she had a headache. She states these are the same symptoms her son displayed. Reiterated she needed to go to the ED or urgent care. Patient asked if they would tell her the same thing they told her about her son- to alternate tylenol and ibuprofen. I have no way of knowing what they will say or do.  My concern is her trouble breathing. She denied a fever. Patient stated she will go as soon as someone comes home to watch her children. Also requested patient let us know how she made out.

## 2020-05-02 NOTE — Telephone Encounter (Signed)
Patient called in stating that her son had tested positive for RSV and that she was told that its dangerous in pregnant women. I called back to nurse and she stated that we do not test for RSV here and that she would need to go to the ED. Patient stated she was having headache, congestion, cough, loss of appetite and difficulty breathing. Patient was advised to go to the ED especially because she was having difficulty breathing. Patient was still confused so nurse picked up the phone line to better assist the patient.

## 2020-05-02 NOTE — ED Triage Notes (Signed)
Pt comes POV with some SOB. Pt [redacted] weeks pregnant. Son recently dx with RSV. Trouble getting out full sentences.

## 2020-05-03 ENCOUNTER — Emergency Department
Admission: EM | Admit: 2020-05-03 | Discharge: 2020-05-03 | Disposition: A | Discharge status: Home / Self Care | Attending: Emergency Medicine | Admitting: Emergency Medicine

## 2020-05-03 LAB — TROPONIN I (HIGH SENSITIVITY): Troponin I (High Sensitivity): 3 ng/L (ref <18)

## 2020-05-07 ENCOUNTER — Other Ambulatory Visit: Payer: Self-pay

## 2020-05-07 ENCOUNTER — Ambulatory Visit (INDEPENDENT_AMBULATORY_CARE_PROVIDER_SITE_OTHER): Admitting: Certified Nurse Midwife

## 2020-05-07 ENCOUNTER — Encounter: Payer: Self-pay | Admitting: Certified Nurse Midwife

## 2020-05-07 VITALS — BP 108/74 | HR 83 | Wt 135.1 lb

## 2020-05-07 DIAGNOSIS — Z3A33 33 weeks gestation of pregnancy: Secondary | ICD-10-CM

## 2020-05-07 DIAGNOSIS — O479 False labor, unspecified: Secondary | ICD-10-CM

## 2020-05-07 NOTE — Patient Instructions (Signed)

## 2020-05-07 NOTE — Progress Notes (Signed)
ROB , feels good fetal movement. Was in the ED over the weekend for congestion & cough. Her son has RSV . She tested positive for RSV. She was given an inhaler and cough medicine. Discussed self help measures. She continues to have constipation. Encouraged her to use mirolax . She agrees to plan. She is requesting SVE she state she has lost her mucus plug and was dilaterd 1 cm. Explained the SVE not recommended due to casueing contractions. Recommend FFN , and visualization of cervix. Appears closed.  She agrees to plan. Follow up 2 wks for ROB or sooner if needed.   Doreene Burke, CNM

## 2020-05-08 LAB — FETAL FIBRONECTIN: Fetal Fibronectin: NEGATIVE

## 2020-05-15 ENCOUNTER — Other Ambulatory Visit: Payer: Self-pay

## 2020-05-15 ENCOUNTER — Ambulatory Visit (INDEPENDENT_AMBULATORY_CARE_PROVIDER_SITE_OTHER): Admitting: Certified Nurse Midwife

## 2020-05-15 ENCOUNTER — Encounter: Payer: Self-pay | Admitting: Certified Nurse Midwife

## 2020-05-15 VITALS — BP 106/63 | HR 74 | Wt 138.4 lb

## 2020-05-15 DIAGNOSIS — Z3A34 34 weeks gestation of pregnancy: Secondary | ICD-10-CM | POA: Diagnosis not present

## 2020-05-15 LAB — POCT URINALYSIS DIPSTICK OB
Bilirubin, UA: NEGATIVE
Blood, UA: NEGATIVE
Glucose, UA: NEGATIVE
Ketones, UA: NEGATIVE
Nitrite, UA: NEGATIVE
POC,PROTEIN,UA: NEGATIVE
Spec Grav, UA: 1.01 (ref 1.010–1.025)
Urobilinogen, UA: 0.2 E.U./dL
pH, UA: 7.5 (ref 5.0–8.0)

## 2020-05-15 NOTE — Progress Notes (Signed)
ROB doing well. Feels good movement. Fundal height 33 discussed u/s for growth. She verbalizes and agrees. Follow up 2 wks or u/s and ROB with Pattricia Boss .   Doreene Burke, CNM

## 2020-05-15 NOTE — Patient Instructions (Signed)
Group B Streptococcus Infection During Pregnancy °Group B Streptococcus (GBS) is a type of bacteria that is often found in healthy people. It is commonly found in the rectum, vagina, and intestines. In people who are healthy and not pregnant, the bacteria rarely cause serious illness or complications. However, women who test positive for GBS during pregnancy can pass the bacteria to the baby during childbirth. This can cause serious infection in the baby after birth. °Women with GBS may also have infections during their pregnancy or soon after childbirth. The infections include urinary tract infections (UTIs) or infections of the uterus. GBS also increases a woman's risk of complications during pregnancy, such as early labor or delivery, miscarriage, or stillbirth. Routine testing for GBS is recommended for all pregnant women. °What are the causes? °This condition is caused by bacteria called Streptococcus agalactiae. °What increases the risk? °You may have a higher risk for GBS infection during pregnancy if you had one during a past pregnancy. °What are the signs or symptoms? °In most cases, GBS infection does not cause symptoms in pregnant women. If symptoms exist, they may include: °· Labor that starts before the 37th week of pregnancy. °· A UTI or bladder infection. This may cause a fever, frequent urination, or pain and burning during urination. °· Fever during labor. There can also be a rapid heartbeat in the mother or baby. °Rare but serious symptoms of a GBS infection in women include: °· Blood infection (septicemia). This may cause fever, chills, or confusion. °· Lung infection (pneumonia). This may cause fever, chills, cough, rapid breathing, chest pain, or difficulty breathing. °· Bone, joint, skin, or soft tissue infection. °How is this diagnosed? °You may be screened for GBS between week 35 and week 37 of pregnancy. If you have symptoms of preterm labor, you may be screened earlier. This condition is  diagnosed based on lab test results from: °· A swab of fluid from the vagina and rectum. °· A urine sample. °How is this treated? °This condition is treated with antibiotic medicine. Antibiotic medicine may be given: °· To you when you go into labor, or as soon as your water breaks. The medicines will continue until after you give birth. If you are having a cesarean delivery, you do not need antibiotics unless your water has broken. °· To your baby, if he or she requires treatment. Your health care provider will check your baby to decide if he or she needs antibiotics to prevent a serious infection. °Follow these instructions at home: °· Take over-the-counter and prescription medicines only as told by your health care provider. °· Take your antibiotic medicine as told by your health care provider. Do not stop taking the antibiotic even if you start to feel better. °· Keep all pre-birth (prenatal) visits and follow-up visits as told by your health care provider. This is important. °Contact a health care provider if: °· You have pain or burning when you urinate. °· You have to urinate more often than usual. °· You have a fever or chills. °· You develop a bad-smelling vaginal discharge. °Get help right away if: °· Your water breaks. °· You go into labor. °· You have severe pain in your abdomen. °· You have difficulty breathing. °· You have chest pain. °These symptoms may represent a serious problem that is an emergency. Do not wait to see if the symptoms will go away. Get medical help right away. Call your local emergency services (911 in the [place] Do not drive yourself to   the hospital. °Summary °· GBS is a type of bacteria that is common in healthy people. °· During pregnancy, colonization with GBS can cause serious complications for you or your baby. °· Your health care provider will screen you between 35 and 37 weeks of pregnancy to determine if you are colonized with GBS. °· If you are colonized with GBS during  pregnancy, your health care provider will recommend antibiotics through an IV during labor. °· After delivery, your baby will be evaluated for complications related to potential GBS infection and may require antibiotics to prevent a serious infection. °This information is not intended to replace advice given to you by your health care provider. Make sure you discuss any questions you have with your health care provider. °Document Revised: 04/04/2019 Document Reviewed: 04/04/2019 °Elsevier Patient Education © 2020 [employer] ° °

## 2020-05-16 ENCOUNTER — Telehealth: Payer: Self-pay | Admitting: Certified Nurse Midwife

## 2020-05-16 ENCOUNTER — Observation Stay
Admission: EM | Admit: 2020-05-16 | Discharge: 2020-05-16 | Disposition: A | Discharge status: Home / Self Care | Attending: Certified Nurse Midwife | Admitting: Certified Nurse Midwife

## 2020-05-16 ENCOUNTER — Encounter: Payer: Self-pay | Admitting: Obstetrics and Gynecology

## 2020-05-16 ENCOUNTER — Other Ambulatory Visit: Payer: Self-pay

## 2020-05-16 DIAGNOSIS — O26893 Other specified pregnancy related conditions, third trimester: Principal | ICD-10-CM | POA: Insufficient documentation

## 2020-05-16 DIAGNOSIS — R109 Unspecified abdominal pain: Secondary | ICD-10-CM | POA: Insufficient documentation

## 2020-05-16 DIAGNOSIS — Z3A34 34 weeks gestation of pregnancy: Secondary | ICD-10-CM

## 2020-05-16 DIAGNOSIS — O219 Vomiting of pregnancy, unspecified: Secondary | ICD-10-CM | POA: Diagnosis not present

## 2020-05-16 LAB — URINALYSIS, ROUTINE W REFLEX MICROSCOPIC
Bilirubin Urine: NEGATIVE
Glucose, UA: NEGATIVE mg/dL
Hgb urine dipstick: NEGATIVE
Ketones, ur: 80 mg/dL — AB
Nitrite: NEGATIVE
Protein, ur: NEGATIVE mg/dL
Specific Gravity, Urine: 1.02 (ref 1.005–1.030)
pH: 6 (ref 5.0–8.0)

## 2020-05-16 LAB — TYPE AND SCREEN
ABO/RH(D): A POS
Antibody Screen: NEGATIVE

## 2020-05-16 MED ORDER — BUTALBITAL-APAP-CAFFEINE 50-325-40 MG PO TABS
1.0000 | ORAL_TABLET | Freq: Once | ORAL | Status: AC
  Administered 2020-05-16: 1 via ORAL
  Filled 2020-05-16: qty 1

## 2020-05-16 MED ORDER — ONDANSETRON HCL 4 MG/2ML IJ SOLN
4.0000 mg | Freq: Four times a day (QID) | INTRAMUSCULAR | Status: DC | PRN
  Administered 2020-05-16: 4 mg via INTRAVENOUS
  Filled 2020-05-16: qty 2

## 2020-05-16 MED ORDER — LACTATED RINGERS IV SOLN: INTRAVENOUS | Status: DC

## 2020-05-16 MED ORDER — ONDANSETRON 4 MG PO TBDP: 4.0000 mg | ORAL_TABLET | Freq: Three times a day (TID) | ORAL | 0 refills | Status: DC | PRN

## 2020-05-16 MED ORDER — LACTATED RINGERS IV SOLN: 500.0000 mL | INTRAVENOUS | Status: DC | PRN

## 2020-05-16 MED ORDER — BUTALBITAL-APAP-CAFFEINE 50-325-40 MG PO TABS: 1.0000 | ORAL_TABLET | Freq: Four times a day (QID) | ORAL | 0 refills | Status: DC | PRN

## 2020-05-16 NOTE — Telephone Encounter (Signed)
Pt called in and stated that she has been throwing up all night and has diarrhea. I told the pt to please hold and called back to [facility]. I told Jasmine December what the pt told me and Jasmine December stated that she can head over to the ED and they will give her fluids. I got back on the phone and let the pt know what the nurse told me. The pt verbally understood.

## 2020-05-16 NOTE — OB Triage Note (Signed)
    L&D OB Triage Note  SUBJECTIVE Christina Ruiz is a 29 y.o. G3P1011 female at [redacted]w[redacted]d, EDD Estimated Date of Delivery: 06/23/20 who presented to triage with complaints of nausea, vomiting and diarrhea through the night. She denies contractions, leaking of fluid or vaginal bleeding. Endorses good fetal movement. She states she had left over [employer] last night . She denies any vomiting or diarrhea today. She does complain of a headache.   OB History  Gravida Para Term Preterm AB Living  3 1 1  0 1 1  SAB TAB Ectopic Multiple Live Births  1 0 0 0 1    # Outcome Date GA Lbr Len/2nd Weight Sex Delivery Anes PTL Lv  3 Current           2 SAB 2020          1 Term 02/25/18 [redacted]w[redacted]d / 01:08 3572 g M Vag-Spont EPI  LIV     Birth Comments: none     Name: Sattar,BOY Alyze     Apgar1: 8  Apgar5: 9    Medications Prior to Admission  Medication Sig Dispense Refill Last Dose  . FLUoxetine (PROZAC) 20 MG capsule Take 20 mg by mouth daily.   05/15/2020 at Unknown time  . Prenat-FeFmCb-DSS-FA-DHA w/o A (CITRANATAL HARMONY) 27-1-260 MG CAPS Take 1 tablet by mouth daily. 30 capsule 11 05/15/2020 at Unknown time  . Albuterol Sulfate (PROAIR RESPICLICK) 108 (90 Base) MCG/ACT AEPB Inhale into the lungs. (Patient not taking: Reported on 05/15/2020)   Not Taking at Unknown time     OBJECTIVE  Nursing Evaluation:   BP 116/64 (BP Location: Right Arm)   Pulse 86   Temp 98.6 F (37 C) (Oral)   Resp 20   Ht 5\' 4"  (1.626 m)   Wt 61.2 kg   LMP 09/17/2019 (Exact Date)   SpO2 99%   BMI 23.17 kg/m    Findings: Food poising vs. GI virus. . IV hydration, zofran given.    NST was performed and has been reviewed by me.  NST INTERPRETATION: Category I  Mode: External Baseline Rate (A): 130 bpm Variability: Moderate Accelerations: 15 x 15 Decelerations: None     Contraction Frequency (min):  occasional w/UI  ASSESSMENT Impression:  1.  Pregnancy:  G3P1011 at [redacted]w[redacted]d , EDD Estimated Date of Delivery:  06/23/20 2.  Reassuring fetal and maternal status 3.  Food poisoning vs. GI virus.    PLAN 1. Discussed current condition and above findings with patient and reassurance given.  All questions answered.Encouraged small frequent sips of water, small bland meals. Zofran ordered and tylenol as needed for headache. Pt given a 5 Fioricet for migraine headache.  2. Discharge home with standard labor precautions given to return to L&D or call the office for problems. 3. Continue routine prenatal care.      [redacted]w[redacted]d, CNM

## 2020-05-16 NOTE — Discharge Summary (Signed)
Rn reviewed discharge instructions with patient. Gave patient opportunity for questions. All questions answered at this time. Pt discharged home with her friend via wheelchair.

## 2020-05-16 NOTE — OB Triage Note (Addendum)
Pt presents c/o urinary symptoms including burning, and frequent urination. Pt states she feels dehydrated but is till voiding a lot. She states she has had some light headedness, and blurry vision as well. Pt reports abdominal cramping that comes and goes along with a headache also. Pt denies bleeding or LOF. Reports positive fetal movement. VSS. Will continue to monitor.

## 2020-05-17 ENCOUNTER — Other Ambulatory Visit: Payer: Self-pay | Admitting: Certified Nurse Midwife

## 2020-05-17 MED ORDER — NITROFURANTOIN MONOHYD MACRO 100 MG PO CAPS
100.0000 mg | ORAL_CAPSULE | Freq: Two times a day (BID) | ORAL | 0 refills | Status: AC
Start: 2020-05-17 — End: 2020-05-24

## 2020-05-18 LAB — URINE CULTURE: Culture: 10000 — AB

## 2020-05-24 ENCOUNTER — Encounter: Payer: Self-pay | Admitting: Certified Nurse Midwife

## 2020-05-24 ENCOUNTER — Ambulatory Visit (INDEPENDENT_AMBULATORY_CARE_PROVIDER_SITE_OTHER): Admitting: Certified Nurse Midwife

## 2020-05-24 ENCOUNTER — Other Ambulatory Visit: Payer: Self-pay

## 2020-05-24 VITALS — BP 110/72 | HR 92

## 2020-05-24 DIAGNOSIS — Z3A35 35 weeks gestation of pregnancy: Secondary | ICD-10-CM | POA: Diagnosis not present

## 2020-05-24 DIAGNOSIS — O479 False labor, unspecified: Secondary | ICD-10-CM | POA: Diagnosis not present

## 2020-05-24 DIAGNOSIS — Z3493 Encounter for supervision of normal pregnancy, unspecified, third trimester: Secondary | ICD-10-CM | POA: Diagnosis not present

## 2020-05-24 DIAGNOSIS — Z113 Encounter for screening for infections with a predominantly sexual mode of transmission: Secondary | ICD-10-CM

## 2020-05-24 DIAGNOSIS — Z3685 Encounter for antenatal screening for Streptococcus B: Secondary | ICD-10-CM

## 2020-05-24 LAB — POCT URINALYSIS DIPSTICK OB
Bilirubin, UA: NEGATIVE
Blood, UA: NEGATIVE
Glucose, UA: NEGATIVE
Leukocytes, UA: NEGATIVE
Nitrite, UA: NEGATIVE
POC,PROTEIN,UA: NEGATIVE
Spec Grav, UA: 1.02 (ref 1.010–1.025)
Urobilinogen, UA: 0.2 E.U./dL
pH, UA: 6.5 (ref 5.0–8.0)

## 2020-05-24 MED ORDER — BETAMETHASONE SOD PHOS & ACET 6 (3-3) MG/ML IJ SUSP
12.0000 mg | Freq: Once | INTRAMUSCULAR | Status: AC
  Administered 2020-05-24: 12 mg via INTRAMUSCULAR

## 2020-05-24 MED ORDER — HYDROXYZINE HCL 25 MG PO TABS
25.0000 mg | ORAL_TABLET | Freq: Four times a day (QID) | ORAL | 0 refills | Status: DC | PRN
Start: 2020-05-24 — End: 2020-06-05

## 2020-05-24 NOTE — Progress Notes (Signed)
Subjective:   Christina Ruiz is a 29 y.o. G3P1011 [redacted]w[redacted]d being seen today for work in problem prenatal visit.  Patient reports contractions every five (5) to six (6) minutes of varying strengths since last night as well as lower back pain, pelvic pain, diarrhea and intermittent abdominal cramping with loss of mucous plug.   Denies vaginal bleeding or leaking of fluid.  Reports good fetal movement.  Denies difficulty breathing or respiratory distress, chest pain, dysuria, and leg pain or swelling.   The following portions of the patient's history were reviewed and updated as appropriate: allergies, current medications, past family history, past medical history, past social history, past surgical history and problem list.   Review of Systems:  ROS negative except as noted above. Information obtained from patient.   Objective:   BP 110/72   Pulse 92   LMP 09/17/2019 (Exact Date)   FHT: Fetal Heart Rate (bpm): 130  Fetal Movement: Movement: Present    Abdomen:  soft, gravid, appropriate for gestational age,non-tender  Vaginal:  clear discharge present  Cervix: 1.5 cm/50/-3, vertex   No results found for this or any previous visit (from the past 24 hour(s)).  Assessment:   Pregnancy:  G3P1011 at [redacted]w[redacted]d  1. [redacted] weeks gestation of pregnancy  - POC Urinalysis Dipstick OB  2. Third trimester pregnancy at less than 36 weeks   3. Braxton Hick's contraction   4. Antenatal screening for Group Beta Strep   5. STI screening    Plan:   Rx: Vistaril, see orders.   Encouraged home treatment measures.   Betamethasone, first dose today; see chart.   Preterm labor symptoms: vaginal bleeding, contractions and leaking of fluid reviewed in detail.  Fetal movement precautions reviewed.  Reviewed red flag symptoms and when to call.   Follow up in 1 day for second dose of betamethasone.   Serafina Royals, CNM [facility], [facility] 05/25/20 4:45 PM

## 2020-05-24 NOTE — Progress Notes (Signed)
Pt given betamethasone 12 mg per Serafina Royals, CNM. Pt tolerated weel. No adverse reactions

## 2020-05-24 NOTE — Patient Instructions (Addendum)
Braxton Hicks Contractions Contractions of the uterus can occur throughout pregnancy, but they are not always a sign that you are in labor. You may have practice contractions called Braxton Hicks contractions. These false labor contractions are sometimes confused with true labor. What are [place] contractions? Braxton Hicks contractions are tightening movements that occur in the muscles of the uterus before labor. Unlike true labor contractions, these contractions do not result in opening (dilation) and thinning of the cervix. Toward the end of pregnancy (32-34 weeks), Braxton Hicks contractions can happen more often and may become stronger. These contractions are sometimes difficult to tell apart from true labor because they can be very uncomfortable. You should not feel embarrassed if you go to the hospital with false labor. Sometimes, the only way to tell if you are in true labor is for your health care provider to look for changes in the cervix. The health care provider will do a physical exam and may monitor your contractions. If you are not in true labor, the exam should show that your cervix is not dilating and your water has not broken. If there are no other health problems associated with your pregnancy, it is completely safe for you to be sent home with false labor. You may continue to have Braxton Hicks contractions until you go into true labor. How to tell the difference between true labor and false labor True labor  Contractions last 30-70 seconds.  Contractions become very regular.  Discomfort is usually felt in the top of the uterus, and it spreads to the lower abdomen and low back.  Contractions do not go away with walking.  Contractions usually become more intense and increase in frequency.  The cervix dilates and gets thinner. False labor  Contractions are usually shorter and not as strong as true labor contractions.  Contractions are usually irregular.  Contractions  are often felt in the front of the lower abdomen and in the groin.  Contractions may go away when you walk around or change positions while lying down.  Contractions get weaker and are shorter-lasting as time goes on.  The cervix usually does not dilate or become thin. Follow these instructions at home:   Take over-the-counter and prescription medicines only as told by your health care provider.  Keep up with your usual exercises and follow other instructions from your health care provider.  Eat and drink lightly if you think you are going into labor.  If Braxton Hicks contractions are making you uncomfortable: ? Change your position from lying down or resting to walking, or change from walking to resting. ? Sit and rest in a tub of warm water. ? Drink enough fluid to keep your urine pale yellow. Dehydration may cause these contractions. ? Do slow and deep breathing several times an hour.  Keep all follow-up prenatal visits as told by your health care provider. This is important. Contact a health care provider if:  You have a fever.  You have continuous pain in your abdomen. Get help right away if:  Your contractions become stronger, more regular, and closer together.  You have fluid leaking or gushing from your vagina.  You pass blood-tinged mucus (bloody show).  You have bleeding from your vagina.  You have low back pain that you never had before.  You feel your baby's head pushing down and causing pelvic pressure.  Your baby is not moving inside you as much as it used to. Summary  Contractions that occur before labor are  called Braxton Hicks contractions, false labor, or practice contractions.  Braxton Hicks contractions are usually shorter, weaker, farther apart, and less regular than true labor contractions. True labor contractions usually become progressively stronger and regular, and they become more frequent.  Manage discomfort from [facility] contractions  by changing position, resting in a warm bath, drinking plenty of water, or practicing deep breathing. This information is not intended to replace advice given to you by your health care provider. Make sure you discuss any questions you have with your health care provider. Document Revised: 08/21/2017 Document Reviewed: 01/22/2017 Elsevier Patient Education  [place].    Fetal Movement Counts Patient Name: ________________________________________________ Patient Due Date: ____________________ What is a fetal movement count?  A fetal movement count is the number of times that you feel your baby move during a certain amount of time. This may also be called a fetal kick count. A fetal movement count is recommended for every pregnant woman. You may be asked to start counting fetal movements as early as week 28 of your pregnancy. Pay attention to when your baby is most active. You may notice your baby's sleep and wake cycles. You may also notice things that make your baby move more. You should do a fetal movement count:  When your baby is normally most active.  At the same time each day. A good time to count movements is while you are resting, after having something to eat and drink. How do I count fetal movements? 1. Find a quiet, comfortable area. Sit, or lie down on your side. 2. Write down the date, the start time and stop time, and the number of movements that you felt between those two times. Take this information with you to your health care visits. 3. Write down your start time when you feel the first movement. 4. Count kicks, flutters, swishes, rolls, and jabs. You should feel at least 10 movements. 5. You may stop counting after you have felt 10 movements, or if you have been counting for 2 hours. Write down the stop time. 6. If you do not feel 10 movements in 2 hours, contact your health care provider for further instructions. Your health care provider may want to do additional  tests to assess your baby's well-being. Contact a health care provider if:  You feel fewer than 10 movements in 2 hours.  Your baby is not moving like he or she usually does. Date: ____________ Start time: ____________ Stop time: ____________ Movements: ____________ Date: ____________ Start time: ____________ Stop time: ____________ Movements: ____________ Date: ____________ Start time: ____________ Stop time: ____________ Movements: ____________ Date: ____________ Start time: ____________ Stop time: ____________ Movements: ____________ Date: ____________ Start time: ____________ Stop time: ____________ Movements: ____________ Date: ____________ Start time: ____________ Stop time: ____________ Movements: ____________ Date: ____________ Start time: ____________ Stop time: ____________ Movements: ____________ Date: ____________ Start time: ____________ Stop time: ____________ Movements: ____________ Date: ____________ Start time: ____________ Stop time: ____________ Movements: ____________ This information is not intended to replace advice given to you by your health care provider. Make sure you discuss any questions you have with your health care provider. Document Revised: 04/28/2019 Document Reviewed: 04/28/2019 Elsevier Patient Education  [place].    Preventing Preterm Birth Preterm birth is when your baby is delivered between 22 weeks and 37 weeks of pregnancy. A full-term pregnancy lasts for at least 37 weeks. Preterm birth can be dangerous for your baby because the last few weeks of pregnancy are an important  time for your baby's brain and lungs to grow. Many things can cause a baby to be born early. Sometimes the cause is not known. There are certain factors that make you more likely to experience preterm birth, such as:  Having a previous baby born preterm.  Being pregnant with twins or other multiples.  Having had fertility treatment.  Being overweight or underweight  at the start of your pregnancy.  Having any of the following during pregnancy: ? An infection, including a urinary tract infection (UTI) or an STI (sexually transmitted infection). ? High blood pressure. ? Diabetes. ? Vaginal bleeding.  Being age 68 or older.  Being age 34 or younger.  Getting pregnant within 6 months of a previous pregnancy.  Suffering extreme stress or physical or emotional abuse during pregnancy.  Standing for long periods of time during pregnancy, such as working at a job that requires standing. What are the risks? The most serious risk of preterm birth is that the baby may not survive. This is more likely to happen if a baby is born before 56 weeks. Other risks and complications of preterm birth may include your baby having:  Breathing problems.  Brain damage that affects movement and coordination (cerebral palsy).  Feeding difficulties.  Vision or hearing problems.  Infections or inflammation of the digestive tract (colitis).  Developmental delays.  Learning disabilities.  Higher risk for diabetes, heart disease, and high blood pressure later in life. What can I do to lower my risk?  Medical care The most important thing you can do to lower your risk for preterm birth is to get routine medical care during pregnancy (prenatal care). If you have a high risk of preterm birth, you may be referred to a health care provider who specializes in managing high-risk pregnancies (perinatologist). You may be given medicine to help prevent preterm birth. Lifestyle changes Certain lifestyle changes can also lower your risk of preterm birth:  Wait at least 6 months after a pregnancy to become pregnant again.  Try to plan pregnancy for when you are between 63 and 74 years old.  Get to a healthy weight before getting pregnant. If you are overweight, work with your health care provider to safely lose weight.  Do not use any products that contain nicotine or  tobacco, such as cigarettes and e-cigarettes. If you need help quitting, ask your health care provider.  Do not drink alcohol.  Do not use drugs. Where to find support For more support, consider:  Talking with your health care provider.  Talking with a therapist or substance abuse counselor, if you need help quitting.  Working with a diet and nutrition specialist (dietitian) or a Physiological scientist to maintain a healthy weight.  Joining a support group. Where to find more information Learn more about preventing preterm birth from:  Centers for Disease Control and Prevention: VoipObserver.com.br  March of Dimes: marchofdimes.org/complications/premature-babies.aspx  American Pregnancy Association: americanpregnancy.org/labor-and-birth/premature-labor Contact a health care provider if:  You have any of the following signs of preterm labor before 37 weeks: ? A change or increase in vaginal discharge. ? Fluid leaking from your vagina. ? Pressure or cramps in your lower abdomen. ? A backache that does not go away or gets worse. ? Regular tightening (contractions) in your lower abdomen. Summary  Preterm birth means having your baby during weeks 33-37 of pregnancy.  Preterm birth may put your baby at risk for physical and mental problems.  Getting good prenatal care can help prevent preterm birth.  You can lower your risk of preterm birth by making certain lifestyle changes, such as not smoking and not using alcohol. This information is not intended to replace advice given to you by your health care provider. Make sure you discuss any questions you have with your health care provider. Document Revised: 08/21/2017 Document Reviewed: 05/17/2016 Elsevier Patient Education  [place].   Betamethasone injection What is this medicine? BETAMETHASONE (bay ta METH a sone) is a corticosteroid. It helps to reduce swelling, redness,  itching, and allergic reactions. It is used to treat asthma, allergies, arthritis, Crohn's disease, and ulcerative colitis. It is also used for other conditions, like blood disorders and diseases of the adrenal glands. This medicine may be used for other purposes; ask your health care provider or pharmacist if you have questions. COMMON BRAND NAME(S): Adbeon, Beta 1 Kit, BSP 0820, [place] What should I tell my health care provider before I take this medicine? They need to know if you have any of these conditions:  blood clotting problems  Cushing's syndrome  diabetes  eye disease, vision problems  glaucoma or cataracts  heart problems or disease  high blood pressure  infection like chickenpox, fungus, herpes, measles, or tuberculosis  kidney disease  liver disease  mental problems  myasthenia gravis  osteoporosis  seizures  stomach, intestinal disease  an unusual or allergic reaction to betamethasone, corticosteroids, other medicines, foods, dyes, or preservatives  pregnant or trying to get pregnant  breast-feeding How should I use this medicine? This medicine is for injection into a muscle, joint, lesion, or other tissue. It is given by a health care professional in a hospital or clinic setting. Talk to your pediatrician regarding the use of this medicine in children. Special care may be needed. Overdosage: If you think you have taken too much of this medicine contact a poison control center or emergency room at once. NOTE: This medicine is only for you. Do not share this medicine with others. What if I miss a dose? This does not apply. What may interact with this medicine? Do not take this medicine with any of the following medications:  mifepristone This medicine may also interact with the following medications:  aspirin  vaccines  warfarin This list may not describe all possible interactions. Give your health care provider a list of all the medicines,  herbs, non-prescription drugs, or dietary supplements you use. Also tell them if you smoke, drink alcohol, or use illegal drugs. Some items may interact with your medicine. What should I watch for while using this medicine? Visit your doctor or health care professional for regular checks on your progress. Tell your doctor or healthcare professional if your symptoms do not start to get better or if they get worse. If you are taking this medicine over a prolonged period, carry an identification card with your name and address, the type and dose of your medicine, and your doctor's name and address. This medicine may increase your risk of getting an infection. Stay away from people who are sick. Tell your doctor or health care professional if you are around anyone with measles or chickenpox. If you are going to have surgery, tell your doctor or health care professional that you have taken this medicine within the last twelve months. Ask your doctor or health care professional about your diet. You may need to lower the amount of salt you eat. This medicine may increase blood sugar. Ask your healthcare provider if changes in diet or medicines are  needed if you have diabetes. You may need to avoid immunization with certain vaccines while you are taking this medicine. Tell your doctor if you have taken this medicine before receiving any vaccine. What side effects may I notice from receiving this medicine? Side effects that you should report to your doctor or health care professional as soon as possible:  allergic reactions like skin rash, itching or hives, swelling of the face, lips, or tongue  black, tarry stools  breathing problems  bulging eyes  fever, sore throat, infection, sores that do not heal  frequent passing of urine  high blood pressure  pain in hips, back, ribs, arms, shoulders, or legs  signs and symptoms of high blood sugar such as being more thirsty or hungry or having to urinate  more than normal. You may also feel very tired or have blurry vision.  swelling of feet or lower legs Side effects that usually do not require medical attention (report to your doctor or health care professional if they continue or are bothersome):  confusion, excitement, restlessness  headache  nausea, vomiting  skin problems, acne, thin and shiny skin  stomach upset  trouble sleeping  weight gain This list may not describe all possible side effects. Call your doctor for medical advice about side effects. You may report side effects to FDA at 1-800-FDA-1088. Where should I keep my medicine? This drug is given in a hospital or clinic and will not be stored at home. NOTE: This sheet is a summary. It may not cover all possible information. If you have questions about this medicine, talk to your doctor, pharmacist, or health care provider.  2020 Elsevier/Gold Standard (2018-06-09 09:53:14)   Hydroxyzine capsules or tablets What is this medicine? HYDROXYZINE (hye Cowlic i zeen) is an antihistamine. This medicine is used to treat allergy symptoms. It is also used to treat anxiety and tension. This medicine can be used with other medicines to induce sleep before surgery. This medicine may be used for other purposes; ask your health care provider or pharmacist if you have questions. COMMON BRAND NAME(S): ANX, Atarax, Rezine, Vistaril What should I tell my health care provider before I take this medicine? They need to know if you have any of these conditions:  glaucoma  heart disease  history of irregular heartbeat  kidney disease  liver disease  lung or breathing disease, like asthma  stomach or intestine problems  thyroid disease  trouble passing urine  an unusual or allergic reaction to hydroxyzine, cetirizine, other medicines, foods, dyes or preservatives  pregnant or trying to get pregnant  breast-feeding How should I use this medicine? Take this medicine by mouth  with a full glass of water. Follow the directions on the prescription label. You may take this medicine with food or on an empty stomach. Take your medicine at regular intervals. Do not take your medicine more often than directed. Talk to your pediatrician regarding the use of this medicine in children. Special care may be needed. While this drug may be prescribed for children as young as 18 years of age for selected conditions, precautions do apply. Patients over 53 years old may have a stronger reaction and need a smaller dose. Overdosage: If you think you have taken too much of this medicine contact a poison control center or emergency room at once. NOTE: This medicine is only for you. Do not share this medicine with others. What if I miss a dose? If you miss a dose, take it as  soon as you can. If it is almost time for your next dose, take only that dose. Do not take double or extra doses. What may interact with this medicine? Do not take this medicine with any of the following medications:  cisapride  dronedarone  pimozide  thioridazine This medicine may also interact with the following medications:  alcohol  antihistamines for allergy, cough, and cold  atropine  barbiturate medicines for sleep or seizures, like phenobarbital  certain antibiotics like erythromycin or clarithromycin  certain medicines for anxiety or sleep  certain medicines for bladder problems like oxybutynin, tolterodine  certain medicines for depression or psychotic disturbances  certain medicines for irregular heart beat  certain medicines for Parkinson's disease like benztropine, trihexyphenidyl  certain medicines for seizures like phenobarbital, primidone  certain medicines for stomach problems like dicyclomine, hyoscyamine  certain medicines for travel sickness like scopolamine  ipratropium  narcotic medicines for pain  other medicines that prolong the QT interval (an abnormal heart rhythm)  like dofetilide This list may not describe all possible interactions. Give your health care provider a list of all the medicines, herbs, non-prescription drugs, or dietary supplements you use. Also tell them if you smoke, drink alcohol, or use illegal drugs. Some items may interact with your medicine. What should I watch for while using this medicine? Tell your doctor or health care professional if your symptoms do not improve. You may get drowsy or dizzy. Do not drive, use machinery, or do anything that needs mental alertness until you know how this medicine affects you. Do not stand or sit up quickly, especially if you are an older patient. This reduces the risk of dizzy or fainting spells. Alcohol may interfere with the effect of this medicine. Avoid alcoholic drinks. Your mouth may get dry. Chewing sugarless gum or sucking hard candy, and drinking plenty of water may help. Contact your doctor if the problem does not go away or is severe. This medicine may cause dry eyes and blurred vision. If you wear contact lenses you may feel some discomfort. Lubricating drops may help. See your eye doctor if the problem does not go away or is severe. If you are receiving skin tests for allergies, tell your doctor you are using this medicine. What side effects may I notice from receiving this medicine? Side effects that you should report to your doctor or health care professional as soon as possible:  allergic reactions like skin rash, itching or hives, swelling of the face, lips, or tongue  changes in vision  confusion  fast, irregular heartbeat  seizures  tremor  trouble passing urine or change in the amount of urine Side effects that usually do not require medical attention (report to your doctor or health care professional if they continue or are bothersome):  constipation  drowsiness  dry mouth  headache  tiredness This list may not describe all possible side effects. Call your doctor  for medical advice about side effects. You may report side effects to FDA at 1-800-FDA-1088. Where should I keep my medicine? Keep out of the reach of children. Store at room temperature between 15 and 30 degrees C (59 and 86 degrees F). Keep container tightly closed. Throw away any unused medicine after the expiration date. NOTE: This sheet is a summary. It may not cover all possible information. If you have questions about this medicine, talk to your doctor, pharmacist, or health care provider.  2020 Elsevier/Gold Standard (2018-08-30 13:19:55)

## 2020-05-25 ENCOUNTER — Encounter: Admitting: Certified Nurse Midwife

## 2020-05-25 ENCOUNTER — Ambulatory Visit (INDEPENDENT_AMBULATORY_CARE_PROVIDER_SITE_OTHER): Admitting: Certified Nurse Midwife

## 2020-05-25 DIAGNOSIS — Z3493 Encounter for supervision of normal pregnancy, unspecified, third trimester: Secondary | ICD-10-CM

## 2020-05-25 MED ORDER — BETAMETHASONE SOD PHOS & ACET 6 (3-3) MG/ML IJ SUSP
12.0000 mg | Freq: Once | INTRAMUSCULAR | Status: AC
  Administered 2020-05-25: 12 mg via INTRAMUSCULAR

## 2020-05-25 NOTE — Progress Notes (Signed)
SVE completed per patient request, unchanged since yesterday. Discussed home treatment measures. Reviewed red flag symptoms and when to call. Encouraged to go to hospital with persistent symptoms. RTC x 1 week for ROB with ANNIE as scheduled or sooner if needed.   Christina Ruiz, CNM [facility], [facility]

## 2020-05-25 NOTE — Progress Notes (Signed)
OB-Pt present for 2nd dose of Betamethasone. Pt requested to see JML to check her cervix.

## 2020-05-26 LAB — STREP GP B NAA: Strep Gp B NAA: NEGATIVE

## 2020-05-27 ENCOUNTER — Other Ambulatory Visit: Payer: Self-pay

## 2020-05-27 ENCOUNTER — Encounter: Payer: Self-pay | Admitting: Obstetrics and Gynecology

## 2020-05-27 ENCOUNTER — Observation Stay
Admission: EM | Admit: 2020-05-27 | Discharge: 2020-05-27 | Disposition: A | Discharge status: Home / Self Care | Attending: Certified Nurse Midwife | Admitting: Certified Nurse Midwife

## 2020-05-27 DIAGNOSIS — O4703 False labor before 37 completed weeks of gestation, third trimester: Principal | ICD-10-CM | POA: Insufficient documentation

## 2020-05-27 DIAGNOSIS — Z3A36 36 weeks gestation of pregnancy: Secondary | ICD-10-CM | POA: Diagnosis not present

## 2020-05-27 DIAGNOSIS — O4700 False labor before 37 completed weeks of gestation, unspecified trimester: Secondary | ICD-10-CM | POA: Diagnosis not present

## 2020-05-27 DIAGNOSIS — O26893 Other specified pregnancy related conditions, third trimester: Secondary | ICD-10-CM | POA: Diagnosis present

## 2020-05-27 LAB — CBC WITH DIFFERENTIAL/PLATELET
Abs Immature Granulocytes: 0.1 10*3/uL — ABNORMAL HIGH (ref 0.00–0.07)
Basophils Absolute: 0 10*3/uL (ref 0.0–0.1)
Basophils Relative: 0 %
Eosinophils Absolute: 0 10*3/uL (ref 0.0–0.5)
Eosinophils Relative: 0 %
HCT: 30.3 % — ABNORMAL LOW (ref 36.0–46.0)
Hemoglobin: 10.1 g/dL — ABNORMAL LOW (ref 12.0–15.0)
Immature Granulocytes: 1 %
Lymphocytes Relative: 24 %
Lymphs Abs: 2 10*3/uL (ref 0.7–4.0)
MCH: 29.5 pg (ref 26.0–34.0)
MCHC: 33.3 g/dL (ref 30.0–36.0)
MCV: 88.6 fL (ref 80.0–100.0)
Monocytes Absolute: 0.7 10*3/uL (ref 0.1–1.0)
Monocytes Relative: 8 %
Neutro Abs: 5.8 10*3/uL (ref 1.7–7.7)
Neutrophils Relative %: 67 %
Platelets: 306 10*3/uL (ref 150–400)
RBC: 3.42 MIL/uL — ABNORMAL LOW (ref 3.87–5.11)
RDW: 12.4 % (ref 11.5–15.5)
WBC: 8.7 10*3/uL (ref 4.0–10.5)
nRBC: 0 % (ref 0.0–0.2)

## 2020-05-27 LAB — URINALYSIS, ROUTINE W REFLEX MICROSCOPIC
Bilirubin Urine: NEGATIVE
Glucose, UA: NEGATIVE mg/dL
Hgb urine dipstick: NEGATIVE
Ketones, ur: NEGATIVE mg/dL
Leukocytes,Ua: NEGATIVE
Nitrite: NEGATIVE
Protein, ur: NEGATIVE mg/dL
Specific Gravity, Urine: 1.015 (ref 1.005–1.030)
pH: 6 (ref 5.0–8.0)

## 2020-05-27 MED ORDER — OXYCODONE-ACETAMINOPHEN 5-325 MG PO TABS
1.0000 | ORAL_TABLET | Freq: Four times a day (QID) | ORAL | Status: DC | PRN
  Administered 2020-05-27: 2 via ORAL
  Filled 2020-05-27: qty 2

## 2020-05-27 MED ORDER — ACETAMINOPHEN 325 MG PO TABS
650.0000 mg | ORAL_TABLET | Freq: Four times a day (QID) | ORAL | Status: AC | PRN
  Administered 2020-05-27: 650 mg via ORAL
  Filled 2020-05-27: qty 2

## 2020-05-27 NOTE — OB Triage Note (Signed)
    L&D OB Triage Note  SUBJECTIVE Christina Ruiz is a 29 y.o. G3P1011 female at [redacted]w[redacted]d, EDD Estimated Date of Delivery: 06/23/20 who presented to triage with complaints of back pain , left lower quadrant pain , and occasional cramping.   OB History  Gravida Para Term Preterm AB Living  3 1 1  0 1 1  SAB TAB Ectopic Multiple Live Births  1 0 0 0 1    # Outcome Date GA Lbr Len/2nd Weight Sex Delivery Anes PTL Lv  3 Current           2 SAB 2020          1 Term 02/25/18 [redacted]w[redacted]d / 01:08 3572 g M Vag-Spont EPI  LIV     Birth Comments: none     Name: Runion,BOY Ayat     Apgar1: 8  Apgar5: 9    Medications Prior to Admission  Medication Sig Dispense Refill Last Dose  . Albuterol Sulfate (PROAIR RESPICLICK) 108 (90 Base) MCG/ACT AEPB Inhale into the lungs.    Past Month at Unknown time  . FLUoxetine (PROZAC) 20 MG capsule Take 20 mg by mouth daily.   05/26/2020 at Unknown time  . Prenat-FeFmCb-DSS-FA-DHA w/o A (CITRANATAL HARMONY) 27-1-260 MG CAPS Take 1 tablet by mouth daily. 30 capsule 11 05/26/2020 at Unknown time  . butalbital-acetaminophen-caffeine (FIORICET) 50-325-40 MG tablet Take 1-2 tablets by mouth every 6 (six) hours as needed for headache. (Patient not taking: Reported on 05/27/2020) 5 tablet 0 Not Taking at Unknown time  . hydrOXYzine (ATARAX/VISTARIL) 25 MG tablet Take 1 tablet (25 mg total) by mouth every 6 (six) hours as needed (contractions). (Patient not taking: Reported on 05/27/2020) 30 tablet 0 Not Taking at Unknown time  . ondansetron (ZOFRAN-ODT) 4 MG disintegrating tablet Take 1 tablet (4 mg total) by mouth every 8 (eight) hours as needed for nausea or vomiting. (Patient not taking: Reported on 05/24/2020) 20 tablet 0 Not Taking at Unknown time     OBJECTIVE  Nursing Evaluation:   BP 104/63 (BP Location: Left Arm)   Pulse 60   Temp 98.6 F (37 C) (Oral)   Resp 16   Ht 5\' 4"  (1.626 m)   Wt 59.9 kg   LMP 09/17/2019 (Approximate)   BMI 22.66 kg/m    Findings:   Braxton  hicks contractions, round ligament pain      NST was performed and has been reviewed by me.  NST INTERPRETATION: Category I  Mode: External Baseline Rate (A): 130 bpm Variability: Moderate Accelerations: 15 x 15 Decelerations: None     Contraction Frequency (min): Intermittent- 6 or more mins apart  ASSESSMENT Impression:  1.  Pregnancy:  G3P1011 at [redacted]w[redacted]d , EDD Estimated Date of Delivery: 06/23/20 2.  Reassuring fetal and maternal status 3.  Round ligament pain, braxton hicks ctx.  PLAN 1. Discussed current condition and above findings with patient and reassurance given.  All questions answered. 2. Discharge home with standard labor precautions given to return to L&D or call the office for problems. 3. Continue routine prenatal care , follow up as scheduled in office on Tuesday.   08/23/20, CNM

## 2020-05-27 NOTE — OB Triage Note (Signed)
Back pain/Left lower quadrant pain since Thursday, went to office Thursday and Friday, instructed to come in if pain persisted/intensified. Today c/o back pain/left lower quadrant pain/tightening and intermittent contractions that require concentration. Denies bleeding/leaking of fluid. Endorses good fetal movement.

## 2020-05-28 LAB — GC/CHLAMYDIA PROBE AMP
Chlamydia trachomatis, NAA: NEGATIVE
Neisseria Gonorrhoeae by PCR: NEGATIVE

## 2020-05-29 ENCOUNTER — Other Ambulatory Visit: Payer: Self-pay

## 2020-05-29 ENCOUNTER — Ambulatory Visit (INDEPENDENT_AMBULATORY_CARE_PROVIDER_SITE_OTHER): Admitting: Certified Nurse Midwife

## 2020-05-29 ENCOUNTER — Ambulatory Visit (INDEPENDENT_AMBULATORY_CARE_PROVIDER_SITE_OTHER)

## 2020-05-29 ENCOUNTER — Encounter: Payer: Self-pay | Admitting: Certified Nurse Midwife

## 2020-05-29 DIAGNOSIS — Z3A36 36 weeks gestation of pregnancy: Secondary | ICD-10-CM

## 2020-05-29 DIAGNOSIS — Z3A34 34 weeks gestation of pregnancy: Secondary | ICD-10-CM

## 2020-05-29 DIAGNOSIS — Z3483 Encounter for supervision of other normal pregnancy, third trimester: Secondary | ICD-10-CM | POA: Diagnosis not present

## 2020-05-29 LAB — POCT URINALYSIS DIPSTICK OB
Bilirubin, UA: NEGATIVE
Blood, UA: NEGATIVE
Glucose, UA: NEGATIVE
Ketones, UA: NEGATIVE
Leukocytes, UA: NEGATIVE
Nitrite, UA: NEGATIVE
POC,PROTEIN,UA: NEGATIVE
Spec Grav, UA: 1.025 (ref 1.010–1.025)
Urobilinogen, UA: 0.2 E.U./dL
pH, UA: 6 (ref 5.0–8.0)

## 2020-05-29 NOTE — Progress Notes (Signed)
ROB doing well. Feels good movement. , pt continues to have round ligament pain and musculoskeletal discomforts of pregnancy. Reassurance given. Self help measures reviewed. SVE per pt request 3/70/-2 station labor precautions reviewed. Follow up 1 wk.   Doreene Burke, CNM  Patient Name: Christina Ruiz DOB: Jan 14, 1991 MRN: [id number] ULTRASOUND REPORT  Location: [facility] Date of Service: 05/29/2020   Indications:growth/afi   Findings:  Mason Jim intrauterine pregnancy is visualized with FHR at 136 BPM.   Fetal presentation is Cephalic.  Placenta: posterior. Grade: 2 AFI: 11.3 cm  Growth percentile is 17. EFW: 2366 g( 5 lbs 3 oz)   Impression: 1. [redacted]w[redacted]d Viable Singleton Intrauterine pregnancy previously established criteria. 2. Growth is 17 %ile.  AFI is 11.3 cm.   Recommendations: 1.Clinical correlation with the patient's History and Physical Exam.   Jenine  M. Marciano Sequin     RDMS

## 2020-05-29 NOTE — Patient Instructions (Signed)
Braxton Hicks Contractions °Contractions of the uterus can occur throughout pregnancy, but they are not always a sign that you are in labor. You may have practice contractions called Braxton Hicks contractions. These false labor contractions are sometimes confused with true labor. °What are Braxton Hicks contractions? °Braxton Hicks contractions are tightening movements that occur in the muscles of the uterus before labor. Unlike true labor contractions, these contractions do not result in opening (dilation) and thinning of the cervix. Toward the end of pregnancy (32-34 weeks), Braxton Hicks contractions can happen more often and may become stronger. These contractions are sometimes difficult to tell apart from true labor because they can be very uncomfortable. You should not feel embarrassed if you go to the hospital with false labor. °Sometimes, the only way to tell if you are in true labor is for your health care provider to look for changes in the cervix. The health care provider will do a physical exam and may monitor your contractions. If you are not in true labor, the exam should show that your cervix is not dilating and your water has not broken. °If there are no other health problems associated with your pregnancy, it is completely safe for you to be sent home with false labor. You may continue to have Braxton Hicks contractions until you go into true labor. °How to tell the difference between true labor and false labor °True labor °· Contractions last 30-70 seconds. °· Contractions become very regular. °· Discomfort is usually felt in the top of the uterus, and it spreads to the lower abdomen and low back. °· Contractions do not go away with walking. °· Contractions usually become more intense and increase in frequency. °· The cervix dilates and gets thinner. °False labor °· Contractions are usually shorter and not as strong as true labor contractions. °· Contractions are usually irregular. °· Contractions  are often felt in the front of the lower abdomen and in the groin. °· Contractions may go away when you walk around or change positions while lying down. °· Contractions get weaker and are shorter-lasting as time goes on. °· The cervix usually does not dilate or become thin. °Follow these instructions at home: ° °· Take over-the-counter and prescription medicines only as told by your health care provider. °· Keep up with your usual exercises and follow other instructions from your health care provider. °· Eat and drink lightly if you think you are going into labor. °· If Braxton Hicks contractions are making you uncomfortable: °? Change your position from lying down or resting to walking, or change from walking to resting. °? Sit and rest in a tub of warm water. °? Drink enough fluid to keep your urine pale yellow. Dehydration may cause these contractions. °? Do slow and deep breathing several times an hour. °· Keep all follow-up prenatal visits as told by your health care provider. This is important. °Contact a health care provider if: °· You have a fever. °· You have continuous pain in your abdomen. °Get help right away if: °· Your contractions become stronger, more regular, and closer together. °· You have fluid leaking or gushing from your vagina. °· You pass blood-tinged mucus (bloody show). °· You have bleeding from your vagina. °· You have low back pain that you never had before. °· You feel your baby’s head pushing down and causing pelvic pressure. °· Your baby is not moving inside you as much as it used to. °Summary °· Contractions that occur before labor are   called Braxton Hicks contractions, false labor, or practice contractions. °· Braxton Hicks contractions are usually shorter, weaker, farther apart, and less regular than true labor contractions. True labor contractions usually become progressively stronger and regular, and they become more frequent. °· Manage discomfort from Braxton Hicks contractions  by changing position, resting in a warm bath, drinking plenty of water, or practicing deep breathing. °This information is not intended to replace advice given to you by your health care provider. Make sure you discuss any questions you have with your health care provider. °Document Revised: 08/21/2017 Document Reviewed: 01/22/2017 °Elsevier Patient Education © 2020 [employer] ° °

## 2020-05-31 ENCOUNTER — Other Ambulatory Visit: Payer: Self-pay

## 2020-05-31 ENCOUNTER — Encounter: Payer: Self-pay | Admitting: Certified Nurse Midwife

## 2020-05-31 ENCOUNTER — Ambulatory Visit (INDEPENDENT_AMBULATORY_CARE_PROVIDER_SITE_OTHER): Admitting: Certified Nurse Midwife

## 2020-05-31 VITALS — BP 98/72 | HR 85 | Wt 134.8 lb

## 2020-05-31 DIAGNOSIS — Z3A36 36 weeks gestation of pregnancy: Secondary | ICD-10-CM

## 2020-05-31 DIAGNOSIS — Z3483 Encounter for supervision of other normal pregnancy, third trimester: Secondary | ICD-10-CM | POA: Diagnosis not present

## 2020-05-31 LAB — POCT URINALYSIS DIPSTICK OB
Bilirubin, UA: NEGATIVE
Blood, UA: NEGATIVE
Glucose, UA: NEGATIVE
Ketones, UA: NEGATIVE
Leukocytes, UA: NEGATIVE
Nitrite, UA: NEGATIVE
Spec Grav, UA: 1.015 (ref 1.010–1.025)
Urobilinogen, UA: 0.2 E.U./dL
pH, UA: 7.5 (ref 5.0–8.0)

## 2020-05-31 NOTE — Progress Notes (Signed)
PT presents for problem visit, complains of loss of mucus plug , increased pelvic pian and pressure, and irregular contractions. She denies odor , itching or burning. Feels good fetal movement. SVE per pt request no significant change in exam 3-4 cm/70/-2. Labor precautions reviewed. Reassurance given. Follow up as scheduled next week.   Doreene Burke, CNM

## 2020-05-31 NOTE — Patient Instructions (Signed)
Braxton Hicks Contractions °Contractions of the uterus can occur throughout pregnancy, but they are not always a sign that you are in labor. You may have practice contractions called Braxton Hicks contractions. These false labor contractions are sometimes confused with true labor. °What are Braxton Hicks contractions? °Braxton Hicks contractions are tightening movements that occur in the muscles of the uterus before labor. Unlike true labor contractions, these contractions do not result in opening (dilation) and thinning of the cervix. Toward the end of pregnancy (32-34 weeks), Braxton Hicks contractions can happen more often and may become stronger. These contractions are sometimes difficult to tell apart from true labor because they can be very uncomfortable. You should not feel embarrassed if you go to the hospital with false labor. °Sometimes, the only way to tell if you are in true labor is for your health care provider to look for changes in the cervix. The health care provider will do a physical exam and may monitor your contractions. If you are not in true labor, the exam should show that your cervix is not dilating and your water has not broken. °If there are no other health problems associated with your pregnancy, it is completely safe for you to be sent home with false labor. You may continue to have Braxton Hicks contractions until you go into true labor. °How to tell the difference between true labor and false labor °True labor °· Contractions last 30-70 seconds. °· Contractions become very regular. °· Discomfort is usually felt in the top of the uterus, and it spreads to the lower abdomen and low back. °· Contractions do not go away with walking. °· Contractions usually become more intense and increase in frequency. °· The cervix dilates and gets thinner. °False labor °· Contractions are usually shorter and not as strong as true labor contractions. °· Contractions are usually irregular. °· Contractions  are often felt in the front of the lower abdomen and in the groin. °· Contractions may go away when you walk around or change positions while lying down. °· Contractions get weaker and are shorter-lasting as time goes on. °· The cervix usually does not dilate or become thin. °Follow these instructions at home: ° °· Take over-the-counter and prescription medicines only as told by your health care provider. °· Keep up with your usual exercises and follow other instructions from your health care provider. °· Eat and drink lightly if you think you are going into labor. °· If Braxton Hicks contractions are making you uncomfortable: °? Change your position from lying down or resting to walking, or change from walking to resting. °? Sit and rest in a tub of warm water. °? Drink enough fluid to keep your urine pale yellow. Dehydration may cause these contractions. °? Do slow and deep breathing several times an hour. °· Keep all follow-up prenatal visits as told by your health care provider. This is important. °Contact a health care provider if: °· You have a fever. °· You have continuous pain in your abdomen. °Get help right away if: °· Your contractions become stronger, more regular, and closer together. °· You have fluid leaking or gushing from your vagina. °· You pass blood-tinged mucus (bloody show). °· You have bleeding from your vagina. °· You have low back pain that you never had before. °· You feel your baby’s head pushing down and causing pelvic pressure. °· Your baby is not moving inside you as much as it used to. °Summary °· Contractions that occur before labor are   called Braxton Hicks contractions, false labor, or practice contractions. °· Braxton Hicks contractions are usually shorter, weaker, farther apart, and less regular than true labor contractions. True labor contractions usually become progressively stronger and regular, and they become more frequent. °· Manage discomfort from Braxton Hicks contractions  by changing position, resting in a warm bath, drinking plenty of water, or practicing deep breathing. °This information is not intended to replace advice given to you by your health care provider. Make sure you discuss any questions you have with your health care provider. °Document Revised: 08/21/2017 Document Reviewed: 01/22/2017 °Elsevier Patient Education © 2020 [employer] ° °

## 2020-06-03 ENCOUNTER — Encounter: Payer: Self-pay | Admitting: Obstetrics and Gynecology

## 2020-06-03 ENCOUNTER — Other Ambulatory Visit: Payer: Self-pay

## 2020-06-03 ENCOUNTER — Observation Stay (HOSPITAL_BASED_OUTPATIENT_CLINIC_OR_DEPARTMENT_OTHER)
Admission: EM | Admit: 2020-06-03 | Discharge: 2020-06-03 | Disposition: A | Discharge status: Home / Self Care | Source: Home / Self Care | Admitting: Certified Nurse Midwife

## 2020-06-03 DIAGNOSIS — Z3A36 36 weeks gestation of pregnancy: Secondary | ICD-10-CM | POA: Insufficient documentation

## 2020-06-03 DIAGNOSIS — Z3A37 37 weeks gestation of pregnancy: Secondary | ICD-10-CM

## 2020-06-03 LAB — WET PREP, GENITAL
Clue Cells Wet Prep HPF POC: NONE SEEN
Sperm: NONE SEEN
Trich, Wet Prep: NONE SEEN
Yeast Wet Prep HPF POC: NONE SEEN

## 2020-06-03 LAB — RUPTURE OF MEMBRANE (ROM)PLUS: Rom Plus: NEGATIVE

## 2020-06-03 MED ORDER — LIDOCAINE HCL (PF) 1 % IJ SOLN: 30.0000 mL | INTRAMUSCULAR | Status: DC | PRN

## 2020-06-03 NOTE — OB Triage Note (Signed)
Discharge instructions reviewed and pt verbalized understanding. Red flag labor symptoms reviewed. VSS and pt discharged with boyfriend. No further needs at this time.

## 2020-06-03 NOTE — OB Triage Note (Signed)
    L&D OB Triage Note  SUBJECTIVE Christina Ruiz is a 29 y.o. G3P1011 female at [redacted]w[redacted]d, EDD Estimated Date of Delivery: 06/25/20 who presented to triage with complaints of leaking of clear fluid, increased pelvic pressure and irregular contractions. She feels good fetal movement and denies vaginal bleeding.   OB History  Gravida Para Term Preterm AB Living  3 1 1  0 1 1  SAB TAB Ectopic Multiple Live Births  1 0 0 0 1    # Outcome Date GA Lbr Len/2nd Weight Sex Delivery Anes PTL Lv  3 Current           2 SAB 2020          1 Term 02/25/18 [redacted]w[redacted]d / 01:08 3572 g M Vag-Spont EPI  LIV     Birth Comments: none     Name: Feeser,BOY Wrenn     Apgar1: 8  Apgar5: 9    Medications Prior to Admission  Medication Sig Dispense Refill Last Dose  . FLUoxetine (PROZAC) 20 MG capsule Take 20 mg by mouth daily.   06/02/2020 at Unknown time  . Prenat-FeFmCb-DSS-FA-DHA w/o A (CITRANATAL HARMONY) 27-1-260 MG CAPS Take 1 tablet by mouth daily. 30 capsule 11 06/03/2020 at Unknown time  . Albuterol Sulfate (PROAIR RESPICLICK) 108 (90 Base) MCG/ACT AEPB Inhale into the lungs.  (Patient not taking: Reported on 06/03/2020)   Not Taking at Unknown time  . butalbital-acetaminophen-caffeine (FIORICET) 50-325-40 MG tablet Take 1-2 tablets by mouth every 6 (six) hours as needed for headache. (Patient not taking: Reported on 05/27/2020) 5 tablet 0   . hydrOXYzine (ATARAX/VISTARIL) 25 MG tablet Take 1 tablet (25 mg total) by mouth every 6 (six) hours as needed (contractions). (Patient not taking: Reported on 05/27/2020) 30 tablet 0   . ondansetron (ZOFRAN-ODT) 4 MG disintegrating tablet Take 1 tablet (4 mg total) by mouth every 8 (eight) hours as needed for nausea or vomiting. (Patient not taking: Reported on 05/24/2020) 20 tablet 0      OBJECTIVE  Nursing Evaluation:   BP 110/67   Pulse 79   Temp 98.8 F (37.1 C) (Oral)   Resp 18   LMP 09/19/2019    Findings:   Intact membranes, irregular contractions      NST was  performed and has been reviewed by me.  NST INTERPRETATION: Category I  Mode: External Baseline Rate (A): 135 bpm Variability: Moderate Accelerations: 15 x 15 Decelerations: None     Contraction Frequency (min): 5-6 min with UI  ASSESSMENT Impression:  1.  Pregnancy:  G3P1011 at [redacted]w[redacted]d , EDD Estimated Date of Delivery: 06/25/20 2.  Reassuring fetal and maternal status 3.  ROM plus negative.  4. Wet prep negative.   PLAN 1. Discussed current condition and above findings with patient and reassurance given.  All questions answered. 2. Discharge home with standard labor precautions given to return to L&D or call the office for problems. 3. Continue routine prenatal care.    08/25/20, CNM

## 2020-06-04 ENCOUNTER — Other Ambulatory Visit: Payer: Self-pay | Admitting: Certified Nurse Midwife

## 2020-06-04 MED ORDER — OXYCODONE-ACETAMINOPHEN 7.5-325 MG PO TABS
1.0000 | ORAL_TABLET | Freq: Three times a day (TID) | ORAL | 0 refills | Status: DC | PRN
Start: 2020-06-04 — End: 2020-06-05

## 2020-06-04 NOTE — Progress Notes (Signed)
Pt complains of severe back and pelvic pain , has tried self help measures. Orders placed for 5 percocet. Pt informed of potential risk vs benefits. She verbalizes and agrees to plan. Orders placed.   Doreene Burke, CNM

## 2020-06-05 ENCOUNTER — Ambulatory Visit (INDEPENDENT_AMBULATORY_CARE_PROVIDER_SITE_OTHER): Admitting: Certified Nurse Midwife

## 2020-06-05 ENCOUNTER — Other Ambulatory Visit: Payer: Self-pay

## 2020-06-05 VITALS — BP 100/67 | HR 88 | Wt 136.2 lb

## 2020-06-05 DIAGNOSIS — Z3A37 37 weeks gestation of pregnancy: Secondary | ICD-10-CM

## 2020-06-05 LAB — POCT URINALYSIS DIPSTICK OB
Bilirubin, UA: NEGATIVE
Blood, UA: NEGATIVE
Glucose, UA: NEGATIVE
Ketones, UA: NEGATIVE
Leukocytes, UA: NEGATIVE
Nitrite, UA: NEGATIVE
POC,PROTEIN,UA: NEGATIVE
Spec Grav, UA: 1.015 (ref 1.010–1.025)
Urobilinogen, UA: 0.2 E.U./dL
pH, UA: 5 (ref 5.0–8.0)

## 2020-06-05 NOTE — Patient Instructions (Signed)
Braxton Hicks Contractions °Contractions of the uterus can occur throughout pregnancy, but they are not always a sign that you are in labor. You may have practice contractions called Braxton Hicks contractions. These false labor contractions are sometimes confused with true labor. °What are Braxton Hicks contractions? °Braxton Hicks contractions are tightening movements that occur in the muscles of the uterus before labor. Unlike true labor contractions, these contractions do not result in opening (dilation) and thinning of the cervix. Toward the end of pregnancy (32-34 weeks), Braxton Hicks contractions can happen more often and may become stronger. These contractions are sometimes difficult to tell apart from true labor because they can be very uncomfortable. You should not feel embarrassed if you go to the hospital with false labor. °Sometimes, the only way to tell if you are in true labor is for your health care provider to look for changes in the cervix. The health care provider will do a physical exam and may monitor your contractions. If you are not in true labor, the exam should show that your cervix is not dilating and your water has not broken. °If there are no other health problems associated with your pregnancy, it is completely safe for you to be sent home with false labor. You may continue to have Braxton Hicks contractions until you go into true labor. °How to tell the difference between true labor and false labor °True labor °· Contractions last 30-70 seconds. °· Contractions become very regular. °· Discomfort is usually felt in the top of the uterus, and it spreads to the lower abdomen and low back. °· Contractions do not go away with walking. °· Contractions usually become more intense and increase in frequency. °· The cervix dilates and gets thinner. °False labor °· Contractions are usually shorter and not as strong as true labor contractions. °· Contractions are usually irregular. °· Contractions  are often felt in the front of the lower abdomen and in the groin. °· Contractions may go away when you walk around or change positions while lying down. °· Contractions get weaker and are shorter-lasting as time goes on. °· The cervix usually does not dilate or become thin. °Follow these instructions at home: ° °· Take over-the-counter and prescription medicines only as told by your health care provider. °· Keep up with your usual exercises and follow other instructions from your health care provider. °· Eat and drink lightly if you think you are going into labor. °· If Braxton Hicks contractions are making you uncomfortable: °? Change your position from lying down or resting to walking, or change from walking to resting. °? Sit and rest in a tub of warm water. °? Drink enough fluid to keep your urine pale yellow. Dehydration may cause these contractions. °? Do slow and deep breathing several times an hour. °· Keep all follow-up prenatal visits as told by your health care provider. This is important. °Contact a health care provider if: °· You have a fever. °· You have continuous pain in your abdomen. °Get help right away if: °· Your contractions become stronger, more regular, and closer together. °· You have fluid leaking or gushing from your vagina. °· You pass blood-tinged mucus (bloody show). °· You have bleeding from your vagina. °· You have low back pain that you never had before. °· You feel your baby’s head pushing down and causing pelvic pressure. °· Your baby is not moving inside you as much as it used to. °Summary °· Contractions that occur before labor are   called Braxton Hicks contractions, false labor, or practice contractions. °· Braxton Hicks contractions are usually shorter, weaker, farther apart, and less regular than true labor contractions. True labor contractions usually become progressively stronger and regular, and they become more frequent. °· Manage discomfort from Braxton Hicks contractions  by changing position, resting in a warm bath, drinking plenty of water, or practicing deep breathing. °This information is not intended to replace advice given to you by your health care provider. Make sure you discuss any questions you have with your health care provider. °Document Revised: 08/21/2017 Document Reviewed: 01/22/2017 °Elsevier Patient Education © 2020 [employer] ° °

## 2020-06-05 NOTE — Progress Notes (Signed)
ROB Doing well. Feels good movement. Continues to have irregular contractions. Labor precautions reviewed. Follow up 1 wk .  Doreene Burke, CNM

## 2020-06-06 ENCOUNTER — Inpatient Hospital Stay: Admitting: Anesthesiology

## 2020-06-06 ENCOUNTER — Ambulatory Visit (INDEPENDENT_AMBULATORY_CARE_PROVIDER_SITE_OTHER): Admitting: Certified Nurse Midwife

## 2020-06-06 ENCOUNTER — Encounter: Payer: Self-pay | Admitting: Certified Nurse Midwife

## 2020-06-06 ENCOUNTER — Inpatient Hospital Stay
Admission: EM | Admit: 2020-06-06 | Discharge: 2020-06-08 | DRG: 807 | Disposition: A | Discharge status: Home / Self Care | Attending: Certified Nurse Midwife | Admitting: Certified Nurse Midwife

## 2020-06-06 VITALS — BP 108/70 | HR 73 | Wt 137.0 lb

## 2020-06-06 DIAGNOSIS — Z20822 Contact with and (suspected) exposure to covid-19: Secondary | ICD-10-CM | POA: Diagnosis present

## 2020-06-06 DIAGNOSIS — O9902 Anemia complicating childbirth: Secondary | ICD-10-CM | POA: Diagnosis present

## 2020-06-06 DIAGNOSIS — O4202 Full-term premature rupture of membranes, onset of labor within 24 hours of rupture: Secondary | ICD-10-CM

## 2020-06-06 DIAGNOSIS — D509 Iron deficiency anemia, unspecified: Secondary | ICD-10-CM | POA: Diagnosis present

## 2020-06-06 DIAGNOSIS — Z3A37 37 weeks gestation of pregnancy: Secondary | ICD-10-CM | POA: Diagnosis not present

## 2020-06-06 DIAGNOSIS — O4292 Full-term premature rupture of membranes, unspecified as to length of time between rupture and onset of labor: Secondary | ICD-10-CM | POA: Diagnosis present

## 2020-06-06 LAB — POCT URINALYSIS DIPSTICK OB
Bilirubin, UA: NEGATIVE
Blood, UA: NEGATIVE
Glucose, UA: NEGATIVE
Ketones, UA: NEGATIVE
Leukocytes, UA: NEGATIVE
Nitrite, UA: NEGATIVE
POC,PROTEIN,UA: NEGATIVE
Spec Grav, UA: 1.01 (ref 1.010–1.025)
Urobilinogen, UA: 0.2 E.U./dL
pH, UA: 5 (ref 5.0–8.0)

## 2020-06-06 LAB — CBC
HCT: 22.6 % — ABNORMAL LOW (ref 36.0–46.0)
Hemoglobin: 7.5 g/dL — ABNORMAL LOW (ref 12.0–15.0)
MCH: 29.1 pg (ref 26.0–34.0)
MCHC: 33.2 g/dL (ref 30.0–36.0)
MCV: 87.6 fL (ref 80.0–100.0)
Platelets: 403 10*3/uL — ABNORMAL HIGH (ref 150–400)
RBC: 2.58 MIL/uL — ABNORMAL LOW (ref 3.87–5.11)
RDW: 12.3 % (ref 11.5–15.5)
WBC: 11.9 10*3/uL — ABNORMAL HIGH (ref 4.0–10.5)
nRBC: 0 % (ref 0.0–0.2)

## 2020-06-06 LAB — TYPE AND SCREEN
ABO/RH(D): A POS
Antibody Screen: NEGATIVE

## 2020-06-06 LAB — SARS CORONAVIRUS 2 BY RT PCR (HOSPITAL ORDER, PERFORMED IN ~~LOC~~ HOSPITAL LAB): SARS Coronavirus 2: NEGATIVE

## 2020-06-06 MED ORDER — LACTATED RINGERS IV SOLN: INTRAVENOUS | Status: DC

## 2020-06-06 MED ORDER — OXYTOCIN-SODIUM CHLORIDE 30-0.9 UT/500ML-% IV SOLN
2.5000 [IU]/h | INTRAVENOUS | Status: DC
  Filled 2020-06-06: qty 500

## 2020-06-06 MED ORDER — PHENYLEPHRINE 40 MCG/ML (10ML) SYRINGE FOR IV PUSH (FOR BLOOD PRESSURE SUPPORT)
80.0000 ug | PREFILLED_SYRINGE | INTRAVENOUS | Status: DC | PRN
  Filled 2020-06-06: qty 10

## 2020-06-06 MED ORDER — OXYTOCIN 10 UNIT/ML IJ SOLN
INTRAMUSCULAR | Status: AC
  Filled 2020-06-06: qty 2

## 2020-06-06 MED ORDER — TERBUTALINE SULFATE 1 MG/ML IJ SOLN: 0.2500 mg | Freq: Once | INTRAMUSCULAR | Status: DC | PRN

## 2020-06-06 MED ORDER — METHYLERGONOVINE MALEATE 0.2 MG/ML IJ SOLN: 0.2000 mg | INTRAMUSCULAR | Status: DC | PRN

## 2020-06-06 MED ORDER — COCONUT OIL OIL
1.0000 "application " | TOPICAL_OIL | Status: DC | PRN
  Administered 2020-06-07: 1 via TOPICAL
  Filled 2020-06-06: qty 120

## 2020-06-06 MED ORDER — BENZOCAINE-MENTHOL 20-0.5 % EX AERO: 1.0000 "application " | INHALATION_SPRAY | CUTANEOUS | Status: DC | PRN

## 2020-06-06 MED ORDER — DIPHENHYDRAMINE HCL 50 MG/ML IJ SOLN: 12.5000 mg | INTRAMUSCULAR | Status: DC | PRN

## 2020-06-06 MED ORDER — FERROUS SULFATE 325 (65 FE) MG PO TABS
325.0000 mg | ORAL_TABLET | Freq: Every day | ORAL | Status: DC
  Administered 2020-06-07 – 2020-06-08 (×2): 325 mg via ORAL
  Filled 2020-06-06 (×2): qty 1

## 2020-06-06 MED ORDER — AMMONIA AROMATIC IN INHA
RESPIRATORY_TRACT | Status: AC
  Filled 2020-06-06: qty 10

## 2020-06-06 MED ORDER — BUTORPHANOL TARTRATE 1 MG/ML IJ SOLN: 1.0000 mg | INTRAMUSCULAR | Status: DC | PRN

## 2020-06-06 MED ORDER — OXYCODONE-ACETAMINOPHEN 5-325 MG PO TABS: 2.0000 | ORAL_TABLET | ORAL | Status: DC | PRN

## 2020-06-06 MED ORDER — MISOPROSTOL 200 MCG PO TABS
ORAL_TABLET | ORAL | Status: AC
  Filled 2020-06-06: qty 4

## 2020-06-06 MED ORDER — SENNOSIDES-DOCUSATE SODIUM 8.6-50 MG PO TABS
2.0000 | ORAL_TABLET | ORAL | Status: DC
  Administered 2020-06-07 – 2020-06-08 (×2): 2 via ORAL
  Filled 2020-06-06 (×2): qty 2

## 2020-06-06 MED ORDER — FENTANYL 2.5 MCG/ML W/ROPIVACAINE 0.15% IN NS 100 ML EPIDURAL (ARMC)
12.0000 mL/h | EPIDURAL | Status: DC
  Administered 2020-06-06: 12 mL/h via EPIDURAL

## 2020-06-06 MED ORDER — LIDOCAINE HCL (PF) 1 % IJ SOLN
30.0000 mL | INTRAMUSCULAR | Status: DC | PRN
  Filled 2020-06-06: qty 30

## 2020-06-06 MED ORDER — LIDOCAINE HCL (PF) 1 % IJ SOLN
INTRAMUSCULAR | Status: DC | PRN
  Administered 2020-06-06: 3 mL

## 2020-06-06 MED ORDER — DOCUSATE SODIUM 100 MG PO CAPS
100.0000 mg | ORAL_CAPSULE | Freq: Two times a day (BID) | ORAL | Status: DC
  Administered 2020-06-06 – 2020-06-08 (×4): 100 mg via ORAL
  Filled 2020-06-06 (×4): qty 1

## 2020-06-06 MED ORDER — BUPIVACAINE HCL (PF) 0.25 % IJ SOLN
INTRAMUSCULAR | Status: DC | PRN
  Administered 2020-06-06 (×2): 4 mL via EPIDURAL

## 2020-06-06 MED ORDER — OXYCODONE-ACETAMINOPHEN 5-325 MG PO TABS
1.0000 | ORAL_TABLET | ORAL | Status: DC | PRN
  Administered 2020-06-08: 1 via ORAL
  Filled 2020-06-06: qty 1

## 2020-06-06 MED ORDER — SIMETHICONE 80 MG PO CHEW: 80.0000 mg | CHEWABLE_TABLET | ORAL | Status: DC | PRN

## 2020-06-06 MED ORDER — ONDANSETRON HCL 4 MG/2ML IJ SOLN: 4.0000 mg | INTRAMUSCULAR | Status: DC | PRN

## 2020-06-06 MED ORDER — ACETAMINOPHEN 325 MG PO TABS: 650.0000 mg | ORAL_TABLET | ORAL | Status: DC | PRN

## 2020-06-06 MED ORDER — OXYTOCIN BOLUS FROM INFUSION
333.0000 mL | Freq: Once | INTRAVENOUS | Status: AC
  Administered 2020-06-06: 333 mL via INTRAVENOUS

## 2020-06-06 MED ORDER — EPHEDRINE 5 MG/ML INJ
10.0000 mg | INTRAVENOUS | Status: DC | PRN
  Filled 2020-06-06: qty 2

## 2020-06-06 MED ORDER — ONDANSETRON HCL 4 MG PO TABS
4.0000 mg | ORAL_TABLET | ORAL | Status: DC | PRN
  Filled 2020-06-06: qty 1

## 2020-06-06 MED ORDER — LIDOCAINE-EPINEPHRINE (PF) 1.5 %-1:200000 IJ SOLN
INTRAMUSCULAR | Status: DC | PRN
  Administered 2020-06-06: 3 mL via PERINEURAL

## 2020-06-06 MED ORDER — DIBUCAINE (PERIANAL) 1 % EX OINT: 1.0000 "application " | TOPICAL_OINTMENT | CUTANEOUS | Status: DC | PRN

## 2020-06-06 MED ORDER — OXYTOCIN-SODIUM CHLORIDE 30-0.9 UT/500ML-% IV SOLN
1.0000 m[IU]/min | INTRAVENOUS | Status: DC
  Administered 2020-06-06: 4 m[IU]/min via INTRAVENOUS
  Filled 2020-06-06: qty 500

## 2020-06-06 MED ORDER — PRENATAL MULTIVITAMIN CH
1.0000 | ORAL_TABLET | Freq: Every day | ORAL | Status: DC
  Administered 2020-06-06 – 2020-06-07 (×2): 1 via ORAL
  Filled 2020-06-06 (×2): qty 1

## 2020-06-06 MED ORDER — ACETAMINOPHEN 325 MG PO TABS
650.0000 mg | ORAL_TABLET | ORAL | Status: DC | PRN
  Administered 2020-06-07 (×3): 650 mg via ORAL
  Filled 2020-06-06 (×4): qty 2

## 2020-06-06 MED ORDER — WITCH HAZEL-GLYCERIN EX PADS: 1.0000 "application " | MEDICATED_PAD | CUTANEOUS | Status: DC | PRN

## 2020-06-06 MED ORDER — LACTATED RINGERS IV SOLN: 500.0000 mL | Freq: Once | INTRAVENOUS | Status: DC

## 2020-06-06 MED ORDER — LACTATED RINGERS IV SOLN: 500.0000 mL | INTRAVENOUS | Status: DC | PRN

## 2020-06-06 MED ORDER — ONDANSETRON HCL 4 MG/2ML IJ SOLN: 4.0000 mg | Freq: Four times a day (QID) | INTRAMUSCULAR | Status: DC | PRN

## 2020-06-06 MED ORDER — METHYLERGONOVINE MALEATE 0.2 MG PO TABS
0.2000 mg | ORAL_TABLET | ORAL | Status: DC | PRN
  Filled 2020-06-06: qty 1

## 2020-06-06 MED ORDER — IBUPROFEN 600 MG PO TABS
600.0000 mg | ORAL_TABLET | Freq: Four times a day (QID) | ORAL | Status: DC
  Administered 2020-06-06 – 2020-06-08 (×7): 600 mg via ORAL
  Filled 2020-06-06 (×7): qty 1

## 2020-06-06 MED ORDER — FENTANYL 2.5 MCG/ML W/ROPIVACAINE 0.15% IN NS 100 ML EPIDURAL (ARMC)
EPIDURAL | Status: AC
  Filled 2020-06-06: qty 100

## 2020-06-06 MED ORDER — SOD CITRATE-CITRIC ACID 500-334 MG/5ML PO SOLN: 30.0000 mL | ORAL | Status: DC | PRN

## 2020-06-06 NOTE — Progress Notes (Signed)
PT presents today for problem visit . She complains of rupture states that she got up this morning around 0630 had a gush of clear fluid and that she has continues to get wet. She feels good movement, is having irregular contractions. SVE 4/70/-2 , clear fluid visualized, nitrazine positive. Pt instructed to go to L&D for admission   Doreene Burke, CNM

## 2020-06-06 NOTE — Progress Notes (Signed)
Patient is here for a labor check

## 2020-06-06 NOTE — Lactation Note (Signed)
This note was copied from a baby's chart. Lactation Consultation Note  Patient Name: Christina Ruiz HQION'G Date: 06/06/2020 Reason for consult: Initial assessment;Early term 12-38.6wks  Lactation introduction. This is mom's second baby, born at 59w2 SVD 3hrs ago. Mom BF first child for 10 months and feels that this baby's first feeding felt "normal". Baby was at the breast for 45 minutes, both breast combined per mom. Mom reports remembering hand expression, and early feeding cues. LC provided brief BF basics education: reminder of cues, feeding with cues, output expectations, and newborn feeding patterns. Lactation name/number updated on whiteboard.  Maternal Data Formula Feeding for Exclusion: No Has patient been taught Hand Expression?: Yes (per mom) Does the patient have breastfeeding experience prior to this delivery?: Yes  Feeding Feeding Type: Breast Fed  LATCH Score                   Interventions Interventions: Breast feeding basics reviewed  Lactation Tools Discussed/Used     Consult Status Consult Status: Follow-up Date: 06/06/20 Follow-up type: Call as needed    Christina Ruiz 06/06/2020, 2:49 PM

## 2020-06-06 NOTE — Anesthesia Preprocedure Evaluation (Signed)
Anesthesia Evaluation  Patient identified by MRN, date of birth, ID band Patient awake    Reviewed: Allergy & Precautions, H&P , NPO status , Patient's Chart, lab work & pertinent test results  History of Anesthesia Complications Negative for: history of anesthetic complications  Airway Mallampati: II  TM Distance: >3 FB Neck ROM: full    Dental no notable dental hx.    Pulmonary neg pulmonary ROS,    Pulmonary exam normal        Cardiovascular negative cardio ROS Normal cardiovascular exam     Neuro/Psych Depression negative neurological ROS     GI/Hepatic negative GI ROS, Neg liver ROS,   Endo/Other  negative endocrine ROS  Renal/GU negative Renal ROS  negative genitourinary   Musculoskeletal   Abdominal   Peds  Hematology  (+) Blood dyscrasia, anemia ,   Anesthesia Other Findings   Reproductive/Obstetrics (+) Pregnancy                             Anesthesia Physical Anesthesia Plan  ASA: II  Anesthesia Plan: Epidural   Post-op Pain Management:    Induction:   PONV Risk Score and Plan:   Airway Management Planned:   Additional Equipment:   Intra-op Plan:   Post-operative Plan:   Informed Consent: I have reviewed the patients History and Physical, chart, labs and discussed the procedure including the risks, benefits and alternatives for the proposed anesthesia with the patient or authorized representative who has indicated his/her understanding and acceptance.       Plan Discussed with: Anesthesiologist  Anesthesia Plan Comments:         Anesthesia Quick Evaluation

## 2020-06-06 NOTE — Addendum Note (Signed)
Addended by: Brooke Dare on: 06/06/2020 11:04 AM   Modules accepted: Orders
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
## 2020-06-06 NOTE — Lactation Note (Signed)
This note was copied from a baby's chart. Lactation Consultation Note  Patient Name: Christina Ruiz WCBJS'E Date: 06/06/2020 Reason for consult: Follow-up assessment;Mother's request;Early term 32-38.6wks  Lactation follow-up per mom's request. Breastfeeding attempt, 4hrs since last feeding following delivery. LC pointed out wet/stool diaper; dad changed.  Baby brought to breast in cradle hold, mom did hand expression to encourage latch/feed, baby did grasp breast easily but fell asleep quickly. LC encouraged skin to skin with baby to help with waking/encouragement of feed. Encouraged continued frequent attempts, hand expression of colostrum between feeds as needed for extra stimulation. Discussed typical newborn feeding behaviors in 37wk infants.   Maternal Data Formula Feeding for Exclusion: No Has patient been taught Hand Expression?: Yes Does the patient have breastfeeding experience prior to this delivery?: Yes  Feeding Feeding Type: Breast Fed  LATCH Score Latch: Grasps breast easily, tongue down, lips flanged, rhythmical sucking.  Audible Swallowing: None (fell asleep)  Type of Nipple: Everted at rest and after stimulation  Comfort (Breast/Nipple): Soft / non-tender  Hold (Positioning): No assistance needed to correctly position infant at breast.  LATCH Score: 8  Interventions Interventions: Breast feeding basics reviewed;Adjust position;Support pillows;Hand express  Lactation Tools Discussed/Used     Consult Status Consult Status: PRN Date: 06/06/20 Follow-up type: Call as needed    Danford Bad 06/06/2020, 4:33 PM

## 2020-06-06 NOTE — Anesthesia Procedure Notes (Signed)
Epidural Patient location during procedure: OB Start time: 06/06/2020 10:53 AM End time: 06/06/2020 10:55 AM  Staffing Anesthesiologist: Piscitello, Cleda Mccreedy, MD Resident/CRNA: Stormy Fabian, CRNA Performed: resident/CRNA   Preanesthetic Checklist Completed: patient identified, IV checked, site marked, risks and benefits discussed, surgical consent, monitors and equipment checked, pre-op evaluation and timeout performed  Epidural Patient position: sitting Prep: ChloraPrep Patient monitoring: heart rate, continuous pulse ox and blood pressure Approach: midline Location: L3-L4 Injection technique: LOR saline  Needle:  Needle type: Tuohy  Needle gauge: 17 G Needle length: 9 cm and 9 Needle insertion depth: 5 cm Catheter type: closed end flexible Catheter size: 19 Gauge Catheter at skin depth: 11 cm Test dose: negative and 1.5% lidocaine with Epi 1:200 K  Assessment Sensory level: T10 Events: blood not aspirated, injection not painful, no injection resistance, no paresthesia and negative IV test  Additional Notes 1 attempt Pt. Evaluated and documentation done after procedure finished. Patient identified. Risks/Benefits/Options discussed with patient including but not limited to bleeding, infection, nerve damage, paralysis, failed block, incomplete pain control, headache, blood pressure changes, nausea, vomiting, reactions to medication both or allergic, itching and postpartum back pain. Confirmed with bedside nurse the patient's most recent platelet count. Confirmed with patient that they are not currently taking any anticoagulation, have any bleeding history or any family history of bleeding disorders. Patient expressed understanding and wished to proceed. All questions were answered. Sterile technique was used throughout the entire procedure. Please see nursing notes for vital signs. Test dose was given through epidural catheter and negative prior to continuing to dose epidural or  start infusion. Warning signs of high block given to the patient including shortness of breath, tingling/numbness in hands, complete motor block, or any concerning symptoms with instructions to call for help. Patient was given instructions on fall risk and not to get out of bed. All questions and concerns addressed with instructions to call with any issues or inadequate analgesia.   Patient tolerated the insertion well without immediate complications.Reason for block:procedure for pain

## 2020-06-07 LAB — CBC
HCT: 30.2 % — ABNORMAL LOW (ref 36.0–46.0)
Hemoglobin: 9.9 g/dL — ABNORMAL LOW (ref 12.0–15.0)
MCH: 29.1 pg (ref 26.0–34.0)
MCHC: 32.8 g/dL (ref 30.0–36.0)
MCV: 88.8 fL (ref 80.0–100.0)
Platelets: 269 10*3/uL (ref 150–400)
RBC: 3.4 MIL/uL — ABNORMAL LOW (ref 3.87–5.11)
RDW: 12.5 % (ref 11.5–15.5)
WBC: 8.1 10*3/uL (ref 4.0–10.5)
nRBC: 0 % (ref 0.0–0.2)

## 2020-06-07 LAB — RAPID HIV SCREEN (HIV 1/2 AB+AG)
HIV 1/2 Antibodies: NONREACTIVE
HIV-1 P24 Antigen - HIV24: NONREACTIVE

## 2020-06-07 LAB — RPR: RPR Ser Ql: NONREACTIVE

## 2020-06-07 NOTE — Progress Notes (Signed)
Progress Note - Vaginal Delivery  Christina Ruiz is a 29 y.o. G3P2012 now PP day 1 s/p Vaginal, Spontaneous .   Subjective:  The patient reports no complaints, up ad lib, voiding, tolerating PO and + flatus Discharge tomorrow due baby not being discharged.   Objective:  Vital signs in last 24 hours: Temp:  [97.6 F (36.4 C)-98.7 F (37.1 C)] 98.5 F (36.9 C) (09/16 0733) Pulse Rate:  [57-94] 62 (09/16 0733) Resp:  [18-20] 18 (09/16 0733) BP: (108-134)/(68-88) 131/87 (09/16 0733) SpO2:  [96 %-100 %] 97 % (09/16 0733)  Physical Exam:  General: alert, cooperative and appears stated age Lochia: appropriate Uterine Fundus: firm    Data Review Recent Labs    06/06/20 0908 06/07/20 0605  HGB 7.5* 9.9*  HCT 22.6* 30.2*    Assessment/Plan: Active Problems:   Labor and delivery, indication for care   Plan for discharge tomorrow   -- Continue routine PP care.     Doreene Burke, CNM 06/07/2020 10:21 AM

## 2020-06-07 NOTE — Lactation Note (Signed)
This note was copied from a baby's chart. Lactation Consultation Note  Patient Name: Christina Ruiz [id number] Date: 06/07/2020 Reason for consult: Follow-up assessment;Early term 21-38.6wks  Lactation follow-up. Parents report cluster feeding overnight, followed by longer period of sleep, then a long ( ) feed this morning.  LC discussed feeding behaviors of newborns and stomach size, as well as feeding behaviors of babies born at 37 weeks. Encouraged mom to do breast massage and compressions when baby is at the breast to help with removal of colostrum/transfer and satiety.  Reviewed signs of adequate transfer, breastfeeding basics for mom and baby in the days to come, breast and nipple care, and management of breast fullness and engorgement.  Information for outpatient lactation services and community breastfeeding resources given. Encouraged to call out today for support or with questions.  Maternal Data Formula Feeding for Exclusion: No Has patient been taught Hand Expression?: Yes Does the patient have breastfeeding experience prior to this delivery?: Yes  Feeding Feeding Type: Breast Fed  LATCH Score Latch: Repeated attempts needed to sustain latch, nipple held in mouth throughout feeding, stimulation needed to elicit sucking reflex.  Audible Swallowing: Spontaneous and intermittent  Type of Nipple: Everted at rest and after stimulation  Comfort (Breast/Nipple): Soft / non-tender  Hold (Positioning): No assistance needed to correctly position infant at breast.  LATCH Score: 9  Interventions Interventions: Breast feeding basics reviewed;Hand express;Breast massage  Lactation Tools Discussed/Used     Consult Status Consult Status: Complete Follow-up type: Call as needed    Danford Bad 06/07/2020, 12:31 PM

## 2020-06-07 NOTE — Anesthesia Postprocedure Evaluation (Signed)
Anesthesia Post Note  Patient: Christina Ruiz  Procedure(s) Performed: AN AD HOC LABOR EPIDURAL  Patient location during evaluation: Mother Baby Anesthesia Type: Epidural Level of consciousness: awake and alert Pain management: pain level controlled Vital Signs Assessment: post-procedure vital signs reviewed and stable Respiratory status: spontaneous breathing, nonlabored ventilation and respiratory function stable Cardiovascular status: stable Postop Assessment: no headache, no backache and epidural receding Anesthetic complications: no   No complications documented.   Last Vitals:  Vitals:   06/07/20 0339 06/07/20 0733  BP: 111/71 131/87  Pulse: 64 62  Resp: 18 18  Temp: 36.7 C 36.9 C  SpO2: 98% 97%    Last Pain:  Vitals:   06/07/20 0733  TempSrc: Oral  PainSc:                  Rica Mast

## 2020-06-07 NOTE — Final Progress Note (Signed)
Patient Name: Christina Ruiz DOB: 17-Jan-1991 MRN: [id number]                            Discharge Summary  Date of Admission: 06/06/2020 Date of Discharge: 06/08/2020 Delivering Provider: Doreene Burke   Admitting Diagnosis: Labor and delivery, indication for care [O75.9] at [redacted]w[redacted]d Secondary diagnosis:  Active Problems:   Labor and delivery, indication for care   Mode of Delivery: normal spontaneous vaginal delivery              Discharge diagnosis: Term Pregnancy Delivered      Intrapartum Procedures: pitocin augmentation   Post partum procedures: none  Complications: none                     Discharge Day SOAP Note:  Progress Note - Vaginal Delivery  Christina Ruiz is a 29 y.o. G3P2012 now PP day 2 s/p Vaginal, Spontaneous . Delivery was uncomplicated  Subjective  The patient has the following complaints: has no unusual complaints  Pain is controlled with current medications.   Patient is urinating without difficulty.  She is ambulating well.     Objective  Vital signs: BP 103/63   Pulse 65   Temp 98.1 F (36.7 C) (Oral)   Resp 20   Ht 5\' 4"  (1.626 m)   Wt 62.1 kg   LMP 09/19/2019   SpO2 99%   Breastfeeding Unknown   BMI 23.52 kg/m   Physical Exam: Gen: NAD Fundus Fundal Tone: Firm  Lochia Amount: Small        Data Review Labs: Lab Results  Component Value Date   WBC 8.1 06/07/2020   HGB 9.9 (L) 06/07/2020   HCT 30.2 (L) 06/07/2020   MCV 88.8 06/07/2020   PLT 269 06/07/2020   CBC Latest Ref Rng & Units 06/07/2020 06/06/2020 05/27/2020  WBC 4.0 - 10.5 K/uL 8.1 11.9(H) 8.7  Hemoglobin 12.0 - 15.0 g/dL 07/27/2020) 7.5(L) 10.1(L)  Hematocrit 36 - 46 % 30.2(L) 22.6(L) 30.3(L)  Platelets 150 - 400 K/uL 269 403(H) 306   A POS  Edinburgh Score: Edinburgh Postnatal Depression Scale Screening Tool 06/07/2020  I have been able to laugh and see the funny side of things. (No Data)    Assessment/Plan  Active Problems:   Labor and delivery, indication  for care    Plan for discharge today.  Discharge Instructions: Per After Visit Summary. Activity: Advance as tolerated. Pelvic rest for 6 weeks.  Also refer to After Visit Summary Diet: Regular Medications: Allergies as of 06/08/2020   No Known Allergies     Medication List    TAKE these medications   CitraNatal Harmony 27-1-260 MG Caps Take 1 tablet by mouth daily.   ferrous sulfate 325 (65 FE) MG tablet Take 1 tablet (325 mg total) by mouth daily with breakfast.   FLUoxetine 20 MG capsule Commonly known as: PROZAC Take 20 mg by mouth daily.   ibuprofen 600 MG tablet Commonly known as: ADVIL Take 1 tablet (600 mg total) by mouth every 6 (six) hours.      Outpatient follow up: 2 wks tele visit with 08-18-1977 6 wk ppv with Pattricia Boss  Postpartum contraception: Will discuss at first office visit post-partum  Discharged Condition: good  Discharged to: home  Newborn Data: Disposition:home with mother  Apgars: APGAR (1 MIN): 8   APGAR (5 MINS): 9   APGAR (10 MINS):    Baby  Feeding: Breast    Doreene Burke, CNM  06/08/2020 3:25 AM

## 2020-06-08 MED ORDER — IBUPROFEN 600 MG PO TABS: 600.0000 mg | ORAL_TABLET | Freq: Four times a day (QID) | ORAL | 0 refills | Status: DC

## 2020-06-08 MED ORDER — FERROUS SULFATE 325 (65 FE) MG PO TABS
325.0000 mg | ORAL_TABLET | Freq: Every day | ORAL | 3 refills | Status: DC
Start: 2020-06-08 — End: 2021-03-28

## 2020-06-08 NOTE — Clinical Social Work Maternal (Signed)
CLINICAL SOCIAL WORK MATERNAL/CHILD NOTE  Patient Details  Name: Christina Ruiz MRN: [id number] Date of Birth: 01/10/1991  Date:  06/08/2020  Clinical Social Worker Initiating Note:  Shevaun Lovan Date/Time: Initiated:  06/08/20/      Child's Name:  Christina Ruiz   Biological Parents:  Mother, Father   Need for Interpreter:  None   Reason for Referral:  Other (Comment) (Edinburgh Score: 17)   Address:  [address] [place] [address]    Phone number:  [phone] (home)     Additional phone number: None.  Household Members/Support Persons (HM/SP):   Household Member/Support Person 1, Household Member/Support Person 2   HM/SP Name Relationship DOB or Age  HM/SP -1 Aiden son 2 years  HM/SP -2 Zack Cantaffa spouse unknown  HM/SP -3        HM/SP -4        HM/SP -5        HM/SP -6        HM/SP -7        HM/SP -8          Natural Supports (not living in the home):  Extended Family, Friends, Immediate Family   Professional Supports:     Employment: Unemployed   Type of Work:     Education:  College graduate   Homebound arranged:    Financial Resources:  Private Insurance   Other Resources:  WIC   Cultural/Religious Considerations Which May Impact Care:  None.  Strengths:  Ability to meet basic needs , Compliance with medical plan , Pediatrician chosen, Home prepared for child , Understanding of illness   Psychotropic Medications:         Pediatrician:    [place]  Pediatrician List:   [place]    [place]    [place] [facility]  [place]    [place]    [place]      Pediatrician Fax Number:    Risk Factors/Current Problems:  None   Cognitive State:  Alert , Goal Oriented , Able to Concentrate    Mood/Affect:  Calm , Happy    CSW Assessment:  CSW received a consult for Edinburgh Score.  CSW spoke with RN Emily prior to meeting with MOB. Per RN, MOB is appropriate and there  are no additional concerns at this time.   CSW met with MOB at bedside. Explained HIPPA and MOB elected for FOB/spouse to remain at bedside during assessment. Explained CSW's role and reason for referral.  MOB reported she is feeling good post delivery. MOB was alert, appropriate, and attentive to Baby during assessment.   Confirmed contact information for MOB. MOB and Baby will be living with FOB and their son (listed above) at discharge.   MOB reported that she receives WIC and will inform her Workers of Baby's birth. MOB plans to use [facility] (Dr. Flores) for Baby's Medical Care. MOB reported she has a crib, bassinett, car seat (new), clothing, diapers, and all other items needed for Baby. MOB reported she has reliable transportation for herself and Baby. MOB denied resource needs at this time.   MOB reported she has depression and panic attacks in the past. MOB reported she takes Prozac and sees a counselor regularly. MOB reported she has a good support system and is coping well emotionally at this time. MOB denied SI, HI, or DV. MOB denied the need for mental health support resources at this time, reported she is aware of   resources if additional support is needed.  CSW provided education and information sheets on PPD and SIDS. MOB verbalized understanding. CSW ecouraged MOB to reach out to her Provider with any questions or needs for support or resources, even after discharge.   MOB denied any needs or questions at this time. CSW encouraged MOB to reach out if any arise prior to discharge.   Please re consult CSW if any additional needs or concerns arise.  CSW Plan/Description:  Sudden Infant Death Syndrome (SIDS) Education, Perinatal Mood and Anxiety Disorder (PMADs) Education, Other Patient/Family Education, No Further Intervention Required/No Barriers to Discharge    [place], LCSW 06/08/2020, 10:43 AM

## 2020-06-08 NOTE — Progress Notes (Signed)
Patient discharged home with infant. Discharge instructions, prescriptions and follow up appointment given to and reviewed with patient. Patient verbalized understanding. Pt wheeled out with infant by auxiliary.  

## 2020-06-08 NOTE — Discharge Summary (Signed)
Discharge Day SOAP Note:  Progress Note - Vaginal Delivery  Christina Ruiz is a 29 y.o. G3P2012 now PP day 2 s/p Vaginal, Spontaneous . Delivery was uncomplicated  Subjective  The patient has the following complaints: has no unusual complaints  Pain is controlled with current medications.   Patient is urinating without difficulty.  She is ambulating well.     Objective  Vital signs: BP 103/63   Pulse 65   Temp 98.1 F (36.7 C) (Oral)   Resp 20   Ht 5\' 4"  (1.626 m)   Wt 62.1 kg   LMP 09/19/2019   SpO2 99%   Breastfeeding Unknown   BMI 23.52 kg/m   Physical Exam: Gen: NAD Fundus Fundal Tone: Firm  Lochia Amount: Small        Data Review Labs: Lab Results  Component Value Date   WBC 8.1 06/07/2020   HGB 9.9 (L) 06/07/2020   HCT 30.2 (L) 06/07/2020   MCV 88.8 06/07/2020   PLT 269 06/07/2020   CBC Latest Ref Rng & Units 06/07/2020 06/06/2020 05/27/2020  WBC 4.0 - 10.5 K/uL 8.1 11.9(H) 8.7  Hemoglobin 12.0 - 15.0 g/dL 07/27/2020) 7.5(L) 10.1(L)  Hematocrit 36 - 46 % 30.2(L) 22.6(L) 30.3(L)  Platelets 150 - 400 K/uL 269 403(H) 306   A POS  Edinburgh Score: Edinburgh Postnatal Depression Scale Screening Tool 06/07/2020  I have been able to laugh and see the funny side of things. (No Data)    Assessment/Plan  Active Problems:   Labor and delivery, indication for care    Plan for discharge today.  Discharge Instructions: Per After Visit Summary. Activity: Advance as tolerated. Pelvic rest for 6 weeks.  Also refer to After Visit Summary Diet: Regular Medications: Allergies as of 06/08/2020   No Known Allergies     Medication List    TAKE these medications   CitraNatal Harmony 27-1-260 MG Caps Take 1 tablet by mouth daily.   ferrous sulfate 325 (65 FE) MG tablet Take 1 tablet (325 mg total) by mouth daily with breakfast.   FLUoxetine 20 MG capsule Commonly known as: PROZAC Take 20 mg by mouth daily.   ibuprofen 600 MG tablet Commonly known as:  ADVIL Take 1 tablet (600 mg total) by mouth every 6 (six) hours.      Outpatient follow up: 2 wks tele visit with 08-18-1977 6 wk ppv with Pattricia Boss  Postpartum contraception: Will discuss at first office visit post-partum  Discharged Condition: good  Discharged to: home  Newborn Data: Disposition:home with mother  Apgars: APGAR (1 MIN): 8   APGAR (5 MINS): 9   APGAR (10 MINS):    Baby Feeding: Breast    Pattricia Boss, CNM  06/08/2020 3:25 AM

## 2020-06-08 NOTE — Lactation Note (Signed)
This note was copied from a baby's chart. Lactation Consultation Note  Patient Name: Christina Ruiz GURKY'H Date: 06/08/2020 Reason for consult: Follow-up assessment;Early term 37-38.6wks For d/c today  Maternal Data Formula Feeding for Exclusion: No Has patient been taught Hand Expression?: Yes Does the patient have breastfeeding experience prior to this delivery?: Yes Mom had mastitis with first child, has questions about prevention with this child, encouraged to feed baby frequently on both breasts, if breasts firm after feeding, pump breasts to soften, removing milk prevents engorgement and lessens chances of developing mastitis., mom has an electric breast pump at home  Feeding Feeding Type: Breast Fed Swallows noted while baby feeding, encouraged mom to stimulate baby to stay awake while at breast, offer second breast after burping and stimulating to awaken   Early [facility] Score Latch: Grasps breast easily, tongue down, lips flanged, rhythmical sucking.  Audible Swallowing: Spontaneous and intermittent  Type of Nipple: Everted at rest and after stimulation  Comfort (Breast/Nipple): Soft / non-tender  Hold (Positioning): No assistance needed to correctly position infant at breast.  LATCH Score: 10  Interventions  LC name and number written on white board   Lactation Tools Discussed/Used WIC Program: Yes   Consult Status Consult Status: Complete Date: 06/08/20 Follow-up type: In-patient    Dyann Kief 06/08/2020, 12:08 PM

## 2020-06-13 ENCOUNTER — Encounter: Admitting: Certified Nurse Midwife

## 2020-06-20 ENCOUNTER — Encounter: Admitting: Certified Nurse Midwife

## 2020-06-20 ENCOUNTER — Telehealth: Payer: Self-pay

## 2020-06-20 NOTE — Telephone Encounter (Signed)
Attempted to contact patient for scheduled televisit. Voicemail message left to return our call.

## 2020-06-26 ENCOUNTER — Encounter: Admitting: Certified Nurse Midwife

## 2020-07-18 ENCOUNTER — Encounter: Payer: Self-pay | Admitting: Certified Nurse Midwife

## 2020-07-18 ENCOUNTER — Ambulatory Visit (INDEPENDENT_AMBULATORY_CARE_PROVIDER_SITE_OTHER): Admitting: Certified Nurse Midwife

## 2020-07-18 ENCOUNTER — Other Ambulatory Visit: Payer: Self-pay

## 2020-07-18 NOTE — Patient Instructions (Signed)

## 2020-07-18 NOTE — Progress Notes (Signed)
Subjective:    Christina Ruiz is a 29 y.o. G69P2012 Caucasian female who presents for a postpartum visit. She is 6 weeks postpartum following a spontaneous vaginal delivery at 37.2 gestational weeks. Anesthesia: epidural. I have fully reviewed the prenatal and intrapartum course. Postpartum course has been normal. Baby's course has been normal. Baby is feeding by breast. Bleeding no bleeding. Bowel function is normal. Bladder function is normal. Patient is not sexually active.. Contraception method is condoms. Postpartum depression screening: podiyive. Score 19.  Last pap 8/27/2019and was negative.  The following portions of the patient's history were reviewed and updated as appropriate: allergies, current medications, past medical history, past surgical history and problem list.  Review of Systems Pertinent items are noted in HPI.   Vitals:   07/18/20 1111  BP: 110/75  Pulse: 80  Weight: 125 lb 7 oz (56.9 kg)  Height: 5\' 4"  (1.626 m)   No LMP recorded.  Objective:   General:  alert, cooperative and no distress   Breasts:  deferred, no complaints  Lungs: clear to auscultation bilaterally  Heart:  regular rate and rhythm  Abdomen: soft, nontender   Vulva: normal  Vagina: normal vagina  Cervix:  closed  Corpus: Well-involuted  Adnexa:  Non-palpable  Rectal Exam: no hemorrhoids        Assessment:   Postpartum exam 6 wks s/p SVD Breasstfeeding Depression screening Contraception counseling   Plan:  : condoms, Discussed depression pt state she does not want to do anything different with her medications because she is breastfeeding. She has major depressive disorder. She has an appointment with her psychiatrist this week.   Follow up in: 6 months for annual exam or earlier if needed  , CNM

## 2020-08-05 ENCOUNTER — Other Ambulatory Visit: Payer: Self-pay | Admitting: Certified Nurse Midwife

## 2020-08-05 MED ORDER — DICLOXACILLIN SODIUM 500 MG PO CAPS: 500.0000 mg | ORAL_CAPSULE | Freq: Four times a day (QID) | ORAL | 0 refills | Status: AC

## 2020-08-05 NOTE — Progress Notes (Signed)
Pt has mastitis , orders placed for treatment.   Doreene Burke, CNM

## 2020-09-22 NOTE — L&D Delivery Note (Signed)
° ° °     Delivery Note   Abbie Berling is a 30 y.o. [id number] at [redacted]w[redacted]d Estimated Date of Delivery: 09/24/21  PRE-OPERATIVE DIAGNOSIS:  1) [redacted]w[redacted]d pregnancy.    POST-OPERATIVE DIAGNOSIS:  1) [redacted]w[redacted]d pregnancy s/p Vaginal, Spontaneous    Delivery Type: Vaginal, Spontaneous    Delivery Anesthesia: Epidural   Labor Complications:  none    ESTIMATED BLOOD LOSS: 150 ml    FINDINGS:   1) female infant, Apgar scores of    8 at 1 minute and  8  at 5 minutes and a birthweight pending, infant remains skin to skin with mother.    2) Nuchal cord: none  SPECIMENS:   PLACENTA:   Appearance: Intact , 3 vessel cord   Removal: Spontaneous      Disposition:   per protocol   DISPOSITION:  Infant to left in stable condition in the delivery room, with L&D personnel and mother,  NARRATIVE SUMMARY: Labor course:  Ms. Darlena Koval is a [id number] at [redacted]w[redacted]d who presented for labor management.  She progressed well in labor with pitocin.  She received the appropriate anesthesia and proceeded to complete dilation. She evidenced good maternal expulsive effort during the second stage. She went on to deliver a viable female infant "Bently". The placenta delivered without problems and was noted to be complete. A perineal and vaginal examination was performed. Episiotomy/Lacerations: None  The patient tolerated this well.  Doreene Burke, CNM  09/09/2021 6:08 PM

## 2021-01-16 ENCOUNTER — Ambulatory Visit (INDEPENDENT_AMBULATORY_CARE_PROVIDER_SITE_OTHER): Admitting: Certified Nurse Midwife

## 2021-01-16 ENCOUNTER — Other Ambulatory Visit: Payer: Self-pay

## 2021-01-16 ENCOUNTER — Other Ambulatory Visit (HOSPITAL_COMMUNITY)
Admission: RE | Admit: 2021-01-16 | Discharge: 2021-01-16 | Disposition: A | Discharge status: Home / Self Care | Source: Ambulatory Visit | Attending: Certified Nurse Midwife | Admitting: Certified Nurse Midwife

## 2021-01-16 ENCOUNTER — Encounter: Payer: Self-pay | Admitting: Certified Nurse Midwife

## 2021-01-16 VITALS — BP 115/83 | HR 87 | Ht 64.0 in | Wt 137.7 lb

## 2021-01-16 DIAGNOSIS — Z1159 Encounter for screening for other viral diseases: Secondary | ICD-10-CM

## 2021-01-16 DIAGNOSIS — Z01419 Encounter for gynecological examination (general) (routine) without abnormal findings: Secondary | ICD-10-CM | POA: Diagnosis not present

## 2021-01-16 DIAGNOSIS — Z3202 Encounter for pregnancy test, result negative: Secondary | ICD-10-CM

## 2021-01-16 DIAGNOSIS — Z124 Encounter for screening for malignant neoplasm of cervix: Secondary | ICD-10-CM | POA: Insufficient documentation

## 2021-01-16 DIAGNOSIS — Z13220 Encounter for screening for lipoid disorders: Secondary | ICD-10-CM

## 2021-01-16 DIAGNOSIS — Z789 Other specified health status: Secondary | ICD-10-CM

## 2021-01-16 DIAGNOSIS — N912 Amenorrhea, unspecified: Secondary | ICD-10-CM

## 2021-01-16 LAB — POCT URINE PREGNANCY: Preg Test, Ur: NEGATIVE
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
## 2021-01-16 NOTE — Patient Instructions (Signed)
Preventive Care 21-28 Years Old, Female Preventive care refers to lifestyle choices and visits with your health care provider that can promote health and wellness. This includes:  A yearly physical exam. This is also called an annual wellness visit.  Regular dental and eye exams.  Immunizations.  Screening for certain conditions.  Healthy lifestyle choices, such as: ? Eating a healthy diet. ? Getting regular exercise. ? Not using drugs or products that contain nicotine and tobacco. ? Limiting alcohol use. What can I expect for my preventive care visit? Physical exam Your health care provider may check your:  Height and weight. These may be used to calculate your BMI (body mass index). BMI is a measurement that tells if you are at a healthy weight.  Heart rate and blood pressure.  Body temperature.  Skin for abnormal spots. Counseling Your health care provider may ask you questions about your:  Past medical problems.  Family's medical history.  Alcohol, tobacco, and drug use.  Emotional well-being.  Home life and relationship well-being.  Sexual activity.  Diet, exercise, and sleep habits.  Work and work environment.  Access to firearms.  Method of birth control.  Menstrual cycle.  Pregnancy history. What immunizations do I need? Vaccines are usually given at various ages, according to a schedule. Your health care provider will recommend vaccines for you based on your age, medical history, and lifestyle or other factors, such as travel or where you work.   What tests do I need? Blood tests  Lipid and cholesterol levels. These may be checked every 5 years starting at age 20.  Hepatitis C test.  Hepatitis B test. Screening  Diabetes screening. This is done by checking your blood sugar (glucose) after you have not eaten for a while (fasting).  STD (sexually transmitted disease) testing, if you are at risk.  BRCA-related cancer screening. This may be  done if you have a family history of breast, ovarian, tubal, or peritoneal cancers.  Pelvic exam and Pap test. This may be done every 3 years starting at age 21. Starting at age 30, this may be done every 5 years if you have a Pap test in combination with an HPV test. Talk with your health care provider about your test results, treatment options, and if necessary, the need for more tests.   Follow these instructions at home: Eating and drinking  Eat a healthy diet that includes fresh fruits and vegetables, whole grains, lean protein, and low-fat dairy products.  Take vitamin and mineral supplements as recommended by your health care provider.  Do not drink alcohol if: ? Your health care provider tells you not to drink. ? You are pregnant, may be pregnant, or are planning to become pregnant.  If you drink alcohol: ? Limit how much you have to 0-1 drink a day. ? Be aware of how much alcohol is in your drink. In the [place] one drink equals one 12 oz bottle of beer (355 mL), one 5 oz glass of wine (148 mL), or one 1 oz glass of hard liquor (44 mL).   Lifestyle  Take daily care of your teeth and gums. Brush your teeth every morning and night with fluoride toothpaste. Floss one time each day.  Stay active. Exercise for at least 30 minutes 5 or more days each week.  Do not use any products that contain nicotine or tobacco, such as cigarettes, e-cigarettes, and chewing tobacco. If you need help quitting, ask your health care provider.  Do not   use drugs.  If you are sexually active, practice safe sex. Use a condom or other form of protection to prevent STIs (sexually transmitted infections).  If you do not wish to become pregnant, use a form of birth control. If you plan to become pregnant, see your health care provider for a prepregnancy visit.  Find healthy ways to cope with stress, such as: ? Meditation, yoga, or listening to music. ? Journaling. ? Talking to a trusted  person. ? Spending time with friends and family. Safety  Always wear your seat belt while driving or riding in a vehicle.  Do not drive: ? If you have been drinking alcohol. Do not ride with someone who has been drinking. ? When you are tired or distracted. ? While texting.  Wear a helmet and other protective equipment during sports activities.  If you have firearms in your house, make sure you follow all gun safety procedures.  Seek help if you have been physically or sexually abused. What's next?  Go to your health care provider once a year for an annual wellness visit.  Ask your health care provider how often you should have your eyes and teeth checked.  Stay up to date on all vaccines. This information is not intended to replace advice given to you by your health care provider. Make sure you discuss any questions you have with your health care provider. Document Revised: 05/06/2020 Document Reviewed: 05/20/2018 Elsevier Patient Education  2021 Elsevier Inc.  

## 2021-01-17 ENCOUNTER — Other Ambulatory Visit: Payer: Self-pay | Admitting: Certified Nurse Midwife

## 2021-01-17 DIAGNOSIS — N912 Amenorrhea, unspecified: Secondary | ICD-10-CM

## 2021-01-17 LAB — LIPID PANEL
Chol/HDL Ratio: 2.5 ratio (ref 0.0–4.4)
Cholesterol, Total: 163 mg/dL (ref 100–199)
HDL: 64 mg/dL (ref >39)
LDL Chol Calc (NIH): 88 mg/dL (ref 0–99)
Triglycerides: 51 mg/dL (ref 0–149)
VLDL Cholesterol Cal: 11 mg/dL (ref 5–40)

## 2021-01-17 LAB — HEPATITIS C ANTIBODY: Hep C Virus Ab: <0.1 s/co ratio (ref 0.0–0.9)

## 2021-01-17 LAB — BETA HCG QUANT (REF LAB): hCG Quant: 203 m[IU]/mL

## 2021-01-18 LAB — CYTOLOGY - PAP: Diagnosis: NEGATIVE

## 2021-01-28 ENCOUNTER — Other Ambulatory Visit: Payer: Self-pay

## 2021-01-28 DIAGNOSIS — N912 Amenorrhea, unspecified: Secondary | ICD-10-CM

## 2021-01-29 ENCOUNTER — Other Ambulatory Visit: Payer: Self-pay | Admitting: Certified Nurse Midwife

## 2021-01-29 DIAGNOSIS — N912 Amenorrhea, unspecified: Secondary | ICD-10-CM

## 2021-01-29 LAB — BETA HCG QUANT (REF LAB): hCG Quant: 20196 m[IU]/mL

## 2021-02-15 DIAGNOSIS — Z0289 Encounter for other administrative examinations: Secondary | ICD-10-CM

## 2021-02-20 ENCOUNTER — Other Ambulatory Visit: Payer: Self-pay | Admitting: Certified Nurse Midwife

## 2021-02-20 MED ORDER — DOXYLAMINE-PYRIDOXINE 10-10 MG PO TBEC: 1.0000 | DELAYED_RELEASE_TABLET | Freq: Four times a day (QID) | ORAL | 5 refills | Status: DC

## 2021-02-21 ENCOUNTER — Other Ambulatory Visit: Payer: Self-pay

## 2021-02-21 ENCOUNTER — Ambulatory Visit
Admission: RE | Admit: 2021-02-21 | Discharge: 2021-02-21 | Disposition: A | Discharge status: Home / Self Care | Source: Ambulatory Visit | Attending: Certified Nurse Midwife | Admitting: Certified Nurse Midwife

## 2021-02-21 ENCOUNTER — Other Ambulatory Visit: Payer: Self-pay | Admitting: Certified Nurse Midwife

## 2021-02-21 DIAGNOSIS — N912 Amenorrhea, unspecified: Secondary | ICD-10-CM | POA: Diagnosis not present

## 2021-02-27 ENCOUNTER — Other Ambulatory Visit: Payer: Self-pay | Admitting: Certified Nurse Midwife

## 2021-02-28 ENCOUNTER — Other Ambulatory Visit: Payer: Self-pay

## 2021-02-28 MED ORDER — PROMETHAZINE HCL 25 MG PO TABS: 25.0000 mg | ORAL_TABLET | Freq: Four times a day (QID) | ORAL | 1 refills | Status: DC | PRN

## 2021-03-01 ENCOUNTER — Other Ambulatory Visit: Payer: Self-pay

## 2021-03-01 ENCOUNTER — Ambulatory Visit (INDEPENDENT_AMBULATORY_CARE_PROVIDER_SITE_OTHER): Admitting: Surgical

## 2021-03-01 VITALS — BP 113/61 | HR 76 | Ht 63.5 in | Wt 131.6 lb

## 2021-03-01 DIAGNOSIS — Z3491 Encounter for supervision of normal pregnancy, unspecified, first trimester: Secondary | ICD-10-CM

## 2021-03-01 NOTE — Progress Notes (Signed)
Lucretia Roers presents for NOB nurse interview visit. Pregnancy confirmation done __4/27/2022. G4. P-2012 Pregnancy education material explained and given. 0 cats in home. NOB labs ordered. HIV labs and drug screen were explained and ordered. PNV encouraged. Genetic screening options discussed. Genetic testing: Ordered/Declined/Unsure. Patient may discuss with the provider. Patient to follow up with provider in __2__ weeks for NOB physical. All questions answered.

## 2021-03-01 NOTE — Patient Instructions (Signed)
[email or web address]">  First Trimester of Pregnancy  The first trimester of pregnancy starts on the first day of your last menstrual period until the end of week 12. This is also called months 1 through 3 ofpregnancy. Body changes during your first trimester Your body goes through many changes during pregnancy. The changes usuallyreturn to normal after your baby is born. Physical changes You may gain or lose weight. Your breasts may grow larger and hurt. The area around your nipples may get darker. Dark spots or blotches may develop on your face. You may have changes in your hair. Health changes You may feel like you might vomit (nauseous), and you may vomit. You may have heartburn. You may have headaches. You may have trouble pooping (constipation). Your gums may bleed. Other changes You may get tired easily. You may pee (urinate) more often. Your menstrual periods will stop. You may not feel hungry. You may want to eat certain kinds of food. You may have changes in your emotions from day to day. You may have more dreams. Follow these instructions at home: Medicines Take over-the-counter and prescription medicines only as told by your doctor. Some medicines are not safe during pregnancy. Take a prenatal vitamin that contains at least 600 micrograms (mcg) of folic acid. Eating and drinking Eat healthy meals that include: Fresh fruits and vegetables. Whole grains. Good sources of protein, such as meat, eggs, or tofu. Low-fat dairy products. Avoid raw meat and unpasteurized juice, milk, and cheese. If you feel like you may vomit, or you vomit: Eat 4 or 5 small meals a day instead of 3 large meals. Try eating a few soda crackers. Drink liquids between meals instead of during meals. You may need to take these actions to prevent or treat trouble pooping: Drink enough fluids to keep your pee (urine) pale yellow. Eat foods that are high in fiber. These  include beans, whole grains, and fresh fruits and vegetables. Limit foods that are high in fat and sugar. These include fried or sweet foods. Activity Exercise only as told by your doctor. Most people can do their usual exercise routine during pregnancy. Stop exercising if you have cramps or pain in your lower belly (abdomen) or low back. Do not exercise if it is too hot or too humid, or if you are in a place of great height (high altitude). Avoid heavy lifting. If you choose to, you may have sex unless your doctor tells you not to. Relieving pain and discomfort Wear a good support bra if your breasts are sore. Rest with your legs raised (elevated) if you have leg cramps or low back pain. If you have bulging veins (varicose veins) in your legs: Wear support hose as told by your doctor. Raise your feet for 15 minutes, 3-4 times a day. Limit salt in your food. Safety Wear your seat belt at all times when you are in a car. Talk with your doctor if someone is hurting you or yelling at you. Talk with your doctor if you are feeling sad or have thoughts of hurting yourself. Lifestyle Do not use hot tubs, steam rooms, or saunas. Do not douche. Do not use tampons or scented sanitary pads. Do not use herbal medicines, illegal drugs, or medicines that are not approved by your doctor. Do not drink alcohol. Do not smoke or use any products that contain nicotine or tobacco. If you need help quitting, ask your doctor. Avoid cat litter boxes and soil that is used by cats. These  carry germs that can cause harm to the baby and can cause a loss of your baby by miscarriage or stillbirth. General instructions Keep all follow-up visits. This is important. Ask for help if you need counseling or if you need help with nutrition. Your doctor can give you advice or tell you where to go for help. Visit your dentist. At home, brush your teeth with a soft toothbrush. Floss gently. Write down your questions. Take them  to your prenatal visits. Where to find more information American Pregnancy Association: americanpregnancy.org [employer]: www.acog.org Office on Women's Health: KeywordPortfolios.com.br Contact a doctor if: You are dizzy. You have a fever. You have mild cramps or pressure in your lower belly. You have a nagging pain in your belly area. You continue to feel like you may vomit, you vomit, or you have watery poop (diarrhea) for 24 hours or longer. You have a bad-smelling fluid coming from your vagina. You have pain when you pee. You are exposed to a disease that spreads from person to person, such as chickenpox, measles, Zika virus, HIV, or hepatitis. Get help right away if: You have spotting or bleeding from your vagina. You have very bad belly cramping or pain. You have shortness of breath or chest pain. You have any kind of injury, such as from a fall or a car crash. You have new or increased pain, swelling, or redness in an arm or leg. Summary The first trimester of pregnancy starts on the first day of your last menstrual period until the end of week 12 (months 1 through 3). Eat 4 or 5 small meals a day instead of 3 large meals. Do not smoke or use any products that contain nicotine or tobacco. If you need help quitting, ask your doctor. Keep all follow-up visits. This information is not intended to replace advice given to you by your health care provider. Make sure you discuss any questions you have with your healthcare provider. Document Revised: 02/15/2020 Document Reviewed: 12/22/2019 Elsevier Patient Education  [place].   Common safe in Pregnancy  Acne:      Constipation:  Benzoyl Peroxide     Colace  Clindamycin      Dulcolax Suppository  Topica Erythromycin     Fibercon  Salicylic Acid      Metamucil         Miralax AVOID:        Senakot   Accutane    Cough:  Retin-A       Cough Drops  Tetracycline      Phenergan  w/ Codeine if Rx  Minocycline      Robitussin (Plain & DM)  Antibiotics:     Crabs/Lice:  Ceclor       RID  Cephalosporins    AVOID:  E-Mycins      Kwell  Keflex  Macrobid/Macrodantin   Diarrhea:  Penicillin      Kao-Pectate  Zithromax      Imodium AD         PUSH FLUIDS AVOID:       Cipro     Fever:  Tetracycline      Tylenol (Regular or Extra  Minocycline       Strength)  Levaquin      Extra Strength-Do not          Exceed 8 tabs/24 hrs Caffeine:        '200mg'$ /day (equiv. To 1 cup of coffee or  approx. 3 12  oz sodas)         Gas: Cold/Hayfever:       Gas-X  Benadryl      Mylicon  Claritin       Phazyme  **Claritin-D        Chlor-Trimeton    Headaches:  Dimetapp      ASA-Free Excedrin  Drixoral-Non-Drowsy     Cold Compress  Mucinex (Guaifenasin)     Tylenol (Regular or Extra  Sudafed/Sudafed-12 Hour     Strength)  **Sudafed PE Pseudoephedrine   Tylenol Cold & Sinus     Vicks Vapor Rub  Zyrtec  **AVOID if Problems With Blood Pressure         Heartburn: Avoid lying down for at least 1 hour after meals  Aciphex      Maalox     Rash:  Milk of Magnesia     Benadryl    Mylanta       1% Hydrocortisone Cream  Pepcid  Pepcid Complete   Sleep Aids:  Prevacid      Ambien   Prilosec       Benadryl  Rolaids       Chamomile Tea  Tums (Limit 4/day)     Unisom         Tylenol PM         Warm milk-add vanilla or  Hemorrhoids:       Sugar for taste  Anusol/Anusol H.C.  (RX: Analapram 2.5%)  Sugar Substitutes:  Hydrocortisone OTC     Ok in moderation  Preparation H      Tucks        Vaseline lotion applied to tissue with wiping    Herpes:     Throat:  Acyclovir      Oragel  Famvir  Valtrex     Vaccines:         Flu Shot Leg Cramps:       *Gardasil  Benadryl      Hepatitis A         Hepatitis B Nasal Spray:       Pneumovax  Saline Nasal Spray     Polio Booster         Tetanus Nausea:       Tuberculosis test or PPD  Vitamin B6 25 mg  TID   AVOID:    Dramamine      *Gardasil  Emetrol       Live Poliovirus  Ginger Root 250 mg QID    MMR (measles, mumps &  High Complex Carbs @ Bedtime    rebella)  Sea Bands-Accupressure    Varicella (Chickenpox)  Unisom 1/2 tab TID     *No known complications           If received before Pain:         Known pregnancy;   Darvocet       Resume series after  Lortab        Delivery  Percocet    Yeast:   Tramadol      Femstat  Tylenol 3      Gyne-lotrimin  Ultram       Monistat  Vicodin           MISC:         All Sunscreens           Hair Coloring/highlights          Insect Repellant's          (Including DEET)  Mystic Tans

## 2021-03-02 LAB — VIRAL HEPATITIS HBV, HCV
HCV Ab: <0.1 s/co ratio (ref 0.0–0.9)
Hep B Core Total Ab: NEGATIVE
Hep B Surface Ab, Qual: REACTIVE
Hepatitis B Surface Ag: NEGATIVE

## 2021-03-02 LAB — MONITOR DRUG PROFILE 14(MW)
Amphetamine Scrn, Ur: NEGATIVE ng/mL
BARBITURATE SCREEN URINE: NEGATIVE ng/mL
BENZODIAZEPINE SCREEN, URINE: NEGATIVE ng/mL
Buprenorphine, Urine: NEGATIVE ng/mL
CANNABINOIDS UR QL SCN: NEGATIVE ng/mL
Cocaine (Metab) Scrn, Ur: NEGATIVE ng/mL
Creatinine(Crt), U: 135.5 mg/dL (ref 20.0–300.0)
Fentanyl, Urine: NEGATIVE pg/mL
Meperidine Screen, Urine: NEGATIVE ng/mL
Methadone Screen, Urine: NEGATIVE ng/mL
OXYCODONE+OXYMORPHONE UR QL SCN: NEGATIVE ng/mL
Opiate Scrn, Ur: NEGATIVE ng/mL
Ph of Urine: 6.3 (ref 4.5–8.9)
Phencyclidine Qn, Ur: NEGATIVE ng/mL
Propoxyphene Scrn, Ur: NEGATIVE ng/mL
SPECIFIC GRAVITY: 1.021
Tramadol Screen, Urine: NEGATIVE ng/mL

## 2021-03-02 LAB — URINALYSIS, ROUTINE W REFLEX MICROSCOPIC
Bilirubin, UA: NEGATIVE
Glucose, UA: NEGATIVE
Leukocytes,UA: NEGATIVE
Nitrite, UA: NEGATIVE
Protein,UA: NEGATIVE
RBC, UA: NEGATIVE
Specific Gravity, UA: 1.02 (ref 1.005–1.030)
Urobilinogen, Ur: 0.2 mg/dL (ref 0.2–1.0)
pH, UA: 6.5 (ref 5.0–7.5)

## 2021-03-02 LAB — VARICELLA ZOSTER ANTIBODY, IGG: Varicella zoster IgG: 703 index (ref >165)

## 2021-03-02 LAB — TOXOPLASMA ANTIBODIES- IGG AND  IGM
Toxoplasma Antibody- IgM: <3 AU/mL (ref 0.0–7.9)
Toxoplasma IgG Ratio: <3 IU/mL (ref 0.0–7.1)

## 2021-03-02 LAB — ANTIBODY SCREEN: Antibody Screen: NEGATIVE

## 2021-03-02 LAB — RUBELLA SCREEN: Rubella Antibodies, IGG: 1.83 index (ref >0.99)

## 2021-03-02 LAB — HCV INTERPRETATION

## 2021-03-02 LAB — HIV ANTIBODY (ROUTINE TESTING W REFLEX): HIV Screen 4th Generation wRfx: NONREACTIVE

## 2021-03-02 LAB — ABO AND RH: Rh Factor: POSITIVE

## 2021-03-02 LAB — RPR: RPR Ser Ql: NONREACTIVE

## 2021-03-03 LAB — GC/CHLAMYDIA PROBE AMP
Chlamydia trachomatis, NAA: NEGATIVE
Neisseria Gonorrhoeae by PCR: NEGATIVE

## 2021-03-05 ENCOUNTER — Other Ambulatory Visit: Payer: Self-pay | Admitting: Certified Nurse Midwife

## 2021-03-05 LAB — CULTURE, OB URINE

## 2021-03-05 LAB — URINE CULTURE, OB REFLEX

## 2021-03-05 MED ORDER — AMPICILLIN 500 MG PO CAPS: 500.0000 mg | ORAL_CAPSULE | Freq: Four times a day (QID) | ORAL | 0 refills | Status: AC

## 2021-03-16 ENCOUNTER — Emergency Department

## 2021-03-16 ENCOUNTER — Other Ambulatory Visit: Payer: Self-pay

## 2021-03-16 ENCOUNTER — Emergency Department
Admission: EM | Admit: 2021-03-16 | Discharge: 2021-03-16 | Disposition: A | Discharge status: Home / Self Care | Attending: Emergency Medicine | Admitting: Emergency Medicine

## 2021-03-16 DIAGNOSIS — Z20822 Contact with and (suspected) exposure to covid-19: Secondary | ICD-10-CM | POA: Insufficient documentation

## 2021-03-16 DIAGNOSIS — R42 Dizziness and giddiness: Secondary | ICD-10-CM | POA: Insufficient documentation

## 2021-03-16 DIAGNOSIS — O26891 Other specified pregnancy related conditions, first trimester: Secondary | ICD-10-CM | POA: Diagnosis not present

## 2021-03-16 DIAGNOSIS — Z3A13 13 weeks gestation of pregnancy: Secondary | ICD-10-CM | POA: Insufficient documentation

## 2021-03-16 DIAGNOSIS — R1032 Left lower quadrant pain: Secondary | ICD-10-CM | POA: Insufficient documentation

## 2021-03-16 DIAGNOSIS — O210 Mild hyperemesis gravidarum: Secondary | ICD-10-CM | POA: Insufficient documentation

## 2021-03-16 LAB — CBC
HCT: 34.7 % — ABNORMAL LOW (ref 36.0–46.0)
Hemoglobin: 12.3 g/dL (ref 12.0–15.0)
MCH: 29.9 pg (ref 26.0–34.0)
MCHC: 35.4 g/dL (ref 30.0–36.0)
MCV: 84.4 fL (ref 80.0–100.0)
Platelets: 215 10*3/uL (ref 150–400)
RBC: 4.11 MIL/uL (ref 3.87–5.11)
RDW: 12.8 % (ref 11.5–15.5)
WBC: 6.5 10*3/uL (ref 4.0–10.5)
nRBC: 0 % (ref 0.0–0.2)

## 2021-03-16 LAB — URINALYSIS, COMPLETE (UACMP) WITH MICROSCOPIC
Bacteria, UA: NONE SEEN
Bilirubin Urine: NEGATIVE
Glucose, UA: 50 mg/dL — AB
Hgb urine dipstick: NEGATIVE
Ketones, ur: 80 mg/dL — AB
Leukocytes,Ua: NEGATIVE
Nitrite: NEGATIVE
Protein, ur: NEGATIVE mg/dL
Specific Gravity, Urine: 1.013 (ref 1.005–1.030)
pH: 6 (ref 5.0–8.0)

## 2021-03-16 LAB — HEPATIC FUNCTION PANEL
ALT: 15 U/L (ref 0–44)
AST: 20 U/L (ref 15–41)
Albumin: 3.9 g/dL (ref 3.5–5.0)
Alkaline Phosphatase: 42 U/L (ref 38–126)
Bilirubin, Direct: 0.1 mg/dL (ref 0.0–0.2)
Indirect Bilirubin: 1.2 mg/dL — ABNORMAL HIGH (ref 0.3–0.9)
Total Bilirubin: 1.3 mg/dL — ABNORMAL HIGH (ref 0.3–1.2)
Total Protein: 7.2 g/dL (ref 6.5–8.1)

## 2021-03-16 LAB — BASIC METABOLIC PANEL
Anion gap: 10 (ref 5–15)
BUN: 8 mg/dL (ref 6–20)
CO2: 19 mmol/L — ABNORMAL LOW (ref 22–32)
Calcium: 9 mg/dL (ref 8.9–10.3)
Chloride: 105 mmol/L (ref 98–111)
Creatinine, Ser: 0.68 mg/dL (ref 0.44–1.00)
GFR, Estimated: >60 mL/min (ref >60)
Glucose, Bld: 87 mg/dL (ref 70–99)
Potassium: 3.3 mmol/L — ABNORMAL LOW (ref 3.5–5.1)
Sodium: 134 mmol/L — ABNORMAL LOW (ref 135–145)

## 2021-03-16 LAB — RESP PANEL BY RT-PCR (FLU A&B, COVID) ARPGX2
Influenza A by PCR: NEGATIVE
Influenza B by PCR: NEGATIVE
SARS Coronavirus 2 by RT PCR: POSITIVE — AB

## 2021-03-16 LAB — ABO/RH: ABO/RH(D): A POS

## 2021-03-16 LAB — HCG, QUANTITATIVE, PREGNANCY: hCG, Beta Chain, Quant, S: 119299 m[IU]/mL — ABNORMAL HIGH (ref <5)

## 2021-03-16 LAB — TROPONIN I (HIGH SENSITIVITY): Troponin I (High Sensitivity): 3 ng/L (ref <18)

## 2021-03-16 MED ORDER — METOCLOPRAMIDE HCL 10 MG PO TABS: 10.0000 mg | ORAL_TABLET | Freq: Three times a day (TID) | ORAL | 0 refills | Status: DC

## 2021-03-16 MED ORDER — ACETAMINOPHEN 500 MG PO TABS
1000.0000 mg | ORAL_TABLET | Freq: Once | ORAL | Status: AC
  Administered 2021-03-16: 1000 mg via ORAL
  Filled 2021-03-16: qty 2

## 2021-03-16 MED ORDER — METOCLOPRAMIDE HCL 5 MG/ML IJ SOLN
10.0000 mg | Freq: Once | INTRAMUSCULAR | Status: AC
  Administered 2021-03-16: 10 mg via INTRAVENOUS
  Filled 2021-03-16: qty 2

## 2021-03-16 MED ORDER — ONDANSETRON HCL 4 MG/2ML IJ SOLN
4.0000 mg | Freq: Once | INTRAMUSCULAR | Status: AC
  Administered 2021-03-16: 4 mg via INTRAVENOUS
  Filled 2021-03-16: qty 2

## 2021-03-16 MED ORDER — POTASSIUM CHLORIDE CRYS ER 20 MEQ PO TBCR
40.0000 meq | EXTENDED_RELEASE_TABLET | Freq: Once | ORAL | Status: DC
  Filled 2021-03-16: qty 2

## 2021-03-16 MED ORDER — DEXTROSE 5 % IV BOLUS
1000.0000 mL | Freq: Once | INTRAVENOUS | Status: AC
  Administered 2021-03-16: 1000 mL via INTRAVENOUS

## 2021-03-16 MED ORDER — SODIUM CHLORIDE 0.9 % IV SOLN: Freq: Once | INTRAVENOUS | Status: AC

## 2021-03-16 NOTE — ED Notes (Signed)
Pt calm , collective, denied pain or sob. Pt ambulatory upon discharge. Pt understood risk factors regarding leaving against medical care, and further evaluation which was discussed by MD. Fuller Plan

## 2021-03-16 NOTE — ED Notes (Signed)
Back from xray
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
## 2021-03-16 NOTE — ED Provider Notes (Signed)
Emergency Medicine Provider Triage Evaluation Note  Christina Ruiz , a 30 y.o. female  was evaluated in triage.  Pt complains of dizziness, nausea and vomiting x 24 hours. Known pregnancy, hasnt been able to keep anything down. No known fevers, reports associated mild shortness of breath but thinks its due to her dizziness  Review of Systems  Positive: Dizziness, nausea, vomiting Negative: Fever, abdominal pain  Physical Exam  BP 120/78   Pulse (!) 119   Temp 98.9 F (37.2 C) (Oral)   Resp 19   Ht 5' 3.5" (1.613 m)   Wt 59.7 kg   LMP  (LMP Unknown)   SpO2 100%   BMI 22.95 kg/m  Gen:   Awake, but noted 2 episodes of syncope in triage Resp:  Normal effort  MSK:   Moves extremities without difficulty  Other:    Medical Decision Making  Medically screening exam initiated at 4:23 PM.  Appropriate orders placed.  Christina Ruiz was informed that the remainder of the evaluation will be completed by another provider, this initial triage assessment does not replace that evaluation, and the importance of remaining in the ED until their evaluation is complete.  Discussed patient with Dr. Roxan Hockey and patient was taken immediately to room.    Lucy Chris, PA 03/16/21 1631    Gilles Chiquito, MD 03/16/21 [phone]

## 2021-03-16 NOTE — Discharge Instructions (Addendum)
You have elected to want to leave prior to finishing our work-up.  We wanted to do an ultrasound to evaluate baby and your ovary.  However you have elected to want to leave at this time we recommend you return to the ER if you develop worsening symptoms or would like further work-up otherwise you should follow-up with your OB/GYN.  In the meantime we have prescribed you some medication to help with the nausea.

## 2021-03-16 NOTE — ED Notes (Signed)
2038, positive covid result called from lab. Dr. Fuller Plan notified. No new orders received.  2044- notified pt via telephone of positive covid results.

## 2021-03-16 NOTE — ED Notes (Signed)
Husband at bedside.  

## 2021-03-16 NOTE — ED Triage Notes (Signed)
Pt to ER via POV with complaints of lightheadedness/ weakness. Reports constant nausea. Emesis since this time yesterday, has been unable to tolerate liquids or food. Pt [redacted] weeks pregnant. EDD 09/24/21. Denies abdominal pain/ other complaints.

## 2021-03-16 NOTE — ED Notes (Signed)
Called lab regarding blood ABO/Rh

## 2021-03-18 ENCOUNTER — Encounter: Admitting: Certified Nurse Midwife

## 2021-03-28 ENCOUNTER — Other Ambulatory Visit: Payer: Self-pay

## 2021-03-28 ENCOUNTER — Ambulatory Visit (INDEPENDENT_AMBULATORY_CARE_PROVIDER_SITE_OTHER): Admitting: Certified Nurse Midwife

## 2021-03-28 VITALS — BP 107/74 | HR 71 | Wt 125.3 lb

## 2021-03-28 DIAGNOSIS — Z3482 Encounter for supervision of other normal pregnancy, second trimester: Secondary | ICD-10-CM

## 2021-03-28 DIAGNOSIS — Z1379 Encounter for other screening for genetic and chromosomal anomalies: Secondary | ICD-10-CM

## 2021-03-28 DIAGNOSIS — O09892 Supervision of other high risk pregnancies, second trimester: Secondary | ICD-10-CM

## 2021-03-28 DIAGNOSIS — N644 Mastodynia: Secondary | ICD-10-CM

## 2021-03-28 DIAGNOSIS — Z8616 Personal history of COVID-19: Secondary | ICD-10-CM

## 2021-03-28 DIAGNOSIS — Z8659 Personal history of other mental and behavioral disorders: Secondary | ICD-10-CM

## 2021-03-28 DIAGNOSIS — O219 Vomiting of pregnancy, unspecified: Secondary | ICD-10-CM

## 2021-03-28 DIAGNOSIS — Z3A14 14 weeks gestation of pregnancy: Secondary | ICD-10-CM

## 2021-03-28 LAB — POCT URINALYSIS DIPSTICK OB
Blood, UA: NEGATIVE
Glucose, UA: NEGATIVE
Leukocytes, UA: NEGATIVE
Spec Grav, UA: 1.025 (ref 1.010–1.025)
pH, UA: 6 (ref 5.0–8.0)

## 2021-03-28 MED ORDER — PROCHLORPERAZINE MALEATE 10 MG PO TABS: 10.0000 mg | ORAL_TABLET | Freq: Four times a day (QID) | ORAL | 3 refills | Status: DC | PRN

## 2021-03-28 MED ORDER — METOCLOPRAMIDE HCL 10 MG PO TABS: 10.0000 mg | ORAL_TABLET | Freq: Three times a day (TID) | ORAL | 3 refills | Status: DC | PRN

## 2021-03-28 MED ORDER — FAMOTIDINE 20 MG PO TABS: 20.0000 mg | ORAL_TABLET | Freq: Two times a day (BID) | ORAL | 3 refills | Status: DC

## 2021-03-28 NOTE — Patient Instructions (Signed)
Second Trimester of Pregnancy °The second trimester of pregnancy is from week 13 through week 27. This is also called months 4 through 6 of pregnancy. This is often the time when you feel your best. °During the second trimester: °Morning sickness is less or has stopped. °You may have more energy. °You may feel hungry more often. °At this time, your unborn baby (fetus) is growing very fast. At the end of the sixth month, the unborn baby may be up to 12 inches long and weigh about 1½ pounds. You will likely start to feel the baby move between 16 and 20 weeks of pregnancy. °Body changes during your second trimester °Your body continues to go through many changes during this time. The changes vary and generally return to normal after the baby is born. °Physical changes °You will gain more weight. °You may start to get stretch marks on your hips, belly (abdomen), and breasts. °Your breasts will grow and may hurt. °Dark spots or blotches may develop on your face. °A dark line from your belly button to the pubic area (linea nigra) may appear. °You may have changes in your hair. °Health changes °You may have headaches. °You may have heartburn. °You may have trouble pooping (constipation). °You may have hemorrhoids or swollen, bulging veins (varicose veins). °Your gums may bleed. °You may pee (urinate) more often. °You may have back pain. °Follow these instructions at home: °Medicines °Take over-the-counter and prescription medicines only as told by your doctor. Some medicines are not safe during pregnancy. °Take a prenatal vitamin that contains at least 600 micrograms (mcg) of folic acid. °Eating and drinking °Eat healthy meals that include: °Fresh fruits and vegetables. °Whole grains. °Good sources of protein, such as meat, eggs, or tofu. °Low-fat dairy products. °Avoid raw meat and unpasteurized juice, milk, and cheese. °You may need to take these actions to prevent or treat trouble pooping: °Drink enough fluids to keep  your pee (urine) pale yellow. °Eat foods that are high in fiber. These include beans, whole grains, and fresh fruits and vegetables. °Limit foods that are high in fat and sugar. These include fried or sweet foods. °Activity °Exercise only as told by your doctor. Most people can do their usual exercise during pregnancy. Try to exercise for 30 minutes at least 5 days a week. °Stop exercising if you have pain or cramps in your belly or lower back. °Do not exercise if it is too hot or too humid, or if you are in a place of great height (high altitude). °Avoid heavy lifting. °If you choose to, you may have sex unless your doctor tells you not to. °Relieving pain and discomfort °Wear a good support bra if your breasts are sore. °Take warm water baths (sitz baths) to soothe pain or discomfort caused by hemorrhoids. Use hemorrhoid cream if your doctor approves. °Rest with your legs raised (elevated) if you have leg cramps or low back pain. °If you develop bulging veins in your legs: °Wear support hose as told by your doctor. °Raise your feet for 15 minutes, 3-4 times a day. °Limit salt in your food. °Safety °Wear your seat belt at all times when you are in a car. °Talk with your doctor if someone is hurting you or yelling at you a lot. °Lifestyle °Do not use hot tubs, steam rooms, or saunas. °Do not douche. Do not use tampons or scented sanitary pads. °Avoid cat litter boxes and soil used by cats. These carry germs that can harm your baby and   can cause a loss of your baby by miscarriage or stillbirth. °Do not use herbal medicines, illegal drugs, or medicines that are not approved by your doctor. Do not drink alcohol. °Do not smoke or use any products that contain nicotine or tobacco. If you need help quitting, ask your doctor. °General instructions °Keep all follow-up visits. This is important. °Ask your doctor about local prenatal classes. °Ask your doctor about the right foods to eat or for help finding a  counselor. °Where to find more information °American Pregnancy Association: americanpregnancy.org °American College of Obstetricians and Gynecologists: www.acog.org °Office on Women's Health: womenshealth.gov/pregnancy °Contact a doctor if: °You have a headache that does not go away when you take medicine. °You have changes in how you see, or you see spots in front of your eyes. °You have mild cramps, pressure, or pain in your lower belly. °You continue to feel like you may vomit (nauseous), you vomit, or you have watery poop (diarrhea). °You have bad-smelling fluid coming from your vagina. °You have pain when you pee or your pee smells bad. °You have very bad swelling of your face, hands, ankles, feet, or legs. °You have a fever. °Get help right away if: °You are leaking fluid from your vagina. °You have spotting or bleeding from your vagina. °You have very bad belly cramping or pain. °You have trouble breathing. °You have chest pain. °You faint. °You have not felt your baby move for the time period told by your doctor. °You have new or increased pain, swelling, or redness in an arm or leg. °Summary °The second trimester of pregnancy is from week 13 through week 27 (months 4 through 6). °Eat healthy meals. °Exercise as told by your doctor. Most people can do their usual exercise during pregnancy. °Do not use herbal medicines, illegal drugs, or medicines that are not approved by your doctor. Do not drink alcohol. °Call your doctor if you get sick or if you notice anything unusual about your pregnancy. °This information is not intended to replace advice given to you by your health care provider. Make sure you discuss any questions you have with your health care provider. °Document Revised: 02/15/2020 Document Reviewed: 12/22/2019 °Elsevier Patient Education © 2022 [employer] ° °

## 2021-03-28 NOTE — Progress Notes (Signed)
NOB Physical: She has nausea and dizziness.
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
## 2021-03-29 LAB — CBC WITH DIFFERENTIAL/PLATELET
Basophils Absolute: 0 10*3/uL (ref 0.0–0.2)
Basos: 0 %
EOS (ABSOLUTE): 0.1 10*3/uL (ref 0.0–0.4)
Eos: 1 %
Hematocrit: 34.8 % (ref 34.0–46.6)
Hemoglobin: 12.1 g/dL (ref 11.1–15.9)
Immature Grans (Abs): 0 10*3/uL (ref 0.0–0.1)
Immature Granulocytes: 1 %
Lymphocytes Absolute: 2 10*3/uL (ref 0.7–3.1)
Lymphs: 32 %
MCH: 29.7 pg (ref 26.6–33.0)
MCHC: 34.8 g/dL (ref 31.5–35.7)
MCV: 86 fL (ref 79–97)
Monocytes Absolute: 0.5 10*3/uL (ref 0.1–0.9)
Monocytes: 9 %
Neutrophils Absolute: 3.5 10*3/uL (ref 1.4–7.0)
Neutrophils: 57 %
Platelets: 314 10*3/uL (ref 150–450)
RBC: 4.07 x10E6/uL (ref 3.77–5.28)
RDW: 13.1 % (ref 11.7–15.4)
WBC: 6.2 10*3/uL (ref 3.4–10.8)

## 2021-04-03 ENCOUNTER — Other Ambulatory Visit: Payer: Self-pay

## 2021-04-03 ENCOUNTER — Ambulatory Visit (INDEPENDENT_AMBULATORY_CARE_PROVIDER_SITE_OTHER): Admitting: Certified Nurse Midwife

## 2021-04-03 VITALS — BP 103/66 | HR 96 | Wt 127.3 lb

## 2021-04-03 DIAGNOSIS — R350 Frequency of micturition: Secondary | ICD-10-CM

## 2021-04-03 DIAGNOSIS — R3915 Urgency of urination: Secondary | ICD-10-CM

## 2021-04-03 LAB — POCT URINALYSIS DIPSTICK
Bilirubin, UA: NEGATIVE
Blood, UA: NEGATIVE
Glucose, UA: NEGATIVE
Ketones, UA: NEGATIVE
Leukocytes, UA: NEGATIVE
Nitrite, UA: NEGATIVE
Protein, UA: POSITIVE — AB
Spec Grav, UA: 1.01 (ref 1.010–1.025)
Urobilinogen, UA: 0.2 E.U./dL
pH, UA: 7 (ref 5.0–8.0)

## 2021-04-03 NOTE — Patient Instructions (Signed)
Pregnancy and Urinary Tract Infection  A urinary tract infection (UTI) is an infection of any part of the urinary tract. This includes the kidneys, the tubes that connect your kidneys to your bladder (ureters), the bladder, and the tube that carries urine out of your body (urethra). These organs make, store, and get rid of urine in the body. Your health care provider may use other names to describe the infection. An upper UTI affects the ureters and kidneys (pyelonephritis). A lower UTI affects the bladder (cystitis) and urethra (urethritis). Most urinary tract infections are caused by bacteria in your genital area, around the entrance to your urinary tract (urethra). These bacteria grow and cause irritation and inflammation of your urinary tract. You are more likely to develop a UTI during pregnancy because the physical and hormonal changes your body goes through can make it easier for bacteria to get into your urinary tract. Your growing baby also puts pressure on your bladder and can affect urine flow. It is important to recognize and treat UTIs in pregnancy because of the risk of serious complications for bothyou and your baby. How does this affect me? Symptoms of a UTI include: Needing to urinate right away (urgently). Frequent urination or passing small amounts of urine frequently. Pain or burning with urination. Blood in the urine. Urine that smells bad or unusual. Trouble urinating. Cloudy urine. Pain in the abdomen or lower back. Vaginal discharge. You may also have: Vomiting or a decreased appetite. Confusion. Irritability or tiredness. A fever. Diarrhea. How does this affect my baby? An untreated UTI during pregnancy could lead to a kidney infection or a systemic infection, which can cause health problems that could affect your baby. Possible complications of an untreated UTI include: Giving birth to your baby before 37 weeks of pregnancy (premature). Having a baby with a low birth  weight. Developing high blood pressure during pregnancy (preeclampsia). Having a low hemoglobin level (anemia). What can I do to lower my risk? To prevent a UTI: Go to the bathroom as soon as you feel the need. Do not hold urine for long periods of time. Always wipe from front to back, especially after a bowel movement. Use each tissue one time when you wipe. Empty your bladder after sex. Keep your genital area dry. Drink 6-10 glasses of water each day. Do not douche or use deodorant sprays. How is this treated? Treatment for this condition may include: Antibiotic medicines that are safe to take during pregnancy. Other medicines to treat less common causes of UTI. Follow these instructions at home: If you were prescribed an antibiotic medicine, take it as told by your health care provider. Do not stop using the antibiotic even if you start to feel better. Keep all follow-up visits as told by your health care provider. This is important. Contact a health care provider if: Your symptoms do not improve or they get worse. You have abnormal vaginal discharge. Get help right away if you: Have a fever. Have nausea and vomiting. Have back or side pain. Feel contractions in your uterus. Have lower belly pain. Have a gush of fluid from your vagina. Have blood in your urine. Summary A urinary tract infection (UTI) is an infection of any part of the urinary tract, which includes the kidneys, ureters, bladder, and urethra. Most urinary tract infections are caused by bacteria in your genital area, around the entrance to your urinary tract (urethra). You are more likely to develop a UTI during pregnancy. If you were prescribed  an antibiotic medicine, take it as told by your health care provider. Do not stop using the antibiotic even if you start to feel better. This information is not intended to replace advice given to you by your health care provider. Make sure you discuss any questions you have  with your healthcare provider. Document Revised: 12/31/2018 Document Reviewed: 08/12/2018 Elsevier Patient Education  2022 Elsevier Inc.  

## 2021-04-03 NOTE — Addendum Note (Signed)
Addended by: Lady Deutscher on: 04/03/2021 09:20 AM   Modules accepted: Orders

## 2021-04-03 NOTE — Progress Notes (Signed)
PT presents today for c/ o of urinary frequency. Has had UTI at [facility] appointment and was treated. Urine dip today was negative . Will culture for TOC. Will follow up with results. ROB as scheduled...   Doreene Burke, CNM

## 2021-04-05 LAB — URINE CULTURE

## 2021-04-08 ENCOUNTER — Telehealth: Payer: Self-pay | Admitting: Certified Nurse Midwife

## 2021-04-08 NOTE — Telephone Encounter (Signed)
Spoke with patient in regards to genetic testing, patient would like to receive results via envelope. Pt aware that her results are ready for pickup.

## 2021-04-08 NOTE — Telephone Encounter (Signed)
Christina Ruiz called in and stated she has never heard anything about her genetic testing and would like to know the status.  Christina Ruiz states she had it about 2 weeks ago.  Also, Christina Ruiz states she has some questions and concerns about her other labs and would like a phone call to discuss the results.  Please advise.  Call back number is [phone].

## 2021-04-09 ENCOUNTER — Other Ambulatory Visit: Payer: Self-pay | Admitting: Certified Nurse Midwife

## 2021-04-09 MED ORDER — CEPHALEXIN 500 MG PO CAPS: 500.0000 mg | ORAL_CAPSULE | Freq: Four times a day (QID) | ORAL | 0 refills | Status: AC

## 2021-04-24 ENCOUNTER — Other Ambulatory Visit: Payer: Self-pay

## 2021-04-24 ENCOUNTER — Ambulatory Visit (INDEPENDENT_AMBULATORY_CARE_PROVIDER_SITE_OTHER): Admitting: Certified Nurse Midwife

## 2021-04-24 ENCOUNTER — Encounter: Payer: Self-pay | Admitting: Certified Nurse Midwife

## 2021-04-24 VITALS — BP 104/73 | HR 98 | Wt 131.4 lb

## 2021-04-24 DIAGNOSIS — Z3482 Encounter for supervision of other normal pregnancy, second trimester: Secondary | ICD-10-CM

## 2021-04-24 DIAGNOSIS — Z3A18 18 weeks gestation of pregnancy: Secondary | ICD-10-CM

## 2021-04-24 LAB — POCT URINALYSIS DIPSTICK OB
Bilirubin, UA: NEGATIVE
Blood, UA: NEGATIVE
Glucose, UA: NEGATIVE
Ketones, UA: NEGATIVE
Leukocytes, UA: NEGATIVE
Nitrite, UA: NEGATIVE
POC,PROTEIN,UA: NEGATIVE
Spec Grav, UA: 1.02 (ref 1.010–1.025)
Urobilinogen, UA: 0.2 E.U./dL
pH, UA: 7.5 (ref 5.0–8.0)

## 2021-04-24 MED ORDER — NITROFURANTOIN MONOHYD MACRO 100 MG PO CAPS
100.0000 mg | ORAL_CAPSULE | Freq: Every day | ORAL | 4 refills | Status: DC
Start: 2021-04-24 — End: 2021-07-22

## 2021-04-24 NOTE — Progress Notes (Signed)
ROB doing better, nausea is not as bad. She has completed antibiotic course from UTI, repeat urine culture today. Placed on daily suppression with macrobid. Discussed anatomy u/s in the next few wk. She verbalizes and agrees. Follow rob in 3 wk .   Doreene Burke, CNM

## 2021-04-24 NOTE — Patient Instructions (Signed)
Round Ligament Pain  The round ligament is a cord of muscle and tissue that helps support the uterus. It can become a source of pain during pregnancy if it becomes stretched or twisted as the baby grows. The pain usually begins in the second trimester (13-28 weeks) of pregnancy, and it can come and go until the baby is delivered.It is not a serious problem, and it does not cause harm to the baby. Round ligament pain is usually a short, sharp, and pinching pain, but it can also be a dull, lingering, and aching pain. The pain is felt in the lower side of the abdomen or in the groin. It usually starts deep in the groin and moves up to the outside of the hip area. The pain may occur when you: Suddenly change position, such as quickly going from a sitting to standing position. Roll over in bed. Cough or sneeze. Do physical activity. Follow these instructions at home:  Watch your condition for any changes. When the pain starts, relax. Then try any of these methods to help with the pain: Sitting down. Flexing your knees up to your abdomen. Lying on your side with one pillow under your abdomen and another pillow between your legs. Sitting in a warm bath for 15-20 minutes or until the pain goes away. Take over-the-counter and prescription medicines only as told by your health care provider. Move slowly when you sit down or stand up. Avoid long walks if they cause pain. Stop or reduce your physical activities if they cause pain. Keep all follow-up visits as told by your health care provider. This is important. Contact a health care provider if: Your pain does not go away with treatment. You feel pain in your back that you did not have before. Your medicine is not helping. Get help right away if: You have a fever or chills. You develop uterine contractions. You have vaginal bleeding. You have nausea or vomiting. You have diarrhea. You have pain when you urinate. Summary Round ligament pain is  felt in the lower abdomen or groin. It is usually a short, sharp, and pinching pain. It can also be a dull, lingering, and aching pain. This pain usually begins in the second trimester (13-28 weeks). It occurs because the uterus is stretching with the growing baby, and it is not harmful to the baby. You may notice the pain when you suddenly change position, when you cough or sneeze, or during physical activity. Relaxing, flexing your knees to your abdomen, lying on one side, or taking a warm bath may help to get rid of the pain. Get help from your health care provider if the pain does not go away or if you have vaginal bleeding, nausea, vomiting, diarrhea, or painful urination. This information is not intended to replace advice given to you by your health care provider. Make sure you discuss any questions you have with your healthcare provider. Document Revised: 08/24/2020 Document Reviewed: 02/24/2018 Elsevier Patient Education  2022 Elsevier Inc.  

## 2021-05-17 ENCOUNTER — Other Ambulatory Visit: Payer: Self-pay

## 2021-05-17 ENCOUNTER — Ambulatory Visit (INDEPENDENT_AMBULATORY_CARE_PROVIDER_SITE_OTHER): Admitting: Certified Nurse Midwife

## 2021-05-17 ENCOUNTER — Telehealth: Payer: Self-pay | Admitting: Certified Nurse Midwife

## 2021-05-17 ENCOUNTER — Encounter: Payer: Self-pay | Admitting: Certified Nurse Midwife

## 2021-05-17 VITALS — BP 118/72 | HR 93 | Wt 130.6 lb

## 2021-05-17 DIAGNOSIS — Z3482 Encounter for supervision of other normal pregnancy, second trimester: Secondary | ICD-10-CM

## 2021-05-17 DIAGNOSIS — Z3A21 21 weeks gestation of pregnancy: Secondary | ICD-10-CM

## 2021-05-17 LAB — POCT URINALYSIS DIPSTICK OB
Bilirubin, UA: NEGATIVE
Blood, UA: NEGATIVE
Glucose, UA: NEGATIVE
Ketones, UA: NEGATIVE
Nitrite, UA: NEGATIVE
Spec Grav, UA: <=1.005 — AB (ref 1.010–1.025)
Urobilinogen, UA: 0.2 E.U./dL
pH, UA: 8 (ref 5.0–8.0)

## 2021-05-17 NOTE — Patient Instructions (Signed)
Round Ligament Pain  The round ligament is a cord of muscle and tissue that helps support the uterus. It can become a source of pain during pregnancy if it becomes stretched or twisted as the baby grows. The pain usually begins in the second trimester (13-28 weeks) of pregnancy, and it can come and go until the baby is delivered.It is not a serious problem, and it does not cause harm to the baby. Round ligament pain is usually a short, sharp, and pinching pain, but it can also be a dull, lingering, and aching pain. The pain is felt in the lower side of the abdomen or in the groin. It usually starts deep in the groin and moves up to the outside of the hip area. The pain may occur when you: Suddenly change position, such as quickly going from a sitting to standing position. Roll over in bed. Cough or sneeze. Do physical activity. Follow these instructions at home:  Watch your condition for any changes. When the pain starts, relax. Then try any of these methods to help with the pain: Sitting down. Flexing your knees up to your abdomen. Lying on your side with one pillow under your abdomen and another pillow between your legs. Sitting in a warm bath for 15-20 minutes or until the pain goes away. Take over-the-counter and prescription medicines only as told by your health care provider. Move slowly when you sit down or stand up. Avoid long walks if they cause pain. Stop or reduce your physical activities if they cause pain. Keep all follow-up visits as told by your health care provider. This is important. Contact a health care provider if: Your pain does not go away with treatment. You feel pain in your back that you did not have before. Your medicine is not helping. Get help right away if: You have a fever or chills. You develop uterine contractions. You have vaginal bleeding. You have nausea or vomiting. You have diarrhea. You have pain when you urinate. Summary Round ligament pain is  felt in the lower abdomen or groin. It is usually a short, sharp, and pinching pain. It can also be a dull, lingering, and aching pain. This pain usually begins in the second trimester (13-28 weeks). It occurs because the uterus is stretching with the growing baby, and it is not harmful to the baby. You may notice the pain when you suddenly change position, when you cough or sneeze, or during physical activity. Relaxing, flexing your knees to your abdomen, lying on one side, or taking a warm bath may help to get rid of the pain. Get help from your health care provider if the pain does not go away or if you have vaginal bleeding, nausea, vomiting, diarrhea, or painful urination. This information is not intended to replace advice given to you by your health care provider. Make sure you discuss any questions you have with your healthcare provider. Document Revised: 08/24/2020 Document Reviewed: 02/24/2018 Elsevier Patient Education  2022 Elsevier Inc.  

## 2021-05-17 NOTE — Telephone Encounter (Signed)
Spoke with patient and explained the urine culture from today was just a TOC from previous infection. Patient verbalizes understanding and no further questions.

## 2021-05-17 NOTE — Telephone Encounter (Signed)
Pt is stating that she will probably need medication before the weekend from urine culture. Please advise.

## 2021-05-17 NOTE — Progress Notes (Signed)
ROB doing well, Feeling fetal movement. Discussed urine culture for TOC today. She verbalizes and agrees. Has anatomy u/s scheduled 8/31. Will follow up with results. Return in 3 wks for ROB.   Doreene Burke, CNM

## 2021-05-19 LAB — URINE CULTURE

## 2021-05-22 ENCOUNTER — Other Ambulatory Visit: Payer: Self-pay

## 2021-05-22 ENCOUNTER — Ambulatory Visit
Admission: RE | Admit: 2021-05-22 | Discharge: 2021-05-22 | Disposition: A | Discharge status: Home / Self Care | Source: Ambulatory Visit | Attending: Certified Nurse Midwife | Admitting: Certified Nurse Midwife

## 2021-05-22 DIAGNOSIS — Z3482 Encounter for supervision of other normal pregnancy, second trimester: Secondary | ICD-10-CM | POA: Insufficient documentation

## 2021-05-22 DIAGNOSIS — Z3A18 18 weeks gestation of pregnancy: Secondary | ICD-10-CM | POA: Insufficient documentation

## 2021-06-10 ENCOUNTER — Ambulatory Visit (INDEPENDENT_AMBULATORY_CARE_PROVIDER_SITE_OTHER): Admitting: Certified Nurse Midwife

## 2021-06-10 ENCOUNTER — Encounter: Payer: Self-pay | Admitting: Certified Nurse Midwife

## 2021-06-10 ENCOUNTER — Other Ambulatory Visit: Payer: Self-pay

## 2021-06-10 VITALS — BP 101/67 | HR 92 | Wt 137.9 lb

## 2021-06-10 DIAGNOSIS — Z3A24 24 weeks gestation of pregnancy: Secondary | ICD-10-CM

## 2021-06-10 DIAGNOSIS — Z3482 Encounter for supervision of other normal pregnancy, second trimester: Secondary | ICD-10-CM

## 2021-06-10 LAB — POCT URINALYSIS DIPSTICK OB
Bilirubin, UA: NEGATIVE
Blood, UA: NEGATIVE
Glucose, UA: NEGATIVE
Ketones, UA: NEGATIVE
Leukocytes, UA: NEGATIVE
Nitrite, UA: NEGATIVE
POC,PROTEIN,UA: NEGATIVE
Spec Grav, UA: 1.01 (ref 1.010–1.025)
Urobilinogen, UA: 0.2 E.U./dL
pH, UA: 6 (ref 5.0–8.0)

## 2021-06-10 NOTE — Progress Notes (Signed)
ROB doing well, discussed 28 wk labs next visit. She verbalize and agrees. Feeling good movement. Follow up 4 wks.   Doreene Burke, CNM

## 2021-06-10 NOTE — Patient Instructions (Signed)
Oral Glucose Tolerance Test During Pregnancy °Why am I having this test? °The oral glucose tolerance test (OGTT) is done to check how your body processes blood sugar (glucose). This is one of several tests used to diagnose diabetes that develops during pregnancy (gestational diabetes mellitus). Gestational diabetes is a short-term form of diabetes that some women develop while they are pregnant. It usually occurs during the second trimester of pregnancy and goes away after delivery. °Testing, or screening, for gestational diabetes usually occurs at weeks 24-28 of pregnancy. You may have the OGTT test after having a 1-hour glucose screening test if the results from that test indicate that you may have gestational diabetes. This test may also be needed if: °You have a history of gestational diabetes. °There is a history of giving birth to very large babies or of losing pregnancies (having stillbirths). °You have signs and symptoms of diabetes, such as: °Changes in your eyesight. °Tingling or numbness in your hands or feet. °Changes in hunger, thirst, and urination, and these are not explained by your pregnancy. °What is being tested? °This test measures the amount of glucose in your blood at different times during a period of 3 hours. This shows how well your body can process glucose. °What kind of sample is taken? °Blood samples are required for this test. They are usually collected by inserting a needle into a blood vessel. °How do I prepare for this test? °For 3 days before your test, eat normally. Have plenty of carbohydrate-rich foods. °Follow instructions from your health care provider about: °Eating or drinking restrictions on the day of the test. You may be asked not to eat or drink anything other than water (to fast) starting 8-10 hours before the test. °Changing or stopping your regular medicines. Some medicines may interfere with this test. °Tell a health care provider about: °All medicines you are taking,  including vitamins, herbs, eye drops, creams, and over-the-counter medicines. °Any blood disorders you have. °Any surgeries you have had. °Any medical conditions you have. °What happens during the test? °First, your blood glucose will be measured. This is referred to as your fasting blood glucose because you fasted before the test. Then, you will drink a glucose solution that contains a certain amount of glucose. Your blood glucose will be measured again 1, 2, and 3 hours after you drink the solution. °This test takes about 3 hours to complete. You will need to stay at the testing location during this time. During the testing period: °Do not eat or drink anything other than the glucose solution. °Do not exercise. °Do not use any products that contain nicotine or tobacco, such as cigarettes, e-cigarettes, and chewing tobacco. These can affect your test results. If you need help quitting, ask your health care provider. °The testing procedure may vary among health care providers and hospitals. °How are the results reported? °Your results will be reported as milligrams of glucose per deciliter of blood (mg/dL) or millimoles per liter (mmol/L). There is more than one source for screening and diagnosis reference values used to diagnose gestational diabetes. Your health care provider will compare your results to normal values that were established after testing a large group of people (reference values). Reference values may vary among labs and hospitals. For this test (Carpenter-Coustan), reference values are: °Fasting: 95 mg/dL (5.3 mmol/L). °1 hour: 180 mg/dL (10.0 mmol/L). °2 hour: 155 mg/dL (8.6 mmol/L). °3 hour: 140 mg/dL (7.8 mmol/L). °What do the results mean? °Results below the reference values are considered   normal. If two or more of your blood glucose levels are at or above the reference values, you may be diagnosed with gestational diabetes. If only one level is high, your health care provider may suggest  repeat testing or other tests to confirm a diagnosis. °Talk with your health care provider about what your results mean. °Questions to ask your health care provider °Ask your health care provider, or the department that is doing the test: °When will my results be ready? °How will I get my results? °What are my treatment options? °What other tests do I need? °What are my next steps? °Summary °The oral glucose tolerance test (OGTT) is one of several tests used to diagnose diabetes that develops during pregnancy (gestational diabetes mellitus). Gestational diabetes is a short-term form of diabetes that some women develop while they are pregnant. °You may have the OGTT test after having a 1-hour glucose screening test if the results from that test show that you may have gestational diabetes. You may also have this test if you have any symptoms or risk factors for this type of diabetes. °Talk with your health care provider about what your results mean. °This information is not intended to replace advice given to you by your health care provider. Make sure you discuss any questions you have with your health care provider. °Document Revised: 02/16/2020 Document Reviewed: 02/16/2020 °Elsevier Patient Education © 2022 Elsevier Inc. ° °

## 2021-07-05 ENCOUNTER — Other Ambulatory Visit: Payer: Self-pay

## 2021-07-05 DIAGNOSIS — Z3483 Encounter for supervision of other normal pregnancy, third trimester: Secondary | ICD-10-CM

## 2021-07-05 DIAGNOSIS — Z3A28 28 weeks gestation of pregnancy: Secondary | ICD-10-CM

## 2021-07-08 ENCOUNTER — Ambulatory Visit (INDEPENDENT_AMBULATORY_CARE_PROVIDER_SITE_OTHER): Admitting: Certified Nurse Midwife

## 2021-07-08 ENCOUNTER — Other Ambulatory Visit: Payer: Self-pay

## 2021-07-08 ENCOUNTER — Other Ambulatory Visit

## 2021-07-08 ENCOUNTER — Encounter: Payer: Self-pay | Admitting: Certified Nurse Midwife

## 2021-07-08 VITALS — BP 113/72 | HR 82 | Wt 142.2 lb

## 2021-07-08 DIAGNOSIS — Z23 Encounter for immunization: Secondary | ICD-10-CM | POA: Diagnosis not present

## 2021-07-08 DIAGNOSIS — Z3483 Encounter for supervision of other normal pregnancy, third trimester: Secondary | ICD-10-CM

## 2021-07-08 DIAGNOSIS — Z3A28 28 weeks gestation of pregnancy: Secondary | ICD-10-CM

## 2021-07-08 LAB — POCT URINALYSIS DIPSTICK OB
Bilirubin, UA: NEGATIVE
Blood, UA: NEGATIVE
Glucose, UA: NEGATIVE
Ketones, UA: NEGATIVE
Leukocytes, UA: NEGATIVE
Nitrite, UA: NEGATIVE
POC,PROTEIN,UA: NEGATIVE
Spec Grav, UA: 1.02 (ref 1.010–1.025)
Urobilinogen, UA: 0.2 E.U./dL
pH, UA: 7 (ref 5.0–8.0)

## 2021-07-08 MED ORDER — TETANUS-DIPHTH-ACELL PERTUSSIS 5-2.5-18.5 LF-MCG/0.5 IM SUSY
0.5000 mL | PREFILLED_SYRINGE | Freq: Once | INTRAMUSCULAR | Status: AC
  Administered 2021-07-08: 0.5 mL via INTRAMUSCULAR

## 2021-07-08 NOTE — Progress Notes (Signed)
OB-pt present for routine prenatal care, 28 week labs flu vacine and tdap vaccine.

## 2021-07-08 NOTE — Progress Notes (Signed)
ROB doing well. Feels good movement. 28 wk labs today: Glucose screen/RPR/CBC. Tdap done, Blood transfusion consent completed, all questions answered. Ready set baby reviewed, see check list for topics covered. Sample birth plan given, will follow up in upcoming visits. Discussed birth control after delivery, information pamphlet given. Planning IUD. Fundal height today size less than dates u/s next visit for growth.   Follow up 2 wk  for ROB or sooner if needed.    Doreene Burke, CNM

## 2021-07-08 NOTE — Patient Instructions (Signed)
Breastfeeding and Breast Care It is normal to have some problems when you start to breastfeed your new baby. But there are things that you can do to take care of yourself and help prevent problems. This includes keeping your breasts healthy and making sure that your baby's mouth attaches (latches) properly to your nipple for feedings. Work with your doctor or breastfeeding specialist to find what works best for you. How does self-care benefit me? If you keep your breasts healthy and you let your baby attach to your nipples in the right way, you will avoid these problems: Cracked or sore nipples. Breasts becoming overfilled with milk. Plugged milk ducts. Low milk supply. Breast swelling or infection. How does self-care benefit my baby? By preventing problems with your breasts, you will ensure that your baby will feed well and will gain the right amount of weight. What actions can I take to care for myself during breastfeeding? Best ways to breastfeed Always make sure that your baby latches properly to breastfeed. Make sure that your baby is in a proper position. Try different breastfeeding positions to find one that works best for you and your baby. Breastfeed when you feel like you need to make your breasts less full or when your baby shows signs of hunger. This is called "breastfeeding on demand." Do not delay feedings. Try to relax when it is time to feed your baby. This helps your body release milk from your breast. To help increase milk flow, do these things before feeding: Remove a small amount of milk from your breast. Use a pump or squeeze with your hand. Apply warm, moist heat to your breast. Do this in the shower or use hand towels soaked with warm water. Massage your breasts. Do this when you are breastfeeding as well. Caring for your breasts   To help your breasts stay healthy and keep them from getting too dry: Avoid using soap on your nipples. Let your nipples air-dry for 3-4  minutes after each feeding. Do not use things like a hair dryer to dry your breasts. This can make the skin dry and will cause irritation and pain. Use only cotton bra pads to soak up breast milk that leaks. Change the pads if they become soaked with milk. If you use bra pads that can be thrown away, change them often. Put some lanolin on your nipples after breastfeeding. Pure lanolin does not need to be washed off your nipple before you feed your baby again. Pure lanolin is not harmful to your baby. Rub some breast milk into your nipples: Use your hand to squeeze out a few drops of breast milk. Gently massage the milk into your nipples. Let your nipples air-dry. Wear a supportive nursing bra. Avoid wearing: Tight clothing. Underwire bras or bras that put pressure on your breasts. Use ice to help relieve pain or swelling of your breasts: Put ice in a plastic bag. Place a towel between your skin and the bag. Leave the ice on for 20 minutes, 2-3 times a day. Follow these instructions at home: Drink enough fluid to keep your pee (urine) pale yellow. Get plenty of rest. Sleep when your baby sleeps. Talk to your doctor or breastfeeding specialist before taking any herbal supplements. Eat a balanced diet. This includes fruits, vegetables, whole grains, lean proteins, and dairy or dairy alternatives Contact a health care provider if: You have nipple pain. You have cracking or soreness in your nipples that lasts longer than 1 week. Your breasts are  overfilled with milk, and this lasts longer than 48 hours. You have a fever. You have pus-like fluid coming from your nipple. You have redness, a rash, swelling, itching, or burning on your breast. Your baby does not gain weight. Your baby loses weight. Your baby is not feeding regularly or is very sleepy and lacks energy. Summary There are things that you can do to take care of yourself and help prevent many common breastfeeding problems. Always  make sure that your baby's mouth attaches (latches) to your nipple properly to breastfeed. Keep your nipples from getting too dry, drink plenty of fluid, and get plenty of rest. Feed on demand. Do not delay feedings. This information is not intended to replace advice given to you by your health care provider. Make sure you discuss any questions you have with your health care provider. Document Revised: 02/28/2020 Document Reviewed: 02/28/2020 Elsevier Patient Education  [place].    Influenza (Flu) Vaccine (Inactivated or Recombinant): What You Need to Know 1. Why get vaccinated? Influenza vaccine can prevent influenza (flu). Flu is a contagious disease that spreads around the [place] every year, usually between October and May. Anyone can get the flu, but it is more dangerous for some people. Infants and young children, people 25 years and older, pregnant people, and people with certain health conditions or a weakened immune system are at greatest risk of flu complications. Pneumonia, bronchitis, sinus infections, and ear infections are examples of flu-related complications. If you have a medical condition, such as heart disease, cancer, or diabetes, flu can make it worse. Flu can cause fever and chills, sore throat, muscle aches, fatigue, cough, headache, and runny or stuffy nose. Some people may have vomiting and diarrhea, though this is more common in children than adults. In an average year, thousands of people in the [place] die from flu, and many more are hospitalized. Flu vaccine prevents millions of illnesses and flu-related visits to the doctor each year. 2. Influenza vaccines CDC recommends everyone 6 months and older get vaccinated every flu season. Children 6 months through 44 years of age may need 2 doses during a single flu season. Everyone else needs only 1 dose each flu season. It takes about 2 weeks for protection to develop after vaccination. There are many  flu viruses, and they are always changing. Each year a new flu vaccine is made to protect against the influenza viruses believed to be likely to cause disease in the upcoming flu season. Even when the vaccine doesn't exactly match these viruses, it may still provide some protection. Influenza vaccine does not cause flu. Influenza vaccine may be given at the same time as other vaccines. 3. Talk with your health care provider Tell your vaccination provider if the person getting the vaccine: Has had an allergic reaction after a previous dose of influenza vaccine, or has any severe, life-threatening allergies Has ever had Guillain-Barr Syndrome (also called "GBS") In some cases, your health care provider may decide to postpone influenza vaccination until a future visit. Influenza vaccine can be administered at any time during pregnancy. People who are or will be pregnant during influenza season should receive inactivated influenza vaccine. People with minor illnesses, such as a cold, may be vaccinated. People who are moderately or severely ill should usually wait until they recover before getting influenza vaccine. Your health care provider can give you more information. 4. Risks of a vaccine reaction Soreness, redness, and swelling where the shot is given, fever, muscle  aches, and headache can happen after influenza vaccination. There may be a very small increased risk of Guillain-Barr Syndrome (GBS) after inactivated influenza vaccine (the flu shot). Young children who get the flu shot along with pneumococcal vaccine (PCV13) and/or DTaP vaccine at the same time might be slightly more likely to have a seizure caused by fever. Tell your health care provider if a child who is getting flu vaccine has ever had a seizure. People sometimes faint after medical procedures, including vaccination. Tell your provider if you feel dizzy or have vision changes or ringing in the ears. As with any medicine, there is  a very remote chance of a vaccine causing a severe allergic reaction, other serious injury, or death. 5. What if there is a serious problem? An allergic reaction could occur after the vaccinated person leaves the clinic. If you see signs of a severe allergic reaction (hives, swelling of the face and throat, difficulty breathing, a fast heartbeat, dizziness, or weakness), call 9-1-1 and get the person to the nearest hospital. For other signs that concern you, call your health care provider. Adverse reactions should be reported to the Vaccine Adverse Event Reporting System (VAERS). Your health care provider will usually file this report, or you can do it yourself. Visit the VAERS website at www.vaers.SamedayNews.es or call (417)364-2985. VAERS is only for reporting reactions, and VAERS staff members do not give medical advice. 6. The National Vaccine Injury Compensation Program The [employer] Vaccine Injury Compensation Program (VICP) is a federal program that was created to compensate people who may have been injured by certain vaccines. Claims regarding alleged injury or death due to vaccination have a time limit for filing, which may be as short as two years. Visit the VICP website at GoldCloset.com.ee or call (737) 782-6620 to learn about the program and about filing a claim. 7. How can I learn more? Ask your health care provider. Call your local or state health department. Visit the website of the Food and Drug Administration (FDA) for vaccine package inserts and additional information at TraderRating.uy. Contact the Centers for Disease Control and Prevention (CDC): Call 864-038-2872 (1-800-CDC-INFO) or Visit CDC's website at https://gibson.com/. Vaccine Information Statement Inactivated Influenza Vaccine (04/27/2020) This information is not intended to replace advice given to you by your health care provider. Make sure you discuss any questions you have with your  health care provider. Document Revised: 06/14/2020 Document Reviewed: 06/14/2020 Elsevier Patient Education  [place]. Tdap (Tetanus, Diphtheria, Pertussis) Vaccine: What You Need to Know 1. Why get vaccinated? Tdap vaccine can prevent tetanus, diphtheria, and pertussis. Diphtheria and pertussis spread from person to person. Tetanus enters the body through cuts or wounds. TETANUS (T) causes painful stiffening of the muscles. Tetanus can lead to serious health problems, including being unable to open the mouth, having trouble swallowing and breathing, or death. DIPHTHERIA (D) can lead to difficulty breathing, heart failure, paralysis, or death. PERTUSSIS (aP), also known as "whooping cough," can cause uncontrollable, violent coughing that makes it hard to breathe, eat, or drink. Pertussis can be extremely serious especially in babies and young children, causing pneumonia, convulsions, brain damage, or death. In teens and adults, it can cause weight loss, loss of bladder control, passing out, and rib fractures from severe coughing. 2. Tdap vaccine Tdap is only for children 7 years and older, adolescents, and adults.  Adolescents should receive a single dose of Tdap, preferably at age 26 or 29 years. Pregnant people should get a dose of Tdap during every  pregnancy, preferably during the early part of the third trimester, to help protect the newborn from pertussis. Infants are most at risk for severe, life-threatening complications from pertussis. Adults who have never received Tdap should get a dose of Tdap. Also, adults should receive a booster dose of either Tdap or Td (a different vaccine that protects against tetanus and diphtheria but not pertussis) every 10 years, or after 5 years in the case of a severe or dirty wound or burn. Tdap may be given at the same time as other vaccines. 3. Talk with your health care provider Tell your vaccine provider if the person getting the vaccine: Has  had an allergic reaction after a previous dose of any vaccine that protects against tetanus, diphtheria, or pertussis, or has any severe, life-threatening allergies Has had a coma, decreased level of consciousness, or prolonged seizures within 7 days after a previous dose of any pertussis vaccine (DTP, DTaP, or Tdap) Has seizures or another nervous system problem Has ever had Guillain-Barr Syndrome (also called "GBS") Has had severe pain or swelling after a previous dose of any vaccine that protects against tetanus or diphtheria In some cases, your health care provider may decide to postpone Tdap vaccination until a future visit. People with minor illnesses, such as a cold, may be vaccinated. People who are moderately or severely ill should usually wait until they recover before getting Tdap vaccine.  Your health care provider can give you more information. 4. Risks of a vaccine reaction Pain, redness, or swelling where the shot was given, mild fever, headache, feeling tired, and nausea, vomiting, diarrhea, or stomachache sometimes happen after Tdap vaccination. People sometimes faint after medical procedures, including vaccination. Tell your provider if you feel dizzy or have vision changes or ringing in the ears.  As with any medicine, there is a very remote chance of a vaccine causing a severe allergic reaction, other serious injury, or death. 5. What if there is a serious problem? An allergic reaction could occur after the vaccinated person leaves the clinic. If you see signs of a severe allergic reaction (hives, swelling of the face and throat, difficulty breathing, a fast heartbeat, dizziness, or weakness), call 9-1-1 and get the person to the nearest hospital. For other signs that concern you, call your health care provider.  Adverse reactions should be reported to the Vaccine Adverse Event Reporting System (VAERS). Your health care provider will usually file this report, or you can do it  yourself. Visit the VAERS website at www.vaers.SamedayNews.es or call 7037802214. VAERS is only for reporting reactions, and VAERS staff members do not give medical advice. 6. The National Vaccine Injury Compensation Program The [employer] Vaccine Injury Compensation Program (VICP) is a federal program that was created to compensate people who may have been injured by certain vaccines. Claims regarding alleged injury or death due to vaccination have a time limit for filing, which may be as short as two years. Visit the VICP website at GoldCloset.com.ee or call 782-441-9736 to learn about the program and about filing a claim. 7. How can I learn more? Ask your health care provider. Call your local or state health department. Visit the website of the Food and Drug Administration (FDA) for vaccine package inserts and additional information at TraderRating.uy. Contact the Centers for Disease Control and Prevention (CDC): Call 567-840-3786 (1-800-CDC-INFO) or Visit CDC's website at http://hunter.com/. Vaccine Information Statement Tdap (Tetanus, Diphtheria, Pertussis) Vaccine (04/27/2020) This information is not intended to replace advice given to you by your  health care provider. Make sure you discuss any questions you have with your health care provider. Document Revised: 05/23/2020 Document Reviewed: 05/23/2020 Elsevier Patient Education  [place]. Common Medications Safe in Pregnancy  Acne:      Constipation:  Benzoyl Peroxide     Colace  Clindamycin      Dulcolax Suppository  Topica Erythromycin     Fibercon  Salicylic Acid      Metamucil         Miralax AVOID:        Senakot   Accutane    Cough:  Retin-A       Cough Drops  Tetracycline      Phenergan w/ Codeine if Rx  Minocycline      Robitussin (Plain &  DM)  Antibiotics:     Crabs/Lice:  Ceclor       RID  Cephalosporins    AVOID:  E-Mycins      Kwell  Keflex  Macrobid/Macrodantin   Diarrhea:  Penicillin      Kao-Pectate  Zithromax      Imodium AD         PUSH FLUIDS AVOID:       Cipro     Fever:  Tetracycline      Tylenol (Regular or Extra  Minocycline       Strength)  Levaquin      Extra Strength-Do not          Exceed 8 tabs/24 hrs Caffeine:        '200mg'$ /day (equiv. To 1 cup of coffee or  approx. 3 12 oz sodas)         Gas: Cold/Hayfever:       Gas-X  Benadryl      Mylicon  Claritin       Phazyme  **Claritin-D        Chlor-Trimeton    Headaches:  Dimetapp      ASA-Free Excedrin  Drixoral-Non-Drowsy     Cold Compress  Mucinex (Guaifenasin)     Tylenol (Regular or Extra  Sudafed/Sudafed-12 Hour     Strength)  **Sudafed PE Pseudoephedrine   Tylenol Cold & Sinus     Vicks Vapor Rub  Zyrtec  **AVOID if Problems With Blood Pressure         Heartburn: Avoid lying down for at least 1 hour after meals  Aciphex      Maalox     Rash:  Milk of Magnesia     Benadryl    Mylanta       1% Hydrocortisone Cream  Pepcid  Pepcid Complete   Sleep Aids:  Prevacid      Ambien   Prilosec       Benadryl  Rolaids       Chamomile Tea  Tums (Limit 4/day)     Unisom         Tylenol PM         Warm milk-add vanilla or  Hemorrhoids:       Sugar for taste  Anusol/Anusol H.C.  (RX: Analapram 2.5%)  Sugar Substitutes:  Hydrocortisone OTC     Ok in moderation  Preparation H      Tucks        Vaseline lotion applied to tissue with wiping    Herpes:     Throat:  Acyclovir      Oragel  Famvir  Valtrex     Vaccines:         Flu Shot Leg  Cramps:       *Gardasil  Benadryl      Hepatitis A         Hepatitis B Nasal Spray:       Pneumovax  Saline Nasal Spray     Polio Booster         Tetanus Nausea:       Tuberculosis test or PPD  Vitamin B6 25 mg TID   AVOID:    Dramamine      *Gardasil  Emetrol       Live Poliovirus  Ginger  Root 250 mg QID    MMR (measles, mumps &  High Complex Carbs @ Bedtime    rebella)  Sea Bands-Accupressure    Varicella (Chickenpox)  Unisom 1/2 tab TID     *No known complications           If received before Pain:         Known pregnancy;   Darvocet       Resume series after  Lortab        Delivery  Percocet    Yeast:   Tramadol      Femstat  Tylenol 3      Gyne-lotrimin  Ultram       Monistat  Vicodin           MISC:         All Sunscreens           Hair Coloring/highlights          Insect Repellant's          (Including DEET)         Mystic Tans

## 2021-07-09 ENCOUNTER — Other Ambulatory Visit: Payer: Self-pay | Admitting: Certified Nurse Midwife

## 2021-07-09 LAB — CBC
Hematocrit: 29.9 % — ABNORMAL LOW (ref 34.0–46.6)
Hemoglobin: 10.2 g/dL — ABNORMAL LOW (ref 11.1–15.9)
MCH: 30.4 pg (ref 26.6–33.0)
MCHC: 34.1 g/dL (ref 31.5–35.7)
MCV: 89 fL (ref 79–97)
Platelets: 276 10*3/uL (ref 150–450)
RBC: 3.35 x10E6/uL — ABNORMAL LOW (ref 3.77–5.28)
RDW: 12.1 % (ref 11.7–15.4)
WBC: 7.2 10*3/uL (ref 3.4–10.8)

## 2021-07-09 LAB — GLUCOSE, 1 HOUR GESTATIONAL: Gestational Diabetes Screen: 102 mg/dL (ref 70–139)

## 2021-07-09 LAB — RPR: RPR Ser Ql: NONREACTIVE

## 2021-07-09 MED ORDER — FUSION PLUS PO CAPS: 1.0000 | ORAL_CAPSULE | Freq: Every day | ORAL | 9 refills | Status: DC

## 2021-07-22 ENCOUNTER — Telehealth: Payer: Self-pay | Admitting: Certified Nurse Midwife

## 2021-07-22 ENCOUNTER — Other Ambulatory Visit: Payer: Self-pay

## 2021-07-22 MED ORDER — NITROFURANTOIN MONOHYD MACRO 100 MG PO CAPS: 100.0000 mg | ORAL_CAPSULE | Freq: Every day | ORAL | 4 refills | Status: DC

## 2021-07-22 NOTE — Telephone Encounter (Signed)
Christina Ruiz called in and states she has a UTI.  Yesterday she started experiencing urgency and frequency as well as some discomfort.  Patient wants to know if something can be called in.  Please advise.

## 2021-07-22 NOTE — Telephone Encounter (Signed)
Pt comlpaining of urinary frequency, urgency, discomfort, pain when urinating. Rx sent to preferred pharm.

## 2021-07-25 ENCOUNTER — Ambulatory Visit (INDEPENDENT_AMBULATORY_CARE_PROVIDER_SITE_OTHER)

## 2021-07-25 ENCOUNTER — Other Ambulatory Visit: Payer: Self-pay

## 2021-07-25 DIAGNOSIS — Z3483 Encounter for supervision of other normal pregnancy, third trimester: Secondary | ICD-10-CM | POA: Diagnosis not present

## 2021-07-25 DIAGNOSIS — Z3A28 28 weeks gestation of pregnancy: Secondary | ICD-10-CM

## 2021-07-26 ENCOUNTER — Ambulatory Visit (INDEPENDENT_AMBULATORY_CARE_PROVIDER_SITE_OTHER): Admitting: Certified Nurse Midwife

## 2021-07-26 VITALS — BP 109/75 | HR 91 | Wt 143.7 lb

## 2021-07-26 DIAGNOSIS — Z3A31 31 weeks gestation of pregnancy: Secondary | ICD-10-CM

## 2021-07-26 DIAGNOSIS — Z3483 Encounter for supervision of other normal pregnancy, third trimester: Secondary | ICD-10-CM

## 2021-07-26 LAB — POCT URINALYSIS DIPSTICK OB
Bilirubin, UA: NEGATIVE
Blood, UA: NEGATIVE
Glucose, UA: NEGATIVE
Ketones, UA: NEGATIVE
Leukocytes, UA: NEGATIVE
Nitrite, UA: NEGATIVE
POC,PROTEIN,UA: NEGATIVE
Spec Grav, UA: 1.01 (ref 1.010–1.025)
Urobilinogen, UA: 0.2 E.U./dL
pH, UA: 6.5 (ref 5.0–8.0)

## 2021-07-26 NOTE — Patient Instructions (Signed)
Fetal Positions ?In the final weeks of your pregnancy, your baby usually moves into a head-down (vertex) position to get ready for birth. As a normal delivery proceeds through the stages of labor, the baby tucks in the chin and turns to face your back. In this position, the back of your baby's head starts to show (crown) first through your open cervix. Sometimes your baby may be in a different, abnormal position just before birth. These positions are called malpositions or malpresentations. Giving birth can be more difficult if your baby is in an abnormal position. ?Abnormal fetal positions ?There are five main abnormal fetal positions: ?Occiput posterior presentation. This is the most common abnormal fetal position. It is sometimes called the sunny-side up position because your baby's face points toward your front instead of your back. ?Breech presentation. This is also common. In this position, your baby's bottom or feet are in position to come out first. ?Face or brow presentation. In this position, your baby is head down, but the face or the front of the head crowns first. ?Compound presentation. In this position, your baby's hand or leg comes out along with the head or bottom. ?Transverse presentation. In this position, your baby is lying sideways across your birth canal. Your baby's shoulder may come out first. ?Your health care provider can diagnose an abnormal fetal position during a physical exam as your due date approaches. An abnormal fetal position may be found by feeling your belly and by doing an internal (pelvic) exam. A sound wave imaging study (fetal ultrasound) can be done to confirm the abnormal position. ?What causes an abnormal fetal position? ?In many cases, the cause for an abnormal fetal position is not known. You may be at higher risk of having a baby in an abnormal fetal position if: ?You have an abnormally shaped womb (uterus) or pelvis. ?You have growths in your uterus, such as  fibroids. ?Your placenta is large or in an abnormal position. ?You are having twins or multiples. ?You have too much or too little amniotic fluid. ?Your baby has some type of developmental abnormality. ?You go into early (premature) labor. ?How does this affect me? ?In some cases, your baby may abruptly move into a vertex position just before or during labor. However, an abnormal fetal position increases your risk for a long labor or the need for steps to be taken to help ensure a safe delivery. Your health care provider may need to: ?Turn the baby manually by pushing on your belly (external cephalic version). ?Use instruments, such as forceps or a suctioning device, to help get your baby through the birth canal (assisted delivery). ?Deliver your baby by cesarean delivery, also called a C-section. ?The exact effects on your delivery will depend on the position your baby is in right before birth. ?If your baby has an occiput posterior presentation: ?You may have to push longer and may have a longer labor. ?You may have more back pain. ?You may deliver vaginally, but you may also need an assisted delivery using forceps or vacuum device. ?You may need a cesarean delivery. ?If your baby is breech: ?Your health care provider may try a vaginal delivery. However, in most cases your health care provider will recommend a cesarean delivery to safely deliver your baby. ?If your baby is in a face or brow presentation: ?Your labor may be longer. ?You may be able to have a vaginal or assisted vaginal delivery. ?There is a higher-than-normal risk that you will need a cesarean delivery. ?  If your baby is in a compound presentation: Your health care provider may be able to change your baby's position manually. In most cases, this position requires a cesarean delivery. If your baby is in a transverse presentation: Your health care provider may be able to turn your baby manually. In most cases, this position requires a cesarean  delivery. How does this affect my baby? Most babies are not affected by an abnormal fetal position, but there is a higher risk of some complications, including: Swelling and bruising. Birth injuries. Not getting enough oxygen during birth. Summary The normal fetal position for birth is head down and facing toward your back (vertex position). Abnormal fetal positions include occiput posterior, breech, face or brow presentation, compound presentation, and transverse position. In some cases, your baby may abruptly move into a normal position before birth, or your health care provider may be able to change your baby's position manually. If your baby is in an abnormal fetal position at the time of birth, you have a greater risk of a longer labor, assisted delivery, or a cesarean delivery. This information is not intended to replace advice given to you by your health care provider. Make sure you discuss any questions you have with your health care provider. Document Revised: 06/25/2020 Document Reviewed: 06/25/2020 Elsevier Patient Education  2022 [employer].

## 2021-07-26 NOTE — Progress Notes (Signed)
ROB , doing well. Feeling good movement. U/s completed yesterday for growth. Results reviewed. Discussed breech presentation , use of moxibustion and spinning babies. Information sheets given. Follow up 2 wk.   Doreene Burke, CNM    ULTRASOUND REPORT  Location: [facility] Date of Service: 07/25/2021   Indications:fundal height does not correlate to gestational age Findings:  Mason Jim intrauterine pregnancy is visualized with FHR at 143 BPM.   Biometrics give an (U/S) Gestational age of [redacted]w[redacted]d and an (U/S) EDD of 09/23/21; this correlates with the clinically established Estimated Date of Delivery: 09/24/21.   Fetal presentation is Breech.  Placenta: fundal. Grade: 1 AFI: 12.3 cm  Growth percentile is 41.  AC percentile is 82. EFW: 1658g / 3lb10oz  Impression: 1. [redacted]w[redacted]d Viable Singleton Intrauterine pregnancy previously established criteria. 2. Growth is 41 %ile.  AFI is 12.3 cm.    Recommendations: 1.Clinical correlation with the patient's History and Physical Exam.  Sheralyn Boatman  [place]

## 2021-08-07 ENCOUNTER — Encounter: Payer: Self-pay | Admitting: Certified Nurse Midwife

## 2021-08-07 ENCOUNTER — Other Ambulatory Visit: Payer: Self-pay

## 2021-08-07 ENCOUNTER — Ambulatory Visit (INDEPENDENT_AMBULATORY_CARE_PROVIDER_SITE_OTHER): Admitting: Certified Nurse Midwife

## 2021-08-07 VITALS — BP 106/72 | HR 116 | Wt 146.1 lb

## 2021-08-07 DIAGNOSIS — Z3A33 33 weeks gestation of pregnancy: Secondary | ICD-10-CM

## 2021-08-07 DIAGNOSIS — Z3483 Encounter for supervision of other normal pregnancy, third trimester: Secondary | ICD-10-CM

## 2021-08-07 LAB — POCT URINALYSIS DIPSTICK OB
Bilirubin, UA: NEGATIVE
Blood, UA: NEGATIVE
Glucose, UA: NEGATIVE
Ketones, UA: NEGATIVE
Leukocytes, UA: NEGATIVE
Nitrite, UA: NEGATIVE
POC,PROTEIN,UA: NEGATIVE
Spec Grav, UA: 1.01 (ref 1.010–1.025)
Urobilinogen, UA: 0.2 E.U./dL
pH, UA: 7 (ref 5.0–8.0)

## 2021-08-07 NOTE — Progress Notes (Signed)
ROB doing well, feeling good movement. Discussed spinning babies and use of moxibustion. She is unable to do the inversion due to her furniture being to high off ground. She has done the moxibustion x 1.  Reviewed GBS testing @ 36 wks. She verbalizes and agrees . Leopolds suggests vertex position.  She has increasing back discomfort and pelvic pressure. Reviewed PTL precaution discussed going to hospital for evaluation should irritability progress to PTL. Discussed use of nifedipine and steroids should it be necessary. She verbalizes and agrees. Briefly discussed cephalic version should baby not turn. Follow up 2 wks or prn.   Doreene Burke, CNM

## 2021-08-07 NOTE — Patient Instructions (Signed)
Christina Ruiz Patient Name: ________________________________________________ Patient Due Date: ____________________ What is a Christina movement count? A Christina movement count is the number of times that you feel your baby move during a certain amount of time. This may also be called a Christina kick count. A Christina movement count is recommended for every pregnant woman. You may be asked to start counting Christina movements as early as week 28 of your pregnancy. Pay attention to when your baby is most active. You may notice your baby's sleep and wake cycles. You may also notice things that make your baby move more. You should do a Christina movement count: When your baby is normally most active. At the same time each day. A good time to count movements is while you are resting, after having something to eat and drink. How do I count Christina movements? Find a quiet, comfortable area. Sit, or lie down on your side. Write down the date, the start time and stop time, and the number of movements that you felt between those two times. Take this information with you to your health care visits. Write down your start time when you feel the first movement. Count kicks, flutters, swishes, rolls, and jabs. You should feel at least 10 movements. You may stop counting after you have felt 10 movements, or if you have been counting for 2 hours. Write down the stop time. If you do not feel 10 movements in 2 hours, contact your health care provider for further instructions. Your health care provider may want to do additional tests to assess your baby's well-being. Contact a health care provider if: You feel fewer than 10 movements in 2 hours. Your baby is not moving like he or she usually does. Date: ____________ Start time: ____________ Stop time: ____________ Movements: ____________ Date: ____________ Start time: ____________ Stop time: ____________ Movements: ____________ Date: ____________ Start time: ____________ Stop  time: ____________ Movements: ____________ Date: ____________ Start time: ____________ Stop time: ____________ Movements: ____________ Date: ____________ Start time: ____________ Stop time: ____________ Movements: ____________ Date: ____________ Start time: ____________ Stop time: ____________ Movements: ____________ Date: ____________ Start time: ____________ Stop time: ____________ Movements: ____________ Date: ____________ Start time: ____________ Stop time: ____________ Movements: ____________ Date: ____________ Start time: ____________ Stop time: ____________ Movements: ____________ This information is not intended to replace advice given to you by your health care provider. Make sure you discuss any questions you have with your health care provider. Document Revised: 04/28/2019 Document Reviewed: 04/28/2019 Elsevier Patient Education  2022 [employer] Group B Streptococcus Infection During Pregnancy Group B Streptococcus (GBS) is a type of bacteria that is often found in healthy people. It is commonly found in the rectum, vagina, and intestines. In people who are healthy and not pregnant, the bacteria rarely cause serious illness or complications. However, women who test positive for GBS during pregnancy can pass the bacteria to the baby during childbirth. This can cause serious infection in the baby after birth. Women with GBS may also have infections during their pregnancy or soon after childbirth. The infections include urinary tract infections (UTIs) or infections of the uterus. GBS also increases a woman's risk of complications during pregnancy, such as early labor or delivery, miscarriage, or stillbirth. Routine testing for GBS is recommended for all pregnant women. What are the causes? This condition is caused by bacteria called Streptococcus agalactiae. What increases the risk? You may have a higher risk for GBS infection during pregnancy if you had one during a past pregnancy. What  are the signs or symptoms? In most cases, GBS infection does not cause symptoms in pregnant women. If symptoms exist, they may include: Labor that starts before the 37th week of pregnancy. A UTI or bladder infection. This may cause a fever, frequent urination, or pain and burning during urination. Fever during labor. There can also be a rapid heartbeat in the mother or baby. Rare but serious symptoms of a GBS infection in women include: Blood infection (septicemia). This may cause fever, chills, or confusion. Lung infection (pneumonia). This may cause fever, chills, cough, rapid breathing, chest pain, or difficulty breathing. Bone, joint, skin, or soft tissue infection. How is this diagnosed? You may be screened for GBS between week 35 and week 37 of pregnancy. If you have symptoms of preterm labor, you may be screened earlier. This condition is diagnosed based on lab test results from: A swab of fluid from the vagina and rectum. A urine sample. How is this treated? This condition is treated with antibiotic medicine. Antibiotic medicine may be given: To you when you go into labor, or as soon as your water breaks. The medicines will continue until after you give birth. If you are having a cesarean delivery, you do not need antibiotics unless your water has broken. To your baby, if he or she requires treatment. Your health care provider will check your baby to decide if he or she needs antibiotics to prevent a serious infection. Follow these instructions at home: Take over-the-counter and prescription medicines only as told by your health care provider. Take your antibiotic medicine as told by your health care provider. Do not stop taking the antibiotic even if you start to feel better. Keep all pre-birth (prenatal) visits and follow-up visits as told by your health care provider. This is important. Contact a health care provider if: You have pain or burning when you urinate. You have to urinate  more often than usual. You have a fever or chills. You develop a bad-smelling vaginal discharge. Get help right away if: Your water breaks. You go into labor. You have severe pain in your abdomen. You have difficulty breathing. You have chest pain. These symptoms may represent a serious problem that is an emergency. Do not wait to see if the symptoms will go away. Get medical help right away. Call your local emergency services (911 in the [place] Do not drive yourself to the hospital. Summary GBS is a type of bacteria that is common in healthy people. During pregnancy, colonization with GBS can cause serious complications for you or your baby. Your health care provider will screen you between 35 and 37 weeks of pregnancy to determine if you are colonized with GBS. If you are colonized with GBS during pregnancy, your health care provider will recommend antibiotics through an IV during labor. After delivery, your baby will be evaluated for complications related to potential GBS infection and may require antibiotics to prevent a serious infection. This information is not intended to replace advice given to you by your health care provider. Make sure you discuss any questions you have with your health care provider. Document Revised: 07/10/2020 Document Reviewed: 04/04/2019 Elsevier Patient Education  2022 [employer].

## 2021-08-12 ENCOUNTER — Other Ambulatory Visit: Payer: Self-pay | Admitting: Certified Nurse Midwife

## 2021-08-12 ENCOUNTER — Encounter: Payer: Self-pay | Admitting: Certified Nurse Midwife

## 2021-08-12 DIAGNOSIS — R519 Headache, unspecified: Secondary | ICD-10-CM

## 2021-08-12 DIAGNOSIS — O26893 Other specified pregnancy related conditions, third trimester: Secondary | ICD-10-CM

## 2021-08-14 ENCOUNTER — Ambulatory Visit (INDEPENDENT_AMBULATORY_CARE_PROVIDER_SITE_OTHER): Admitting: Certified Nurse Midwife

## 2021-08-14 ENCOUNTER — Other Ambulatory Visit: Payer: Self-pay

## 2021-08-14 VITALS — BP 116/80 | HR 82 | Wt 147.1 lb

## 2021-08-14 DIAGNOSIS — R519 Headache, unspecified: Secondary | ICD-10-CM

## 2021-08-14 DIAGNOSIS — Z3483 Encounter for supervision of other normal pregnancy, third trimester: Secondary | ICD-10-CM

## 2021-08-14 DIAGNOSIS — O26893 Other specified pregnancy related conditions, third trimester: Secondary | ICD-10-CM

## 2021-08-14 NOTE — Progress Notes (Signed)
Not seen by provider, nurse visit for BP check and labs collected. Will follow up with  results.   Doreene Burke, CNM

## 2021-08-15 ENCOUNTER — Encounter: Payer: Self-pay | Admitting: Certified Nurse Midwife

## 2021-08-15 LAB — COMPREHENSIVE METABOLIC PANEL
ALT: 31 IU/L (ref 0–32)
AST: 29 IU/L (ref 0–40)
Albumin/Globulin Ratio: 1.4 (ref 1.2–2.2)
Albumin: 3.5 g/dL — ABNORMAL LOW (ref 3.9–5.0)
Alkaline Phosphatase: 82 IU/L (ref 44–121)
BUN/Creatinine Ratio: 9 (ref 9–23)
BUN: 6 mg/dL (ref 6–20)
Bilirubin Total: 0.5 mg/dL (ref 0.0–1.2)
CO2: 20 mmol/L (ref 20–29)
Calcium: 7.8 mg/dL — ABNORMAL LOW (ref 8.7–10.2)
Chloride: 104 mmol/L (ref 96–106)
Creatinine, Ser: 0.64 mg/dL (ref 0.57–1.00)
Globulin, Total: 2.5 g/dL (ref 1.5–4.5)
Glucose: 60 mg/dL — ABNORMAL LOW (ref 70–99)
Potassium: 3.9 mmol/L (ref 3.5–5.2)
Sodium: 137 mmol/L (ref 134–144)
Total Protein: 6 g/dL (ref 6.0–8.5)
eGFR: 123 mL/min/{1.73_m2} (ref >59)

## 2021-08-15 LAB — CBC
Hematocrit: 30.4 % — ABNORMAL LOW (ref 34.0–46.6)
Hemoglobin: 10.1 g/dL — ABNORMAL LOW (ref 11.1–15.9)
MCH: 28.5 pg (ref 26.6–33.0)
MCHC: 33.2 g/dL (ref 31.5–35.7)
MCV: 86 fL (ref 79–97)
Platelets: 279 10*3/uL (ref 150–450)
RBC: 3.54 x10E6/uL — ABNORMAL LOW (ref 3.77–5.28)
RDW: 11.7 % (ref 11.7–15.4)
WBC: 8.1 10*3/uL (ref 3.4–10.8)

## 2021-08-15 LAB — PROTEIN / CREATININE RATIO, URINE
Creatinine, Urine: 72.8 mg/dL
Protein, Ur: 13.7 mg/dL
Protein/Creat Ratio: 188 mg/g creat (ref 0–200)

## 2021-08-19 ENCOUNTER — Other Ambulatory Visit: Payer: Self-pay

## 2021-08-19 ENCOUNTER — Encounter: Payer: Self-pay | Admitting: Obstetrics and Gynecology

## 2021-08-19 ENCOUNTER — Observation Stay
Admission: EM | Admit: 2021-08-19 | Discharge: 2021-08-19 | Disposition: A | Discharge status: Home / Self Care | Attending: Certified Nurse Midwife | Admitting: Certified Nurse Midwife

## 2021-08-19 DIAGNOSIS — O4703 False labor before 37 completed weeks of gestation, third trimester: Secondary | ICD-10-CM | POA: Diagnosis not present

## 2021-08-19 DIAGNOSIS — Z3A34 34 weeks gestation of pregnancy: Secondary | ICD-10-CM | POA: Insufficient documentation

## 2021-08-19 DIAGNOSIS — O26893 Other specified pregnancy related conditions, third trimester: Secondary | ICD-10-CM | POA: Diagnosis not present

## 2021-08-19 DIAGNOSIS — R109 Unspecified abdominal pain: Secondary | ICD-10-CM

## 2021-08-19 LAB — FETAL FIBRONECTIN: Fetal Fibronectin: NEGATIVE

## 2021-08-19 NOTE — Progress Notes (Signed)
CNM reviewed discharge instructions with patient. RN reviewed instructions, patient verbalizes understanding. Patient ambulatory home with significant other in good condition home.

## 2021-08-19 NOTE — OB Triage Note (Signed)
Patient presents with complaints of contractions since 8am. Patient is G4P2, [redacted]w[redacted]d. Patient states that she has not had any bleeding or LOF. +FM. VSS. Monitors applied and assessing.

## 2021-08-19 NOTE — OB Triage Note (Signed)
    L&D OB Triage Note  SUBJECTIVE Christina Ruiz is a 30 y.o. [id number] female at [redacted]w[redacted]d, EDD Estimated Date of Delivery: 09/24/21 who presented to triage with complaints of pressure and contractions. She denies LOF and vaginal bleeding. Feels good movement.   OB History  Gravida Para Term Preterm AB Living  4 2 2  0 1 2  SAB IAB Ectopic Multiple Live Births  1 0 0 1 2    # Outcome Date GA Lbr Len/2nd Weight Sex Delivery Anes PTL Lv  4A Gravida           4B Current           3 Term 06/06/20 [redacted]w[redacted]d 05:10 / 00:09 2720 g F Vag-Spont EPI  LIV     Name: SAMIAH, RICKLEFS Tiffine     Apgar1: 8  Apgar5: 9  2 SAB 2020          1 Term 02/25/18 [redacted]w[redacted]d / 01:08 3572 g M Vag-Spont EPI  LIV     Birth Comments: none     Name: Klump,BOY Rikita     Apgar1: 8  Apgar5: 9    Medications Prior to Admission  Medication Sig Dispense Refill Last Dose   famotidine (PEPCID) 20 MG tablet Take 1 tablet (20 mg total) by mouth 2 (two) times daily. (Patient not taking: Reported on 08/19/2021) 60 tablet 3 Not Taking   Iron-FA-B Cmp-C-Biot-Probiotic (FUSION PLUS) CAPS Take 1 tablet by mouth daily. (Patient not taking: Reported on 08/19/2021) 30 capsule 9 Not Taking   nitrofurantoin, macrocrystal-monohydrate, (MACROBID) 100 MG capsule Take 1 capsule (100 mg total) by mouth daily. (Patient not taking: Reported on 08/19/2021) 30 capsule 4 Not Taking   Prenat-FeFmCb-DSS-FA-DHA w/o A (CITRANATAL HARMONY) 27-1-260 MG CAPS Take 1 tablet by mouth daily. (Patient not taking: Reported on 08/19/2021) 30 capsule 11 Not Taking     OBJECTIVE  Nursing Evaluation:   BP 106/69 (BP Location: Left Arm)   Pulse 81   Temp 98.6 F (37 C) (Oral)   Resp 18   Ht 5\' 3"  (1.6 m)   Wt 66.7 kg   LMP  (LMP Unknown)   BMI 26.05 kg/m    Findings:        Irritability with occasional ctx.       NST was performed and has been reviewed by me.  NST INTERPRETATION: Category I  Mode: External Baseline Rate (A): 125 bpm Variability:  Moderate Accelerations: 15 x 15 Decelerations: None     Contraction Frequency (min): UI  ASSESSMENT Impression:  1.  Pregnancy:  08/21/2021 at [redacted]w[redacted]d , EDD Estimated Date of Delivery: 09/24/21 2.  Reassuring fetal and maternal status 3.  SVE: FT/thick/high, FFN negative , not in PTL   PLAN 1. Current condition and above findings reviewed.  Reassuring fetal and maternal condition. 2. Discharge home with standard labor precautions given to return to L&D or call the office for problems. 3. Continue routine prenatal care as scheduled on Monday 08/26/21  Tuesday, CNM

## 2021-08-26 ENCOUNTER — Ambulatory Visit (INDEPENDENT_AMBULATORY_CARE_PROVIDER_SITE_OTHER): Admitting: Certified Nurse Midwife

## 2021-08-26 ENCOUNTER — Encounter: Payer: Self-pay | Admitting: Certified Nurse Midwife

## 2021-08-26 VITALS — BP 128/78 | Wt 151.9 lb

## 2021-08-26 DIAGNOSIS — Z113 Encounter for screening for infections with a predominantly sexual mode of transmission: Secondary | ICD-10-CM

## 2021-08-26 DIAGNOSIS — R102 Pelvic and perineal pain: Secondary | ICD-10-CM

## 2021-08-26 DIAGNOSIS — Z3A36 36 weeks gestation of pregnancy: Secondary | ICD-10-CM

## 2021-08-26 DIAGNOSIS — R262 Difficulty in walking, not elsewhere classified: Secondary | ICD-10-CM

## 2021-08-26 DIAGNOSIS — Z3483 Encounter for supervision of other normal pregnancy, third trimester: Secondary | ICD-10-CM

## 2021-08-26 DIAGNOSIS — O26893 Other specified pregnancy related conditions, third trimester: Secondary | ICD-10-CM

## 2021-08-26 LAB — POCT URINALYSIS DIPSTICK OB
Bilirubin, UA: NEGATIVE
Blood, UA: NEGATIVE
Glucose, UA: NEGATIVE
Ketones, UA: NEGATIVE
Leukocytes, UA: NEGATIVE
Nitrite, UA: NEGATIVE
POC,PROTEIN,UA: NEGATIVE
Spec Grav, UA: 1.015 (ref 1.010–1.025)
Urobilinogen, UA: 0.2 E.U./dL
pH, UA: 7.5 (ref 5.0–8.0)

## 2021-08-26 NOTE — Progress Notes (Signed)
ROB doing well, feels good movement. Has lots of pelvic pressure and pain. SVE per pt request, has history del 37 wks. 1-2/60/-2. Reassurance given. Discussed PT for difficulty ambulating and standing. Orders placed. She will follow up 1 wk with Dr. Logan Bores ( out of town). Labor precautions reviewed.   Doreene Burke, CNM

## 2021-08-26 NOTE — Patient Instructions (Signed)

## 2021-08-27 ENCOUNTER — Ambulatory Visit (INDEPENDENT_AMBULATORY_CARE_PROVIDER_SITE_OTHER): Admitting: Certified Nurse Midwife

## 2021-08-27 ENCOUNTER — Other Ambulatory Visit: Payer: Self-pay

## 2021-08-27 ENCOUNTER — Encounter: Payer: Self-pay | Admitting: Certified Nurse Midwife

## 2021-08-27 DIAGNOSIS — O4700 False labor before 37 completed weeks of gestation, unspecified trimester: Secondary | ICD-10-CM | POA: Diagnosis not present

## 2021-08-27 MED ORDER — PROMETHAZINE HCL 12.5 MG PO TABS: 12.5000 mg | ORAL_TABLET | Freq: Four times a day (QID) | ORAL | 0 refills | Status: DC | PRN

## 2021-08-27 NOTE — Progress Notes (Signed)
PT presents today for pressure , pain , and contractions. States she woke up with Nausea and vomiting this morning. Is feeling good movement, denies loss of fluid, vaginal bleeding.   NST for evaluation of contractions.   EHM:[id number]  Baseline: 120's Moderate variability Accelerations present Decelerations absent  Ctx: occasional with irritability   SVE per pt request. No significant change in cervix  2/60/-2.  Orders for phenergan placed

## 2021-08-27 NOTE — Patient Instructions (Signed)
Braxton Hicks Contractions Contractions of the uterus can occur throughout pregnancy, but they are not always a sign that you are in labor. You may have practice contractions called Braxton Hicks contractions. These false labor contractions are sometimes confused with true labor. What are Braxton Hicks contractions? Braxton Hicks contractions are tightening movements that occur in the muscles of the uterus before labor. Unlike true labor contractions, these contractions do not result in opening (dilation) and thinning of the lowest part of the uterus (cervix). Toward the end of pregnancy (32-34 weeks), Braxton Hicks contractions can happen more often and may become stronger. These contractions are sometimes difficult to tell apart from true labor because they can be very uncomfortable. How to tell the difference between true labor and false labor True labor Contractions last 30-70 seconds. Contractions become very regular. Discomfort is usually felt in the top of the uterus, and it spreads to the lower abdomen and low back. Contractions do not go away with walking. Contractions usually become stronger and more frequent. The cervix dilates and gets thinner. False labor Contractions are usually shorter, weaker, and farther apart than true labor contractions. Contractions are usually irregular. Contractions are often felt in the front of the lower abdomen and in the groin. Contractions may go away when you walk around or change positions while lying down. The cervix usually does not dilate or become thin. Sometimes, the only way to tell if you are in true labor is for your health care provider to look for changes in your cervix. Your health care provider will do a physical exam and may monitor your contractions. If you are in true labor, your health care provider will send you home with instructions about when to return to the hospital. You may continue to have Braxton Hicks contractions until you  go into true labor. Follow these instructions at home:  Take over-the-counter and prescription medicines only as told by your health care provider. If Braxton Hicks contractions are making you uncomfortable: Change your position from lying down or resting to walking, or change from walking to resting. Sit and rest in a tub of warm water. Drink enough fluid to keep your urine pale yellow. Dehydration may cause these contractions. Do slow and deep breathing several times an hour. Keep all follow-up visits. This is important. Contact a health care provider if: You have a fever. You have continuous pain in your abdomen. Your contractions become stronger, more regular, and closer together. You pass blood-tinged mucus. Get help right away if: You have fluid leaking or gushing from your vagina. You have bright red blood coming from your vagina. Your baby is not moving inside you as much as it used to. Summary You may have practice contractions called Braxton Hicks contractions. These false labor contractions are sometimes confused with true labor. Braxton Hicks contractions are usually shorter, weaker, farther apart, and less regular than true labor contractions. True labor contractions usually become stronger, more regular, and more frequent. Manage discomfort from Braxton Hicks contractions by changing position, resting in a warm bath, practicing deep breathing, and drinking plenty of water. Keep all follow-up visits. Contact your health care provider if your contractions become stronger, more regular, and closer together. This information is not intended to replace advice given to you by your health care provider. Make sure you discuss any questions you have with your health care provider. Document Revised: 07/16/2020 Document Reviewed: 07/16/2020 Elsevier Patient Education  2022 Elsevier Inc.  

## 2021-08-28 LAB — GC/CHLAMYDIA PROBE AMP
Chlamydia trachomatis, NAA: NEGATIVE
Neisseria Gonorrhoeae by PCR: NEGATIVE

## 2021-08-28 LAB — STREP GP B NAA: Strep Gp B NAA: POSITIVE — AB

## 2021-08-31 ENCOUNTER — Other Ambulatory Visit: Payer: Self-pay

## 2021-08-31 ENCOUNTER — Observation Stay
Admission: EM | Admit: 2021-08-31 | Discharge: 2021-08-31 | Disposition: A | Discharge status: Home / Self Care | Attending: Obstetrics and Gynecology | Admitting: Obstetrics and Gynecology

## 2021-08-31 ENCOUNTER — Encounter: Payer: Self-pay | Admitting: Obstetrics and Gynecology

## 2021-08-31 DIAGNOSIS — O26893 Other specified pregnancy related conditions, third trimester: Secondary | ICD-10-CM

## 2021-08-31 DIAGNOSIS — Z3A36 36 weeks gestation of pregnancy: Secondary | ICD-10-CM | POA: Diagnosis not present

## 2021-08-31 DIAGNOSIS — R109 Unspecified abdominal pain: Secondary | ICD-10-CM

## 2021-08-31 DIAGNOSIS — O4703 False labor before 37 completed weeks of gestation, third trimester: Secondary | ICD-10-CM | POA: Diagnosis not present

## 2021-08-31 DIAGNOSIS — Z3490 Encounter for supervision of normal pregnancy, unspecified, unspecified trimester: Secondary | ICD-10-CM

## 2021-08-31 NOTE — Progress Notes (Signed)
Discharge instructions provided to pt. Pt verbalizes understanding. Vaginal bleeding and discharge, contractions, and fetal movement reviewed by RN. Follow-up care reviewed. Pt discharged home with significant other.  

## 2021-08-31 NOTE — OB Triage Note (Signed)
Pt is a 30 yo G4P2, 36w 4d. GBS+. She arrived to the unit with complaints of  contractions every 2-35mins starting at 1730 rating them 6/10 on pain scale and radiating leg pain. She denies vaginal bleeding, reports positive fetal movement. VS stable, monitors applied and assessing.   Initial FHT 140 at 1939.

## 2021-09-02 ENCOUNTER — Encounter: Payer: Self-pay | Admitting: Obstetrics and Gynecology

## 2021-09-02 ENCOUNTER — Other Ambulatory Visit: Payer: Self-pay

## 2021-09-02 ENCOUNTER — Telehealth: Payer: Self-pay | Admitting: Certified Nurse Midwife

## 2021-09-02 ENCOUNTER — Observation Stay
Admission: EM | Admit: 2021-09-02 | Discharge: 2021-09-02 | Disposition: A | Discharge status: Home / Self Care | Attending: Obstetrics and Gynecology | Admitting: Obstetrics and Gynecology

## 2021-09-02 DIAGNOSIS — O26893 Other specified pregnancy related conditions, third trimester: Secondary | ICD-10-CM | POA: Diagnosis present

## 2021-09-02 DIAGNOSIS — Z3A36 36 weeks gestation of pregnancy: Secondary | ICD-10-CM | POA: Diagnosis not present

## 2021-09-02 DIAGNOSIS — N898 Other specified noninflammatory disorders of vagina: Secondary | ICD-10-CM | POA: Insufficient documentation

## 2021-09-02 LAB — WET PREP, GENITAL
Clue Cells Wet Prep HPF POC: NONE SEEN
Sperm: NONE SEEN
Trich, Wet Prep: NONE SEEN
WBC, Wet Prep HPF POC: >10 — AB (ref <10)
Yeast Wet Prep HPF POC: NONE SEEN

## 2021-09-02 LAB — RUPTURE OF MEMBRANE (ROM)PLUS: Rom Plus: NEGATIVE

## 2021-09-02 NOTE — Progress Notes (Signed)
Reviewed discharge instructions with patient. Patient verbalizes understanding. Patient ambulatory discharged home in good condition with significant other.

## 2021-09-02 NOTE — Discharge Summary (Signed)
    L&D OB Triage Note  SUBJECTIVE Christina Ruiz is a 30 y.o. [id number] female at [redacted]w[redacted]d, EDD Estimated Date of Delivery: 09/24/21 who presented to triage with complaints of contractions every 2-75mins starting at 1730 rating them 6/10 on pain scale and radiating leg pain. She denies vaginal bleeding or discharge, reports fetal movement.  OB History  Gravida Para Term Preterm AB Living  4 2 2  0 1 2  SAB IAB Ectopic Multiple Live Births  1 0 0 1 2    # Outcome Date GA Lbr Len/2nd Weight Sex Delivery Anes PTL Lv  4A Gravida           4B Current           3 Term 06/06/20 [redacted]w[redacted]d 05:10 / 00:09 2720 g F Vag-Spont EPI  LIV     Name: Christina Ruiz, Christina Ruiz     Apgar1: 8  Apgar5: 9  2 SAB 2020          1 Term 02/25/18 [redacted]w[redacted]d / 01:08 3572 g M Vag-Spont EPI  LIV     Birth Comments: none     Name: Vallone,BOY Lawson     Apgar1: 8  Apgar5: 9    Medications Prior to Admission  Medication Sig Dispense Refill Last Dose   famotidine (PEPCID) 20 MG tablet Take 1 tablet (20 mg total) by mouth 2 (two) times daily. (Patient not taking: Reported on 08/31/2021) 60 tablet 3 Not Taking   Iron-FA-B Cmp-C-Biot-Probiotic (FUSION PLUS) CAPS Take 1 tablet by mouth daily. (Patient not taking: Reported on 08/31/2021) 30 capsule 9 Not Taking   nitrofurantoin, macrocrystal-monohydrate, (MACROBID) 100 MG capsule Take 1 capsule (100 mg total) by mouth daily. (Patient not taking: Reported on 08/31/2021) 30 capsule 4 Not Taking   Prenat-FeFmCb-DSS-FA-DHA w/o A (CITRANATAL HARMONY) 27-1-260 MG CAPS Take 1 tablet by mouth daily. (Patient not taking: Reported on 08/31/2021) 30 capsule 11 Not Taking   promethazine (PHENERGAN) 12.5 MG tablet Take 1 tablet (12.5 mg total) by mouth every 6 (six) hours as needed for nausea or vomiting. 30 tablet 0 Past Week     OBJECTIVE  Nursing Evaluation:   Pulse 85   Temp 98.4 F (36.9 C) (Oral)   Resp 16   Ht 5\' 3"  (1.6 m)   Wt 67.1 kg   LMP  (LMP Unknown)   BMI 26.22 kg/m    Findings:         Irregular contractions/irritability noted without cervical change.  Patient not in labor.      NST was performed and has been reviewed by me.  NST INTERPRETATION: Category I  Mode: External Baseline Rate (A): 135 bpm Variability: Moderate Accelerations: 15 x 15 Decelerations: None     Contraction Frequency (min): 2-6 with ui  ASSESSMENT Impression:  1.  Pregnancy:  14/06/2021 at [redacted]w[redacted]d , EDD Estimated Date of Delivery: 09/24/21 2.  Reassuring fetal and maternal status  PLAN 1. Current condition and above findings reviewed.  Reassuring fetal and maternal condition. 2. Discharge home with standard labor precautions given to return to L&D or call the office for problems. 3. Continue routine prenatal care.

## 2021-09-02 NOTE — Telephone Encounter (Signed)
Spoke with patient and advised that she go ahead and go to L&D just to be on the safe side. She stated that leakage was more than a dribble and she was wearing a depends and it is soaking the depends. Denies contractions and pain.

## 2021-09-02 NOTE — OB Triage Note (Signed)
Patient is G4P2, [redacted]w[redacted]d that presents with complaint of LOF. Patient states that since 1230p  today she has had a "trickle" of fluid and "put on a depends and has had to change it". The fluid is a light brown/yellow-ish color. Patient denies bleeding. Patient states +FM. Patient feels some cramping/tightening of her belly from time to time, but no ctx. Patient did complain of a sharp discomfort in her right, pelvic region. VSS. Monitors applied and assessing.

## 2021-09-02 NOTE — Telephone Encounter (Signed)
Pt called in stating that she has been leaking a clear/brown tint fluid, she is having to wear a depends and it is soaking. Pt denies any bleeding or contractions. Pt has concerns.

## 2021-09-02 NOTE — Progress Notes (Signed)
Provider reviewed strip. Okay to remove monitors.

## 2021-09-03 ENCOUNTER — Encounter: Admitting: Obstetrics and Gynecology

## 2021-09-03 DIAGNOSIS — Z3A37 37 weeks gestation of pregnancy: Secondary | ICD-10-CM

## 2021-09-03 DIAGNOSIS — Z3483 Encounter for supervision of other normal pregnancy, third trimester: Secondary | ICD-10-CM
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
## 2021-09-05 ENCOUNTER — Other Ambulatory Visit: Payer: Self-pay

## 2021-09-05 ENCOUNTER — Encounter: Payer: Self-pay | Admitting: Obstetrics and Gynecology

## 2021-09-05 ENCOUNTER — Observation Stay
Admission: EM | Admit: 2021-09-05 | Discharge: 2021-09-05 | Disposition: A | Discharge status: Home / Self Care | Attending: Obstetrics and Gynecology | Admitting: Obstetrics and Gynecology

## 2021-09-05 ENCOUNTER — Telehealth: Payer: Self-pay | Admitting: Obstetrics and Gynecology

## 2021-09-05 DIAGNOSIS — O09293 Supervision of pregnancy with other poor reproductive or obstetric history, third trimester: Secondary | ICD-10-CM

## 2021-09-05 DIAGNOSIS — Z3A37 37 weeks gestation of pregnancy: Secondary | ICD-10-CM

## 2021-09-05 DIAGNOSIS — M549 Dorsalgia, unspecified: Secondary | ICD-10-CM

## 2021-09-05 DIAGNOSIS — O26893 Other specified pregnancy related conditions, third trimester: Secondary | ICD-10-CM

## 2021-09-05 MED ORDER — ACETAMINOPHEN-CODEINE #3 300-30 MG PO TABS
ORAL_TABLET | ORAL | Status: AC
  Filled 2021-09-05: qty 2

## 2021-09-05 MED ORDER — ACETAMINOPHEN-CODEINE #3 300-30 MG PO TABS
2.0000 | ORAL_TABLET | Freq: Once | ORAL | Status: AC
  Administered 2021-09-05: 2 via ORAL

## 2021-09-05 NOTE — Progress Notes (Signed)
Reassessed pt after pain medication. No adverse reactions and pt reports her pain has improved. Pt d/c'd home via w/c.

## 2021-09-05 NOTE — Discharge Instructions (Signed)
Return with vaginal bleeding, strong regular contractions, leaking fluid, or decreased fetal movement

## 2021-09-05 NOTE — Telephone Encounter (Signed)
Pt called requesting an appointment, she cancelled her apt on 12-13/. Pt states that she has been having irregular contractions, and laboring back pian. After consulting CMA (CB), I advised pt to go to ER so she could be check at Labor and Delivery. Pt states that she is 36-37 weeks and has had pre-term labor with her two children. Pt verbalized understanding.

## 2021-09-05 NOTE — OB Triage Note (Signed)
Pt presents to L&D with c/o contraction off and on all night, with back pain starting at 9am. Pt reports good fetal movement, denies LOF, vaginal bleeding. Efm and toco applied and explained. Plan to monitor fetal and maternal well being and assess for labor. Pt reports being 3cm on Saturday.

## 2021-09-09 ENCOUNTER — Encounter: Payer: Self-pay | Admitting: Certified Nurse Midwife

## 2021-09-09 ENCOUNTER — Inpatient Hospital Stay: Admitting: Anesthesiology

## 2021-09-09 ENCOUNTER — Ambulatory Visit (INDEPENDENT_AMBULATORY_CARE_PROVIDER_SITE_OTHER): Admitting: Certified Nurse Midwife

## 2021-09-09 ENCOUNTER — Inpatient Hospital Stay
Admission: EM | Admit: 2021-09-09 | Discharge: 2021-09-11 | DRG: 807 | Disposition: A | Discharge status: Home / Self Care | Attending: Certified Nurse Midwife | Admitting: Certified Nurse Midwife

## 2021-09-09 ENCOUNTER — Other Ambulatory Visit: Payer: Self-pay

## 2021-09-09 VITALS — BP 115/78 | HR 81 | Wt 150.2 lb

## 2021-09-09 DIAGNOSIS — O4292 Full-term premature rupture of membranes, unspecified as to length of time between rupture and onset of labor: Secondary | ICD-10-CM | POA: Diagnosis present

## 2021-09-09 DIAGNOSIS — Z3A37 37 weeks gestation of pregnancy: Secondary | ICD-10-CM | POA: Diagnosis not present

## 2021-09-09 DIAGNOSIS — M549 Dorsalgia, unspecified: Secondary | ICD-10-CM | POA: Diagnosis not present

## 2021-09-09 DIAGNOSIS — Z8661 Personal history of infections of the central nervous system: Secondary | ICD-10-CM | POA: Diagnosis not present

## 2021-09-09 DIAGNOSIS — Z20822 Contact with and (suspected) exposure to covid-19: Secondary | ICD-10-CM | POA: Diagnosis present

## 2021-09-09 DIAGNOSIS — O99893 Other specified diseases and conditions complicating puerperium: Secondary | ICD-10-CM | POA: Diagnosis not present

## 2021-09-09 DIAGNOSIS — Z3483 Encounter for supervision of other normal pregnancy, third trimester: Secondary | ICD-10-CM

## 2021-09-09 DIAGNOSIS — O4202 Full-term premature rupture of membranes, onset of labor within 24 hours of rupture: Secondary | ICD-10-CM | POA: Diagnosis not present

## 2021-09-09 DIAGNOSIS — O99824 Streptococcus B carrier state complicating childbirth: Secondary | ICD-10-CM | POA: Diagnosis present

## 2021-09-09 LAB — POCT URINALYSIS DIPSTICK OB
Bilirubin, UA: NEGATIVE
Blood, UA: NEGATIVE
Glucose, UA: NEGATIVE
Ketones, UA: NEGATIVE
Leukocytes, UA: NEGATIVE
Nitrite, UA: NEGATIVE
POC,PROTEIN,UA: NEGATIVE
Spec Grav, UA: 1.015 (ref 1.010–1.025)
Urobilinogen, UA: 0.2 E.U./dL
pH, UA: 7 (ref 5.0–8.0)

## 2021-09-09 LAB — RESP PANEL BY RT-PCR (FLU A&B, COVID) ARPGX2
Influenza A by PCR: NEGATIVE
Influenza B by PCR: NEGATIVE
SARS Coronavirus 2 by RT PCR: NEGATIVE

## 2021-09-09 LAB — CBC
HCT: 33.9 % — ABNORMAL LOW (ref 36.0–46.0)
Hemoglobin: 11.2 g/dL — ABNORMAL LOW (ref 12.0–15.0)
MCH: 27.8 pg (ref 26.0–34.0)
MCHC: 33 g/dL (ref 30.0–36.0)
MCV: 84.1 fL (ref 80.0–100.0)
Platelets: 284 10*3/uL (ref 150–400)
RBC: 4.03 MIL/uL (ref 3.87–5.11)
RDW: 12.4 % (ref 11.5–15.5)
WBC: 7.8 10*3/uL (ref 4.0–10.5)
nRBC: 0 % (ref 0.0–0.2)

## 2021-09-09 LAB — TYPE AND SCREEN
ABO/RH(D): A POS
Antibody Screen: NEGATIVE

## 2021-09-09 MED ORDER — IBUPROFEN 600 MG PO TABS
600.0000 mg | ORAL_TABLET | Freq: Four times a day (QID) | ORAL | Status: DC
  Administered 2021-09-09 – 2021-09-10 (×2): 600 mg via ORAL
  Filled 2021-09-09 (×2): qty 1

## 2021-09-09 MED ORDER — ACETAMINOPHEN 325 MG PO TABS
650.0000 mg | ORAL_TABLET | ORAL | Status: DC | PRN
  Administered 2021-09-09 – 2021-09-11 (×3): 650 mg via ORAL
  Filled 2021-09-09 (×4): qty 2

## 2021-09-09 MED ORDER — BENZOCAINE-MENTHOL 20-0.5 % EX AERO
1.0000 "application " | INHALATION_SPRAY | CUTANEOUS | Status: DC | PRN
  Administered 2021-09-09: 1 via TOPICAL
  Filled 2021-09-09: qty 56

## 2021-09-09 MED ORDER — DOCUSATE SODIUM 100 MG PO CAPS
100.0000 mg | ORAL_CAPSULE | Freq: Two times a day (BID) | ORAL | Status: DC
  Administered 2021-09-10 – 2021-09-11 (×3): 100 mg via ORAL
  Filled 2021-09-09 (×3): qty 1

## 2021-09-09 MED ORDER — SENNOSIDES-DOCUSATE SODIUM 8.6-50 MG PO TABS
2.0000 | ORAL_TABLET | ORAL | Status: DC
  Administered 2021-09-09: 21:00:00 2 via ORAL
  Filled 2021-09-09: qty 2

## 2021-09-09 MED ORDER — AMMONIA AROMATIC IN INHA
RESPIRATORY_TRACT | Status: AC
  Filled 2021-09-09: qty 10

## 2021-09-09 MED ORDER — LIDOCAINE HCL (PF) 1 % IJ SOLN
30.0000 mL | INTRAMUSCULAR | Status: DC | PRN
  Filled 2021-09-09: qty 30

## 2021-09-09 MED ORDER — LACTATED RINGERS IV SOLN: 500.0000 mL | INTRAVENOUS | Status: DC | PRN

## 2021-09-09 MED ORDER — METHYLERGONOVINE MALEATE 0.2 MG PO TABS
0.2000 mg | ORAL_TABLET | ORAL | Status: DC | PRN
  Filled 2021-09-09: qty 1

## 2021-09-09 MED ORDER — SODIUM CHLORIDE 0.9 % IV SOLN
2.0000 g | Freq: Once | INTRAVENOUS | Status: AC
  Administered 2021-09-09: 12:00:00 2 g via INTRAVENOUS
  Filled 2021-09-09: qty 2000

## 2021-09-09 MED ORDER — OXYTOCIN-SODIUM CHLORIDE 30-0.9 UT/500ML-% IV SOLN
2.5000 [IU]/h | INTRAVENOUS | Status: DC
  Filled 2021-09-09: qty 500

## 2021-09-09 MED ORDER — DIBUCAINE (PERIANAL) 1 % EX OINT
1.0000 "application " | TOPICAL_OINTMENT | CUTANEOUS | Status: DC | PRN
  Filled 2021-09-09: qty 28

## 2021-09-09 MED ORDER — SODIUM CHLORIDE 0.9 % IV SOLN
1.0000 g | INTRAVENOUS | Status: DC
  Administered 2021-09-09: 16:00:00 1 g via INTRAVENOUS
  Filled 2021-09-09: qty 1000

## 2021-09-09 MED ORDER — LIDOCAINE HCL (PF) 1 % IJ SOLN
INTRAMUSCULAR | Status: DC | PRN
  Administered 2021-09-09: 3 mL

## 2021-09-09 MED ORDER — OXYCODONE-ACETAMINOPHEN 5-325 MG PO TABS
1.0000 | ORAL_TABLET | ORAL | Status: DC | PRN
  Administered 2021-09-11: 05:00:00 1 via ORAL
  Filled 2021-09-09: qty 1

## 2021-09-09 MED ORDER — OXYTOCIN BOLUS FROM INFUSION
333.0000 mL | Freq: Once | INTRAVENOUS | Status: AC
  Administered 2021-09-09: 18:00:00 333 mL via INTRAVENOUS

## 2021-09-09 MED ORDER — ONDANSETRON HCL 4 MG/2ML IJ SOLN: 4.0000 mg | INTRAMUSCULAR | Status: DC | PRN

## 2021-09-09 MED ORDER — WITCH HAZEL-GLYCERIN EX PADS
1.0000 "application " | MEDICATED_PAD | CUTANEOUS | Status: DC | PRN
  Administered 2021-09-09: 1 via TOPICAL
  Filled 2021-09-09: qty 100

## 2021-09-09 MED ORDER — OXYTOCIN 10 UNIT/ML IJ SOLN
INTRAMUSCULAR | Status: AC
  Filled 2021-09-09: qty 2

## 2021-09-09 MED ORDER — SIMETHICONE 80 MG PO CHEW: 80.0000 mg | CHEWABLE_TABLET | ORAL | Status: DC | PRN

## 2021-09-09 MED ORDER — METHYLERGONOVINE MALEATE 0.2 MG/ML IJ SOLN
0.2000 mg | INTRAMUSCULAR | Status: DC | PRN
  Filled 2021-09-09: qty 1

## 2021-09-09 MED ORDER — FENTANYL-BUPIVACAINE-NACL 0.5-0.125-0.9 MG/250ML-% EP SOLN
EPIDURAL | Status: AC
  Filled 2021-09-09: qty 250

## 2021-09-09 MED ORDER — OXYCODONE-ACETAMINOPHEN 5-325 MG PO TABS
2.0000 | ORAL_TABLET | ORAL | Status: DC | PRN
  Administered 2021-09-10 – 2021-09-11 (×3): 2 via ORAL
  Filled 2021-09-09 (×4): qty 2

## 2021-09-09 MED ORDER — FENTANYL-BUPIVACAINE-NACL 0.5-0.125-0.9 MG/250ML-% EP SOLN
EPIDURAL | Status: DC | PRN
  Administered 2021-09-09: 12 mL/h via EPIDURAL

## 2021-09-09 MED ORDER — FERROUS SULFATE 325 (65 FE) MG PO TABS
325.0000 mg | ORAL_TABLET | Freq: Every day | ORAL | Status: DC
  Administered 2021-09-10 – 2021-09-11 (×2): 325 mg via ORAL
  Filled 2021-09-09 (×2): qty 1

## 2021-09-09 MED ORDER — OXYTOCIN-SODIUM CHLORIDE 30-0.9 UT/500ML-% IV SOLN
1.0000 m[IU]/min | INTRAVENOUS | Status: DC
  Administered 2021-09-09: 15:00:00 1 m[IU]/min via INTRAVENOUS

## 2021-09-09 MED ORDER — TERBUTALINE SULFATE 1 MG/ML IJ SOLN: 0.2500 mg | Freq: Once | INTRAMUSCULAR | Status: DC | PRN

## 2021-09-09 MED ORDER — COCONUT OIL OIL
1.0000 "application " | TOPICAL_OIL | Status: DC | PRN
  Administered 2021-09-10: 1 via TOPICAL
  Filled 2021-09-09 (×2): qty 120

## 2021-09-09 MED ORDER — LACTATED RINGERS IV SOLN: INTRAVENOUS | Status: DC

## 2021-09-09 MED ORDER — BUPIVACAINE HCL (PF) 0.25 % IJ SOLN
INTRAMUSCULAR | Status: DC | PRN
  Administered 2021-09-09: 5 mL via EPIDURAL
  Administered 2021-09-09: 4 mL via EPIDURAL

## 2021-09-09 MED ORDER — ONDANSETRON HCL 4 MG PO TABS: 4.0000 mg | ORAL_TABLET | ORAL | Status: DC | PRN

## 2021-09-09 MED ORDER — PRENATAL MULTIVITAMIN CH
1.0000 | ORAL_TABLET | Freq: Every day | ORAL | Status: DC
  Administered 2021-09-10 – 2021-09-11 (×2): 1 via ORAL
  Filled 2021-09-09 (×2): qty 1

## 2021-09-09 MED ORDER — MISOPROSTOL 200 MCG PO TABS
ORAL_TABLET | ORAL | Status: AC
  Filled 2021-09-09: qty 4

## 2021-09-09 MED ORDER — LIDOCAINE-EPINEPHRINE (PF) 1.5 %-1:200000 IJ SOLN
INTRAMUSCULAR | Status: DC | PRN
  Administered 2021-09-09: 5 mL via PERINEURAL

## 2021-09-09 NOTE — Anesthesia Preprocedure Evaluation (Signed)
Anesthesia Evaluation  Patient identified by MRN, date of birth, ID band Patient awake    Airway Mallampati: I  TM Distance: >3 FB Neck ROM: Full    Dental no notable dental hx. (+) Teeth Intact   Pulmonary neg pulmonary ROS,           Cardiovascular negative cardio ROS       Neuro/Psych Depression negative neurological ROS     GI/Hepatic Neg liver ROS, GERD  Controlled,  Endo/Other  negative endocrine ROS  Renal/GU negative Renal ROS  negative genitourinary   Musculoskeletal   Abdominal   Peds negative pediatric ROS (+)  Hematology  (+) anemia ,   Anesthesia Other Findings   Reproductive/Obstetrics (+) Pregnancy                             Anesthesia Physical Anesthesia Plan  ASA: 2  Anesthesia Plan: Epidural   Post-op Pain Management:    Induction:   PONV Risk Score and Plan:   Airway Management Planned:   Additional Equipment:   Intra-op Plan:   Post-operative Plan:   Informed Consent: I have reviewed the patients History and Physical, chart, labs and discussed the procedure including the risks, benefits and alternatives for the proposed anesthesia with the patient or authorized representative who has indicated his/her understanding and acceptance.       Plan Discussed with: Anesthesiologist  Anesthesia Plan Comments:         Anesthesia Quick Evaluation

## 2021-09-09 NOTE — Anesthesia Procedure Notes (Signed)
Epidural Patient location during procedure: OB Start time: 09/09/2021 2:58 PM End time: 09/09/2021 3:25 PM  Staffing Performed: resident/CRNA   Preanesthetic Checklist Completed: patient identified, IV checked, site marked, risks and benefits discussed, surgical consent, monitors and equipment checked, pre-op evaluation and timeout performed  Epidural Patient position: sitting Prep: Betadine Patient monitoring: heart rate, continuous pulse ox and blood pressure Approach: midline Location: L4-L5 Injection technique: LOR saline  Needle:  Needle type: Tuohy  Needle gauge: 18 G Needle length: 9 cm and 9 Needle insertion depth: 6 and 11 cm Catheter type: closed end flexible Catheter size: 20 Guage Test dose: negative and 1.5% lidocaine with Epi 1:200 K  Assessment Events: blood not aspirated, injection not painful, no injection resistance, no paresthesia and negative IV test  Additional Notes   Patient tolerated the insertion well without complications. Pain previous to epidural 6-8, after 3-4Reason for block:procedure for pain

## 2021-09-09 NOTE — OB Triage Note (Incomplete)
Pt was sent over from [facility] ob office after being seen by Donette Larry. SVE performed in office pt was 5/70/-2 and grossly ruptured. Pt came to L&D and was admitted. CNM placed orders and antibiotics were started for GBS prophylaxes. Pt is having contractions but no pain associated with ctx. Pt denies VB and states positive fetal movement. External monitors applied to nontender abdomen.

## 2021-09-09 NOTE — Progress Notes (Signed)
LABOR NOTE   Christina Ruiz 30 y.o.GP@ at [redacted]w[redacted]d  SUBJECTIVE:  Comfortable with epidural. Has some mild pressure Analgesia: Epidural  OBJECTIVE:  BP 111/66 (BP Location: Right Arm)    Pulse 73    Temp 98.2 F (36.8 C) (Oral)    Resp 18    Ht 5\' 3"  (1.6 m)    Wt 68 kg    LMP  (LMP Unknown)    SpO2 100%    BMI 26.57 kg/m  Total I/O In: 601.3 [I.V.:501.3; IV Piggyback:100] Out: -   She has shown cervical change. CERVIX: 7cm:  90%:   -1:   mid position:   soft SVE:   Dilation: 7 Effacement (%): 80 Station: -1 Exam by:: A.Aviraj Kentner,CNM CONTRACTIONS: regular, every 2-3 minutes FHR: Fetal heart tracing reviewed. Baseline: 120 bpm, Variability: Good {> 6 bpm), Accelerations: Reactive, and Decelerations: Early Category I    Labs: Lab Results  Component Value Date   WBC 7.8 09/09/2021   HGB 11.2 (L) 09/09/2021   HCT 33.9 (L) 09/09/2021   MCV 84.1 09/09/2021   PLT 284 09/09/2021    ASSESSMENT: 1) Labor curve reviewed.       Progress: Active phase labor.     Membranes: ruptured, clear fluid            Active Problems:   Labor and delivery, indication for care   PLAN: continue present management   09/11/2021, CNM  09/09/2021 4:52 PM
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
## 2021-09-09 NOTE — Progress Notes (Signed)
ROB doing well, c/o irregular contractions and leaking of fluid . Was at the 09/02/21 for evaluation of leaking fluid. ON exam today blood /clear fluid noted . SVE 5/80/-2 station. Pt sent to L&D for augmentation.   Doreene Burke, CNM

## 2021-09-09 NOTE — Discharge Summary (Signed)
° ° °  L&D OB Triage Note  SUBJECTIVE Christina Ruiz is a 30 y.o. G8T1572 female at [redacted]w[redacted]d, EDD Estimated Date of Delivery: 09/24/21 who presented to triage with complaints of back pain all night with irregular contractions.  Patient has a history of preterm birth.   OB History  Gravida Para Term Preterm AB Living  4 2 2  0 1 2  SAB IAB Ectopic Multiple Live Births  1 0 0 1 2    # Outcome Date GA Lbr Len/2nd Weight Sex Delivery Anes PTL Lv  4A Gravida           4B Current           3 Term 06/06/20 [redacted]w[redacted]d 05:10 / 00:09 2720 g F Vag-Spont EPI  LIV     Name: Christina Ruiz     Apgar1: 8  Apgar5: 9  2 SAB 2020          1 Term 02/25/18 [redacted]w[redacted]d / 01:08 3572 g M Vag-Spont EPI  LIV     Birth Comments: none     Name: Christina Ruiz     Apgar1: 8  Apgar5: 9    Medications Prior to Admission  Medication Sig Dispense Refill Last Dose   famotidine (PEPCID) 20 MG tablet Take 1 tablet (20 mg total) by mouth 2 (two) times daily. (Patient not taking: Reported on 08/31/2021) 60 tablet 3    Iron-FA-B Cmp-C-Biot-Probiotic (FUSION PLUS) CAPS Take 1 tablet by mouth daily. (Patient not taking: Reported on 08/31/2021) 30 capsule 9    nitrofurantoin, macrocrystal-monohydrate, (MACROBID) 100 MG capsule Take 1 capsule (100 mg total) by mouth daily. (Patient not taking: Reported on 08/31/2021) 30 capsule 4    Prenat-FeFmCb-DSS-FA-DHA w/o A (CITRANATAL HARMONY) 27-1-260 MG CAPS Take 1 tablet by mouth daily. (Patient not taking: Reported on 09/09/2021) 30 capsule 11    promethazine (PHENERGAN) 12.5 MG tablet Take 1 tablet (12.5 mg total) by mouth every 6 (six) hours as needed for nausea or vomiting. (Patient not taking: Reported on 09/09/2021) 30 tablet 0 Past Week     OBJECTIVE  Nursing Evaluation:   BP 107/67    Pulse 85    Temp 98.4 F (36.9 C) (Oral)    Resp 18    Ht 5\' 3"  (1.6 m)    Wt 67.6 kg    LMP  (LMP Unknown)    BMI 26.39 kg/m    Findings:        Patient not found to be in labor     Back pain  improved with medication       NST was performed and has been reviewed by me.  NST INTERPRETATION: Category I  Mode: External Baseline Rate (A): 125 bpm Variability: Moderate Accelerations: 15 x 15 Decelerations: None     Contraction Frequency (min): occasional  ASSESSMENT Impression:  1.  Pregnancy:  09/11/2021 at [redacted]w[redacted]d , EDD Estimated Date of Delivery: 09/24/21 2.  Reassuring fetal and maternal status   PLAN 1. Current condition and above findings reviewed.  Reassuring fetal and maternal condition. 2. Discharge home with standard labor precautions given to return to L&D or call the office for problems. 3. Continue routine prenatal care.

## 2021-09-10 LAB — CBC
HCT: 30.9 % — ABNORMAL LOW (ref 36.0–46.0)
Hemoglobin: 10 g/dL — ABNORMAL LOW (ref 12.0–15.0)
MCH: 27.2 pg (ref 26.0–34.0)
MCHC: 32.4 g/dL (ref 30.0–36.0)
MCV: 84 fL (ref 80.0–100.0)
Platelets: 240 10*3/uL (ref 150–400)
RBC: 3.68 MIL/uL — ABNORMAL LOW (ref 3.87–5.11)
RDW: 12.7 % (ref 11.5–15.5)
WBC: 8.7 10*3/uL (ref 4.0–10.5)
nRBC: 0 % (ref 0.0–0.2)

## 2021-09-10 LAB — RPR: RPR Ser Ql: NONREACTIVE

## 2021-09-10 MED ORDER — OXYCODONE HCL 5 MG PO TABS
10.0000 mg | ORAL_TABLET | Freq: Once | ORAL | Status: AC
  Administered 2021-09-10: 10:00:00 10 mg via ORAL
  Filled 2021-09-10: qty 2

## 2021-09-10 MED ORDER — GUAIFENESIN ER 600 MG PO TB12
600.0000 mg | ORAL_TABLET | Freq: Two times a day (BID) | ORAL | Status: DC
  Administered 2021-09-11: 12:00:00 600 mg via ORAL
  Filled 2021-09-10 (×6): qty 1

## 2021-09-10 MED ORDER — IBUPROFEN 600 MG PO TABS
600.0000 mg | ORAL_TABLET | Freq: Four times a day (QID) | ORAL | Status: DC
  Administered 2021-09-10 – 2021-09-11 (×6): 600 mg via ORAL
  Filled 2021-09-10 (×6): qty 1

## 2021-09-10 NOTE — Anesthesia Postprocedure Evaluation (Signed)
Anesthesia Post Note  Patient: Christina Ruiz  Procedure(s) Performed: AN AD HOC LABOR EPIDURAL  Patient location during evaluation: Mother Baby Anesthesia Type: Epidural Level of consciousness: awake Pain management: pain level controlled Respiratory status: spontaneous breathing Cardiovascular status: stable Postop Assessment: no headache Anesthetic complications: no   No notable events documented.   Last Vitals:  Vitals:   09/09/21 2258 09/10/21 0256  BP: (!) 103/59 106/72  Pulse: 84 69  Resp: 18 18  Temp: 36.9 C 36.8 C  SpO2: 98% 97%    Last Pain:  Vitals:   09/10/21 0256  TempSrc: Oral  PainSc:                  Jaye Beagle

## 2021-09-10 NOTE — TOC Initial Note (Signed)
Transition of Care [facility]) - Initial/Assessment Note    Patient Details  Name: Christina Ruiz MRN: [id number] Date of Birth: 12-18-1990  Transition of Care [facility]) CM/SW Contact:    Divernon Cellar, RN Phone Number: 09/10/2021, 4:24 PM  Clinical Narrative:                 Mom in room with support system and infant. Currently reports spinal headache and rest. No current concerns from treatment team. Has other children and strong support system. Will follow up once patient reports feeling up to talking.         Patient Goals and CMS Choice        Expected Discharge Plan and Services                                                Prior Living Arrangements/Services                       Activities of Daily Living Home Assistive Devices/Equipment: None ADL Screening (condition at time of admission) Patient's cognitive ability adequate to safely complete daily activities?: Yes Is the patient deaf or have difficulty hearing?: No Does the patient have difficulty seeing, even when wearing glasses/contacts?: No Does the patient have difficulty concentrating, remembering, or making decisions?: No Patient able to express need for assistance with ADLs?: Yes Does the patient have difficulty dressing or bathing?: No Independently performs ADLs?: Yes (appropriate for developmental age) Does the patient have difficulty walking or climbing stairs?: No Weakness of Legs: None Weakness of Arms/Hands: None  Permission Sought/Granted                  Emotional Assessment              Admission diagnosis:  Labor and delivery, indication for care [O75.9] Patient Active Problem List   Diagnosis Date Noted   Labor and delivery indication for care or intervention 09/05/2021   Indication for care in labor or delivery 09/02/2021   Pregnancy 08/31/2021   Labor and delivery, indication for care 05/16/2020   PCP:  Doreene Burke, CNM Pharmacy:   [id number] [place], [place] - [address] AT [facility] OF [address] [place] [address] Phone: [phone] Fax: [phone]  [id number] - [place], [place] - [address] AT [facility] [address] [place] [address] Phone: [phone] Fax: [phone]     Social Determinants of Health (SDOH) Interventions    Readmission Risk Interventions No flowsheet data found.

## 2021-09-10 NOTE — Lactation Note (Signed)
This note was copied from a baby's chart. Lactation Consultation Note  Patient Name: Christina Ruiz DHWYS'H Date: 09/10/2021 Reason for consult: Initial assessment;Early term 37-38.6wks Age:30 years  Initial lactation visit. Mom is P3, SVD 17 hrs ago. Mom has breastfed her 2 other children for over a year, and still has freezer of expressed breastmilk (youngest is 15 months).  Mom is in discomfort with suspected spinal headache and possible sinus congestion. She has asked for information on medication Mucinex to aid in the congestion. Mucinex is a L3 (probably compatible drug), with potential infant concerns of drowsy/sleepiness. Mom does voice possible desire for more natural remedies and will consider this medication as an option to aid in comfort.  While in room baby became hungry, cuing with dad. Mom lying on her back due to headache was encouraged by [facility] to aim for side-lying position to prevent having to raise the head of the bed. Mom independent in positioning the baby, a few on/offs before sustaining latch in new position- visible strong rhythmic sucking shown, mom notes tugging/pulling w/ no discomfort. Encouraged to keep baby awake, reviewed early cues, feeding patterns in first 24 hours, and output expectations. Whiteboard updated with LC name/number; encouraged to call for ongoing support as needed.  Maternal Data Has patient been taught Hand Expression?: Yes Does the patient have breastfeeding experience prior to this delivery?: Yes How long did the patient breastfeed?: 1+yr with other 2 children  Feeding Mother's Current Feeding Choice: Breast Milk  LATCH Score Latch: Repeated attempts needed to sustain latch, nipple held in mouth throughout feeding, stimulation needed to elicit sucking reflex.  Audible Swallowing: A few with stimulation  Type of Nipple: Everted at rest and after stimulation  Comfort (Breast/Nipple): Soft / non-tender  Hold (Positioning): No assistance  needed to correctly position infant at breast.  LATCH Score: 8   Lactation Tools Discussed/Used    Interventions Interventions: Breast feeding basics reviewed;Hand express;Position options;Education (Medications while sick)  Discharge Pump: Personal  Consult Status Consult Status: PRN    Danford Bad 09/10/2021, 1:40 PM
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
## 2021-09-10 NOTE — Progress Notes (Signed)
Pt requests Dr. Reino Kent for caffeine for headache. Changed notes in diet order for dining services to send up for her.

## 2021-09-10 NOTE — Progress Notes (Signed)
Pt complained of some back pain and a slight headache this morning around 0800 when she first woke up. When I went in to complete her full assessment at 0900, it had gotten significantly worse in her back and headache. She stated she was not fully awake when anesthesia came through this morning to let them know of her complaints. Pt states she feels better when laying flat. She also requested a decongestant due to congestion. Notified Dr. Pernell Dupre and Doreene Burke, CNM.   Mucinex ordered 600mg  2/day. Explained impact of milk supply and to drink lots of fluids. Motrin and Tylenol given at 0907. 0908, CNM ordered to give something stronger. One dose of Roxicodone 10mg  given at 1003.   Encouraged pt to lay flat and call for help to get out of bed. FOB and mother in room to help with infant.

## 2021-09-10 NOTE — Discharge Instructions (Addendum)
Discharge Instructions:   2-week televisit: Office will call.  6-week in office visit: Monday, 1/30 at 10:30am  If there are any new medications, they have been ordered and will be available for pickup at the listed pharmacy on your way home from the hospital.   Call office if you have any of the following: headache, visual changes, fever >101.0 F, chills, shortness of breath, breast concerns, excessive vaginal bleeding, incision drainage or problems, leg pain or redness, depression or any other concerns. If you have vaginal discharge with an odor, let your doctor know.   It is normal to bleed for up to 6 weeks. You should not soak through more than 1 pad in 1 hour. If you have a blood clot larger than your fist with continued bleeding, call your doctor.   Activity: Do not lift > 10 lbs for 6 weeks (do not lift anything heavier than your baby). No intercourse, tampons, swimming pools, hot tubs, baths (only showers) for 6 weeks.  No driving for 1-2 weeks. Continue prenatal vitamin, especially if breastfeeding. Increase calories and fluids (water) while breastfeeding.   Your milk will come in, in the next couple of days (right now it is colostrum). You may have a slight fever when your milk comes in, but it should go away on its own.  If it does not, and rises above 101 F please call the doctor. You will also feel achy and your breasts will be firm. They will also start to leak. If you are breastfeeding, continue as you have been and you can pump/express milk for comfort.   If you have too much milk, your breasts can become engorged, which could lead to mastitis. This is an infection of the milk ducts. It can be very painful and you will need to notify your doctor to obtain a prescription for antibiotics. You can also treat it with a shower or hot/cold compress.   For concerns about your baby, please call your pediatrician.  For breastfeeding concerns, the lactation consultant can be reached at  [phone].   Postpartum blues (feelings of happy one minute and sad another minute) are normal for the first few weeks but if it gets worse let your doctor know.   Congratulations! We enjoyed caring for you and your new bundle of joy!

## 2021-09-10 NOTE — Progress Notes (Signed)
Checked in again with Dr. Karlton Lemon. It is 24 hours from start of  symptoms.They will check in again with her in the morning. Will pass along to night shift nurse.

## 2021-09-11 ENCOUNTER — Inpatient Hospital Stay: Admitting: Anesthesiology

## 2021-09-11 ENCOUNTER — Inpatient Hospital Stay

## 2021-09-11 MED ORDER — OXYCODONE-ACETAMINOPHEN 5-325 MG PO TABS: 1.0000 | ORAL_TABLET | ORAL | 0 refills | Status: DC | PRN

## 2021-09-11 NOTE — Progress Notes (Signed)
Pt discharged with infant.  Discharge instructions, prescriptions and follow up appointment given to and reviewed with pt. Pt verbalized understanding. Escorted out by auxillary. 
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
## 2021-09-11 NOTE — Anesthesia Postprocedure Evaluation (Signed)
Anesthesia Post Note  Patient: Nailea Whitehorn  Procedure(s) Performed: AN AD HOC BLOOD PATCH  Patient location during evaluation: PACU Anesthesia Type: Epidural Level of consciousness: awake and alert Pain management: pain level controlled Vital Signs Assessment: post-procedure vital signs reviewed and stable Respiratory status: spontaneous breathing, nonlabored ventilation, respiratory function stable and patient connected to nasal cannula oxygen Cardiovascular status: blood pressure returned to baseline and stable Postop Assessment: no apparent nausea or vomiting Anesthetic complications: no   No notable events documented.   Last Vitals:  Vitals:   09/11/21 1015 09/11/21 1030  BP: 112/66 112/66  Pulse:    Resp: 16 17  Temp:    SpO2: 98% 98%    Last Pain:  Vitals:   09/11/21 0855  TempSrc:   PainSc: 8                  Corinda Gubler

## 2021-09-11 NOTE — Transfer of Care (Signed)
Immediate Anesthesia Transfer of Care Note  Patient: Christina Ruiz  Procedure(s) Performed: AN AD HOC BLOOD PATCH  Patient Location: PACU  Anesthesia Type:Epidural  Level of Consciousness: awake, alert  and oriented  Airway & Oxygen Therapy: Patient Spontanous Breathing  Post-op Assessment: Report given to RN and Post -op Vital signs reviewed and stable  Post vital signs: Reviewed and stable  Last Vitals:  Vitals Value Taken Time  BP    Temp    Pulse 70 09/11/21 1041  Resp    SpO2 98 % 09/11/21 1041  Vitals shown include unvalidated device data.  Last Pain:  Vitals:   09/11/21 0855  TempSrc:   PainSc: 8          Complications: No notable events documented.

## 2021-09-11 NOTE — Progress Notes (Signed)
Patient transported to PACU via bed for Blood patch

## 2021-09-11 NOTE — TOC Progression Note (Signed)
Transition of Care [facility]) - Progression Note    Patient Details  Name: Christina Ruiz MRN: [id number] Date of Birth: [birth year]  Transition of Care [facility] CM/SW Contact  Lookout Cellar, RN Phone Number: 09/11/2021, 3:26 PM  Clinical Narrative:    Attempted to see patient and spouse at bedside however infant, and both parents were sleeping. Mother recently returned from blood patch procedure. Discussed with RN and no other concerns.         Expected Discharge Plan and Services                                                 Social Determinants of Health (SDOH) Interventions    Readmission Risk Interventions No flowsheet data found.

## 2021-09-11 NOTE — Anesthesia Procedure Notes (Signed)
Epidural Patient location during procedure: OB Start time: 09/11/2021 10:00 AM End time: 09/11/2021 10:36 AM  Staffing Anesthesiologist: Corinda Gubler, MD Performed: anesthesiologist   Preanesthetic Checklist Completed: patient identified, IV checked, site marked, risks and benefits discussed, surgical consent, monitors and equipment checked, pre-op evaluation and timeout performed  Epidural Patient position: left lateral decubitus Prep: ChloraPrep Patient monitoring: heart rate, continuous pulse ox and blood pressure Approach: midline Location: L3-L4 Injection technique: LOR saline  Needle:  Needle type: Tuohy  Needle gauge: 17 G Needle length: 9 cm Needle insertion depth: 4 cm  Assessment Events: blood not aspirated, injection not painful, no injection resistance and no paresthesia  Additional Notes Epidural blood patch  Pt. Evaluated and documentation done after procedure finished. Patient identified. Risks/Benefits/Options discussed with patient including but not limited to bleeding, infection, nerve damage, paralysis, incomplete pain control, headache. Confirmed with bedside nurse the patient's most recent platelet count. Confirmed with patient that they are not currently taking any anticoagulation, have any bleeding history or any family history of bleeding disorders. Patient expressed understanding and wished to proceed. All questions were answered. Sterile technique was used throughout the entire procedure. Please see nursing notes for vital signs. Upon access of epidural space, PACU RN performed a sterile blood draw which I injected into the epidural space. 47ml of total blood was injected.    Patient tolerated the insertion well without immediate complications. Reason for block:procedure for pain

## 2021-09-11 NOTE — Final Progress Note (Signed)
Discharge Day SOAP Note:  Progress Note - Vaginal Delivery  Christina Ruiz is a 30 y.o. [phone] now PP day 2 s/p Vaginal, Spontaneous . Delivery was uncomplicated  Subjective  The patient has the following complaints: complains of headache has greatly improved with blood patch.   Pain is controlled with current medications.   Patient is urinating without difficulty.  She is ambulating well.     Objective  Vital signs: BP (!) 90/50 (BP Location: Left Arm) Comment: RN notified   Pulse 78    Temp 98.7 F (37.1 C) (Oral)    Resp 16    Ht 5\' 3"  (1.6 m)    Wt 68 kg    LMP  (LMP Unknown)    SpO2 100%    Breastfeeding Unknown    BMI 26.57 kg/m   Physical Exam: Gen: NAD Fundus Fundal Tone: Firm  Lochia Amount: Small        Data Review Labs: Lab Results  Component Value Date   WBC 8.7 09/10/2021   HGB 10.0 (L) 09/10/2021   HCT 30.9 (L) 09/10/2021   MCV 84.0 09/10/2021   PLT 240 09/10/2021   CBC Latest Ref Rng & Units 09/10/2021 09/09/2021 08/14/2021  WBC 4.0 - 10.5 K/uL 8.7 7.8 8.1  Hemoglobin 12.0 - 15.0 g/dL 10.0(L) 11.2(L) 10.1(L)  Hematocrit 36.0 - 46.0 % 30.9(L) 33.9(L) 30.4(L)  Platelets 150 - 400 K/uL 240 284 279   A POS  Edinburgh Score: Edinburgh Postnatal Depression Scale Screening Tool 09/10/2021  I have been able to laugh and see the funny side of things. 0  I have looked forward with enjoyment to things. 0  I have blamed myself unnecessarily when things went wrong. 2  I have been anxious or worried for no good reason. 3  I have felt scared or panicky for no good reason. 2  Things have been getting on top of me. 2  I have been so unhappy that I have had difficulty sleeping. 0  I have felt sad or miserable. 0  I have been so unhappy that I have been crying. 1  The thought of harming myself has occurred to me. 0  Edinburgh Postnatal Depression Scale Total 10    Assessment/Plan  Active Problems:   Labor and delivery, indication for care    Plan for  discharge today.  Discharge Instructions: Per After Visit Summary. Activity: Advance as tolerated. Pelvic rest for 6 weeks.  Also refer to After Visit Summary Diet: Regular Medications: Allergies as of 09/11/2021   No Known Allergies      Medication List     STOP taking these medications    famotidine 20 MG tablet Commonly known as: PEPCID   nitrofurantoin (macrocrystal-monohydrate) 100 MG capsule Commonly known as: Macrobid   promethazine 12.5 MG tablet Commonly known as: PHENERGAN       TAKE these medications    CitraNatal Harmony 27-1-260 MG Caps Take 1 tablet by mouth daily.   Fusion Plus Caps Take 1 tablet by mouth daily.   oxyCODONE-acetaminophen 5-325 MG tablet Commonly known as: PERCOCET/ROXICET Take 1 tablet by mouth every 4 (four) hours as needed (pain scale 4-7).       Outpatient follow up:   Follow-up Information     08-18-1975, CNM. Go on 10/21/2021.   Specialties: Certified Nurse Midwife, Radiology Why: 2-week televisit: Office will call.  6-week in office visit: Monday, 1/30 at 10:30am Contact information: [address] [place] [phone]  Postpartum contraception: Will discuss at first office visit post-partum  Discharged Condition: good  Discharged to: home  Newborn Data: Disposition:home with mother  Apgars: APGAR (1 MIN): 8   APGAR (5 MINS): 8   APGAR (10 MINS):    Baby Feeding: Breast    Philip Aspen, CNM  09/11/2021

## 2021-09-11 NOTE — Progress Notes (Signed)
Patient returned to room from PACU post procedure.  Patient states no pain relief noted at this time.

## 2021-09-11 NOTE — Discharge Summary (Signed)
Patient Name: Christina Ruiz DOB: 10-27-1990 MRN: [id number]                            Discharge Summary  Date of Admission: 09/09/2021 Date of Discharge: 09/11/2021 Delivering Provider: Doreene Burke   Admitting Diagnosis: Labor and delivery, indication for care [O75.9] at [redacted]w[redacted]d Secondary diagnosis:  Active Problems:   Labor and delivery, indication for care   Mode of Delivery: normal spontaneous vaginal delivery              Discharge diagnosis: Term Pregnancy Delivered      Intrapartum Procedures: epidural and GBS prophylaxis   Post partum procedures:  blood patch for spinal headache   Complications: spinal headache                     Discharge Day SOAP Note:  Progress Note - Vaginal Delivery  Christina Ruiz is a 30 y.o. [id number] now PP day 2 s/p Vaginal, Spontaneous . Delivery was uncomplicated  Subjective  The patient has the following complaints: complains of headache has greatly improved with blood patch.   Pain is controlled with current medications.   Patient is urinating without difficulty.  She is ambulating well.     Objective  Vital signs: BP (!) 90/50 (BP Location: Left Arm) Comment: RN notified   Pulse 78    Temp 98.7 F (37.1 C) (Oral)    Resp 16    Ht 5\' 3"  (1.6 m)    Wt 68 kg    LMP  (LMP Unknown)    SpO2 100%    Breastfeeding Unknown    BMI 26.57 kg/m   Physical Exam: Gen: NAD Fundus Fundal Tone: Firm  Lochia Amount: Small        Data Review Labs: Lab Results  Component Value Date   WBC 8.7 09/10/2021   HGB 10.0 (L) 09/10/2021   HCT 30.9 (L) 09/10/2021   MCV 84.0 09/10/2021   PLT 240 09/10/2021   CBC Latest Ref Rng & Units 09/10/2021 09/09/2021 08/14/2021  WBC 4.0 - 10.5 K/uL 8.7 7.8 8.1  Hemoglobin 12.0 - 15.0 g/dL 10.0(L) 11.2(L) 10.1(L)  Hematocrit 36.0 - 46.0 % 30.9(L) 33.9(L) 30.4(L)  Platelets 150 - 400 K/uL 240 284 279   A POS  Edinburgh Score: Edinburgh Postnatal Depression Scale Screening Tool 09/10/2021  I have  been able to laugh and see the funny side of things. 0  I have looked forward with enjoyment to things. 0  I have blamed myself unnecessarily when things went wrong. 2  I have been anxious or worried for no good reason. 3  I have felt scared or panicky for no good reason. 2  Things have been getting on top of me. 2  I have been so unhappy that I have had difficulty sleeping. 0  I have felt sad or miserable. 0  I have been so unhappy that I have been crying. 1  The thought of harming myself has occurred to me. 0  Edinburgh Postnatal Depression Scale Total 10    Assessment/Plan  Active Problems:   Labor and delivery, indication for care    Plan for discharge today.  Discharge Instructions: Per After Visit Summary. Activity: Advance as tolerated. Pelvic rest for 6 weeks.  Also refer to After Visit Summary Diet: Regular Medications: Allergies as of 09/11/2021   No Known Allergies      Medication List  STOP taking these medications    famotidine 20 MG tablet Commonly known as: PEPCID   nitrofurantoin (macrocrystal-monohydrate) 100 MG capsule Commonly known as: Macrobid   promethazine 12.5 MG tablet Commonly known as: PHENERGAN       TAKE these medications    CitraNatal Harmony 27-1-260 MG Caps Take 1 tablet by mouth daily.   Fusion Plus Caps Take 1 tablet by mouth daily.   oxyCODONE-acetaminophen 5-325 MG tablet Commonly known as: PERCOCET/ROXICET Take 1 tablet by mouth every 4 (four) hours as needed (pain scale 4-7).       Outpatient follow up:   Follow-up Information     Philip Aspen, CNM. Go on 10/21/2021.   Specialties: Certified Nurse Midwife, Radiology Why: 2-week televisit: Office will call.  6-week in office visit: Monday, 1/30 at 10:30am Contact information: [place] [address] [phone]                Postpartum contraception: Will discuss at first office visit post-partum  Discharged  Condition: good  Discharged to: home  Newborn Data: Disposition:home with mother  Apgars: APGAR (1 MIN): 8   APGAR (5 MINS): 8   APGAR (10 MINS):    Baby Feeding: Breast    Philip Aspen, CNM  09/11/2021 5:03 PM

## 2021-09-12 ENCOUNTER — Telehealth: Payer: Self-pay | Admitting: Pain Medicine

## 2021-09-12 ENCOUNTER — Telehealth: Payer: Self-pay | Admitting: Certified Nurse Midwife

## 2021-09-12 NOTE — Telephone Encounter (Signed)
Spoke with Dr. Karlton Lemon, he is asking for a blood patch for this patient. They did a blood patch on 09-11-21 which did help some. Dr. Karlton Lemon will call on Tues 09-17-21 if the patient is still experiencing symptoms. I will send in box msg to Dr. Laban Emperor to make him aware of this request.

## 2021-09-12 NOTE — Telephone Encounter (Signed)
Christina Ruiz asked me to call anesthesiology for Urgent Consult- pt del on 12-19- had spinal headache after epidural- pt had blood patch yesterday, pt is experiencing headaches again that are not relieving when laying down.   I spoke with Katie at pre-admit at 1044 12/22- who made anesthesiology aware they are to reach out to pt to see if she can come in today for evaluation- katie provided a number so we can follow up to see if pt was seen. [phone]- I made annie thompson aware of conversation.

## 2021-09-13 ENCOUNTER — Other Ambulatory Visit: Payer: Self-pay | Admitting: Certified Nurse Midwife

## 2021-09-13 MED ORDER — OXYCODONE-ACETAMINOPHEN 5-325 MG PO TABS: 1.0000 | ORAL_TABLET | ORAL | 0 refills | Status: DC | PRN

## 2021-09-13 NOTE — Progress Notes (Signed)
Orders placed for refill on percocet due to postpartum spinal headache. Blood patch on postpartum is not working. She has repeat scheduled next week.   Doreene Burke, CNM

## 2021-09-15 ENCOUNTER — Encounter: Payer: Self-pay | Admitting: Anesthesiology

## 2021-09-15 ENCOUNTER — Other Ambulatory Visit: Payer: Self-pay

## 2021-09-15 ENCOUNTER — Ambulatory Visit: Attending: Anesthesiology

## 2021-09-15 NOTE — Progress Notes (Signed)
Unable to place orders under this patient, Order for IV to be started and LR to infuse until the bag Is in , Order for blood draw for epidural injection, All from Dr Karlton Lemon , anesthesia.

## 2021-09-15 NOTE — Progress Notes (Signed)
Order for Ibuprofen 650po per Dr Karlton Lemon reciieved, , Given to Patient

## 2021-09-15 NOTE — Anesthesia Procedure Notes (Signed)
Epidural Patient location during procedure: OB Start time: 09/15/2021 9:40 AM End time: 09/15/2021 9:50 AM  Staffing Anesthesiologist: Lenard Simmer, MD Performed: anesthesiologist   Preanesthetic Checklist Completed: patient identified, IV checked, site marked, risks and benefits discussed, surgical consent, monitors and equipment checked, pre-op evaluation and timeout performed  Epidural Patient position: sitting Prep: ChloraPrep Patient monitoring: heart rate, continuous pulse ox and blood pressure Approach: midline Location: L3-L4 Injection technique: LOR saline  Needle:  Needle type: Tuohy  Needle gauge: 17 G Needle length: 9 cm and 9 Needle insertion depth: 4 cm Catheter type: closed end flexible Catheter size: 19 Gauge Test dose: negative and 1.5% lidocaine with Epi 1:200 K  Assessment Sensory level: T10 Events: blood not aspirated, injection not painful, no injection resistance, no paresthesia and negative IV test  Additional Notes 1st attempt Pt. Evaluated and documentation done after procedure finished. Loss of resistance obtained at 4cm and nurse had 30 mL of sterile blood in 3 10cc syringes ready for injection.  First of sterile blood injected without difficulty.  Second of sterile blood was also injected (at a slower pace) and patient still without pressure feeling in her back.  Third syringe of sterile blood handed over and slowly injected 5 additional mLs of sterile blood and patient still with no feeling of pressure in her lower back, but felt that this was a good place to stop.  Pressure held post injection to stop bleeding from injection site.  After 2 minutes of lying down patient admitted to significant pressure in her lower back, which improved over time. Patient tolerated the insertion well without immediate complications.Reason for block:procedure for pain
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
## 2021-09-16 ENCOUNTER — Emergency Department
Admission: EM | Admit: 2021-09-16 | Discharge: 2021-09-17 | Disposition: A | Discharge status: Home / Self Care | Source: Home / Self Care | Attending: Emergency Medicine | Admitting: Emergency Medicine

## 2021-09-16 ENCOUNTER — Other Ambulatory Visit: Payer: Self-pay

## 2021-09-16 ENCOUNTER — Emergency Department

## 2021-09-16 ENCOUNTER — Encounter: Payer: Self-pay | Admitting: *Deleted

## 2021-09-16 DIAGNOSIS — R9389 Abnormal findings on diagnostic imaging of other specified body structures: Secondary | ICD-10-CM | POA: Diagnosis not present

## 2021-09-16 DIAGNOSIS — R109 Unspecified abdominal pain: Secondary | ICD-10-CM | POA: Insufficient documentation

## 2021-09-16 DIAGNOSIS — N939 Abnormal uterine and vaginal bleeding, unspecified: Secondary | ICD-10-CM

## 2021-09-16 DIAGNOSIS — R42 Dizziness and giddiness: Secondary | ICD-10-CM | POA: Diagnosis not present

## 2021-09-16 LAB — BASIC METABOLIC PANEL
Anion gap: 6 (ref 5–15)
BUN: 20 mg/dL (ref 6–20)
CO2: 23 mmol/L (ref 22–32)
Calcium: 9 mg/dL (ref 8.9–10.3)
Chloride: 109 mmol/L (ref 98–111)
Creatinine, Ser: 0.67 mg/dL (ref 0.44–1.00)
GFR, Estimated: >60 mL/min (ref >60)
Glucose, Bld: 118 mg/dL — ABNORMAL HIGH (ref 70–99)
Potassium: 4 mmol/L (ref 3.5–5.1)
Sodium: 138 mmol/L (ref 135–145)

## 2021-09-16 LAB — CBC
HCT: 34.2 % — ABNORMAL LOW (ref 36.0–46.0)
Hemoglobin: 11 g/dL — ABNORMAL LOW (ref 12.0–15.0)
MCH: 27.5 pg (ref 26.0–34.0)
MCHC: 32.2 g/dL (ref 30.0–36.0)
MCV: 85.5 fL (ref 80.0–100.0)
Platelets: 355 10*3/uL (ref 150–400)
RBC: 4 MIL/uL (ref 3.87–5.11)
RDW: 13.1 % (ref 11.5–15.5)
WBC: 8.2 10*3/uL (ref 4.0–10.5)
nRBC: 0 % (ref 0.0–0.2)

## 2021-09-16 NOTE — ED Triage Notes (Signed)
Pt to ED reporting heavy vaginal bleeding throughout the day. Pt passed a "baseball" sized cloth this morning and has since been saturating a pad every 2 hours with cramping and pain since. Pt delivered a baby last week and has had vaginal bleeding since.

## 2021-09-17 ENCOUNTER — Ambulatory Visit: Admitting: Certified Registered Nurse Anesthetist

## 2021-09-17 ENCOUNTER — Other Ambulatory Visit: Payer: Self-pay

## 2021-09-17 ENCOUNTER — Encounter: Payer: Self-pay | Admitting: Obstetrics and Gynecology

## 2021-09-17 ENCOUNTER — Encounter: Payer: Self-pay | Admitting: Certified Nurse Midwife

## 2021-09-17 ENCOUNTER — Ambulatory Visit (INDEPENDENT_AMBULATORY_CARE_PROVIDER_SITE_OTHER): Admitting: Obstetrics and Gynecology

## 2021-09-17 ENCOUNTER — Ambulatory Visit
Admission: RE | Admit: 2021-09-17 | Discharge: 2021-09-17 | Disposition: A | Discharge status: Home / Self Care | Source: Ambulatory Visit | Attending: Obstetrics and Gynecology | Admitting: Obstetrics and Gynecology

## 2021-09-17 ENCOUNTER — Encounter
Admission: RE | Disposition: A | Discharge status: Home / Self Care | Payer: Self-pay | Source: Ambulatory Visit | Attending: Obstetrics and Gynecology

## 2021-09-17 DIAGNOSIS — R42 Dizziness and giddiness: Secondary | ICD-10-CM | POA: Insufficient documentation

## 2021-09-17 DIAGNOSIS — R9389 Abnormal findings on diagnostic imaging of other specified body structures: Secondary | ICD-10-CM | POA: Insufficient documentation

## 2021-09-17 HISTORY — PX: DILATION AND CURETTAGE OF UTERUS: SHX78

## 2021-09-17 LAB — TYPE AND SCREEN
ABO/RH(D): A POS
Antibody Screen: NEGATIVE

## 2021-09-17 SURGERY — DILATION AND CURETTAGE
Anesthesia: General | Site: Vagina

## 2021-09-17 MED ORDER — LACTATED RINGERS IV SOLN: INTRAVENOUS | Status: DC

## 2021-09-17 MED ORDER — ONDANSETRON HCL 4 MG/2ML IJ SOLN
INTRAMUSCULAR | Status: DC | PRN
  Administered 2021-09-17: 4 mg via INTRAVENOUS

## 2021-09-17 MED ORDER — ONDANSETRON HCL 4 MG/2ML IJ SOLN
INTRAMUSCULAR | Status: AC
  Filled 2021-09-17: qty 2

## 2021-09-17 MED ORDER — ONDANSETRON 4 MG PO TBDP
4.0000 mg | ORAL_TABLET | Freq: Once | ORAL | Status: AC
  Administered 2021-09-17: 01:00:00 4 mg via ORAL
  Filled 2021-09-17: qty 1

## 2021-09-17 MED ORDER — CHLORHEXIDINE GLUCONATE 0.12 % MT SOLN: 15.0000 mL | Freq: Once | OROMUCOSAL | Status: DC

## 2021-09-17 MED ORDER — MIDAZOLAM HCL 2 MG/2ML IJ SOLN
INTRAMUSCULAR | Status: AC
  Filled 2021-09-17: qty 2

## 2021-09-17 MED ORDER — METHYLERGONOVINE MALEATE 0.2 MG/ML IJ SOLN
INTRAMUSCULAR | Status: AC
  Filled 2021-09-17: qty 1

## 2021-09-17 MED ORDER — MIDAZOLAM HCL 2 MG/2ML IJ SOLN
INTRAMUSCULAR | Status: DC | PRN
  Administered 2021-09-17: 2 mg via INTRAVENOUS

## 2021-09-17 MED ORDER — PROPOFOL 10 MG/ML IV BOLUS
INTRAVENOUS | Status: DC | PRN
  Administered 2021-09-17: 140 mg via INTRAVENOUS

## 2021-09-17 MED ORDER — DEXAMETHASONE SODIUM PHOSPHATE 10 MG/ML IJ SOLN
INTRAMUSCULAR | Status: DC | PRN
  Administered 2021-09-17: 10 mg via INTRAVENOUS

## 2021-09-17 MED ORDER — MISOPROSTOL 200 MCG PO TABS
800.0000 ug | ORAL_TABLET | ORAL | Status: AC
  Administered 2021-09-17: 01:00:00 800 ug via ORAL
  Filled 2021-09-17: qty 4

## 2021-09-17 MED ORDER — PROPOFOL 10 MG/ML IV BOLUS
INTRAVENOUS | Status: AC
  Filled 2021-09-17: qty 20

## 2021-09-17 MED ORDER — 0.9 % SODIUM CHLORIDE (POUR BTL) OPTIME
TOPICAL | Status: DC | PRN
  Administered 2021-09-17: 20:00:00 5 mL

## 2021-09-17 MED ORDER — METHYLERGONOVINE MALEATE 0.2 MG/ML IJ SOLN
INTRAMUSCULAR | Status: DC | PRN
  Administered 2021-09-17: .2 mg via INTRAMUSCULAR

## 2021-09-17 MED ORDER — METHYLERGONOVINE MALEATE 0.2 MG PO TABS: 0.2000 mg | ORAL_TABLET | Freq: Four times a day (QID) | ORAL | 0 refills | Status: AC

## 2021-09-17 MED ORDER — ORAL CARE MOUTH RINSE: 15.0000 mL | Freq: Once | OROMUCOSAL | Status: DC

## 2021-09-17 MED ORDER — LIDOCAINE HCL (CARDIAC) PF 100 MG/5ML IV SOSY
PREFILLED_SYRINGE | INTRAVENOUS | Status: DC | PRN
  Administered 2021-09-17: 80 mg via INTRAVENOUS

## 2021-09-17 MED ORDER — FENTANYL CITRATE (PF) 100 MCG/2ML IJ SOLN
INTRAMUSCULAR | Status: AC
  Administered 2021-09-17: 20:00:00 25 ug via INTRAVENOUS
  Filled 2021-09-17: qty 2

## 2021-09-17 MED ORDER — SUCCINYLCHOLINE CHLORIDE 200 MG/10ML IV SOSY
PREFILLED_SYRINGE | INTRAVENOUS | Status: DC | PRN
  Administered 2021-09-17: 100 mg via INTRAVENOUS

## 2021-09-17 MED ORDER — FENTANYL CITRATE (PF) 100 MCG/2ML IJ SOLN
INTRAMUSCULAR | Status: AC
  Filled 2021-09-17: qty 2

## 2021-09-17 MED ORDER — FENTANYL CITRATE (PF) 100 MCG/2ML IJ SOLN
INTRAMUSCULAR | Status: DC | PRN
  Administered 2021-09-17 (×2): 50 ug via INTRAVENOUS

## 2021-09-17 MED ORDER — LIDOCAINE HCL (PF) 1 % IJ SOLN
INTRAMUSCULAR | Status: AC
  Filled 2021-09-17: qty 30

## 2021-09-17 MED ORDER — ONDANSETRON HCL 4 MG/2ML IJ SOLN
4.0000 mg | Freq: Once | INTRAMUSCULAR | Status: AC | PRN
  Administered 2021-09-17: 20:00:00 4 mg via INTRAVENOUS

## 2021-09-17 MED ORDER — ONDANSETRON 4 MG PO TBDP: ORAL_TABLET | ORAL | 0 refills | Status: DC

## 2021-09-17 MED ORDER — METHYLERGONOVINE MALEATE 0.2 MG/ML IJ SOLN
0.2000 mg | Freq: Once | INTRAMUSCULAR | Status: DC | PRN
  Filled 2021-09-17: qty 1

## 2021-09-17 MED ORDER — FENTANYL CITRATE (PF) 100 MCG/2ML IJ SOLN
25.0000 ug | INTRAMUSCULAR | Status: DC | PRN
  Administered 2021-09-17: 20:00:00 25 ug via INTRAVENOUS

## 2021-09-17 MED ORDER — OXYCODONE-ACETAMINOPHEN 5-325 MG PO TABS
2.0000 | ORAL_TABLET | Freq: Once | ORAL | Status: DC
  Filled 2021-09-17: qty 2

## 2021-09-17 SURGICAL SUPPLY — 22 items
BASIN GRAD PLASTIC 32OZ STRL (MISCELLANEOUS) ×2 IMPLANT
DRSG TELFA 3X8 NADH (GAUZE/BANDAGES/DRESSINGS) ×9 IMPLANT
GAUZE 4X4 16PLY ~~LOC~~+RFID DBL (SPONGE) ×4 IMPLANT
GLOVE SURG ENC MOIS LTX SZ6.5 (GLOVE) ×7 IMPLANT
GLOVE SURG UNDER LTX SZ7 (GLOVE) ×7 IMPLANT
GOWN STRL REUS W/ TWL LRG LVL3 (GOWN DISPOSABLE) ×2 IMPLANT
GOWN STRL REUS W/TWL LRG LVL3 (GOWN DISPOSABLE) ×6
KIT TURNOVER CYSTO (KITS) ×3 IMPLANT
MANIFOLD NEPTUNE II (INSTRUMENTS) ×3 IMPLANT
NDL HYPO 25X1 1.5 SAFETY (NEEDLE) ×1 IMPLANT
NDL SPNL 25GX3.5 QUINCKE BL (NEEDLE) ×1 IMPLANT
NEEDLE HYPO 25X1 1.5 SAFETY (NEEDLE) ×3 IMPLANT
NEEDLE SPNL 25GX3.5 QUINCKE BL (NEEDLE) IMPLANT
NS IRRIG 500ML POUR BTL (IV SOLUTION) ×3 IMPLANT
PACK DNC HYST (MISCELLANEOUS) ×3 IMPLANT
PAD DRESSING TELFA 3X8 NADH (GAUZE/BANDAGES/DRESSINGS) ×1 IMPLANT
PAD OB MATERNITY 4.3X12.25 (PERSONAL CARE ITEMS) ×3 IMPLANT
PAD PREP 24X41 OB/GYN DISP (PERSONAL CARE ITEMS) ×3 IMPLANT
SCRUB EXIDINE 4% CHG 4OZ (MISCELLANEOUS) ×3 IMPLANT
SET CYSTO W/LG BORE CLAMP LF (SET/KITS/TRAYS/PACK) IMPLANT
SYR 10ML LL (SYRINGE) ×3 IMPLANT
WATER STERILE IRR 500ML POUR (IV SOLUTION) ×3 IMPLANT

## 2021-09-17 NOTE — Anesthesia Procedure Notes (Signed)
Procedure Name: Intubation Date/Time: 09/17/2021 6:53 PM Performed by: Joanette Gula, Peony Barner, CRNA Pre-anesthesia Checklist: Patient identified, Emergency Drugs available, Suction available and Patient being monitored Patient Re-evaluated:Patient Re-evaluated prior to induction Oxygen Delivery Method: Circle system utilized Preoxygenation: Pre-oxygenation with 100% oxygen Induction Type: IV induction and Rapid sequence Laryngoscope Size: McGraph and 3 Grade View: Grade I Tube type: Oral Tube size: 7.0 mm Number of attempts: 1 Airway Equipment and Method: Stylet Placement Confirmation: ETT inserted through vocal cords under direct vision, positive ETCO2 and breath sounds checked- equal and bilateral Secured at: 22 cm Tube secured with: Tape Dental Injury: Teeth and Oropharynx as per pre-operative assessment

## 2021-09-17 NOTE — Discharge Instructions (Signed)
As we discussed, your evaluation was generally reassuring, and you seem to be suffering from some ongoing bleeding but without any other severe complications.  At the recommendation of Dr. Valentino Saxon, we provided a medication called Cytotec 800 mg which should help you complete the bleeding process.  You may have some worsening cramping and passage of blood and clots once the medicine starts working in about 6 hours or so.  Please use the pain medication and you have at home, including but not limited to ibuprofen, Tylenol, and/or Percocet.  You are also provided a prescription for Zofran in case you began to suffer from nausea.  If you have not heard from [facility] by the middle of the morning, please call them and explain the situation and ask for a follow-up appointment later today.    Return to the emergency department if you develop new or worsening symptoms that concern you.

## 2021-09-17 NOTE — Op Note (Signed)
Procedure(s): DILATATION AND CURETTAGE Procedure Note  Christina Ruiz female 30 y.o. 09/17/2021  Indications: The patient is a 30 y.o. [phone] female with excessive postpartum bleeding, ~ 1 week s/p NSVD. Ultrasound performed showing thickened endometrium (29 mm) with concerns for retained products of conception.   Pre-operative Diagnosis: Excessive postpartum bleeding, thickened endometrium, possible retained products of conception  Post-operative Diagnosis: Same, with retained products of conception  Surgeon: Hildred Laser, MD  Assistants:  None  Anesthesia: General endotracheal anesthesia   Procedure Details: The patient was seen in the Holding Room. The risks, benefits, complications, treatment options, and expected outcomes were discussed with the patient.  The patient concurred with the proposed plan, giving informed consent.  The site of surgery properly noted/marked. The patient was taken to the Operating Room, identified as Christina Ruiz and the procedure verified as Procedure(s) (LRB): DILATATION AND CURETTAGE (N/A). A Time Out was held and the above information confirmed.  She was then placed under general anesthesia without difficulty. She was placed in the dorsal lithotomy position using candy cane stirrups, and was prepped and draped in a sterile manner.  A straight catheterization was performed. A sterile speculum was inserted into the vagina and the cervix was grasped at the anterior lip using a single-toothed tenaculum.  The uterus was sounded to 10 cm.  The smooth curette was then used to perform a curettage on the uterus.  Moderate amount of blood with clots noted, along with several fragments of products of conception, largest being approximately 2-3 cm in size. Next, a sharp curettage was performed to assess for any remaining fragments.  All specimens were collected and sent to pathology. The tenaculum was removed from the cervix with good hemostasis noted.  The speculum  was removed from the vagina. A dose of IM 0.2 mg of Methergine was administered at the conclusion of the case.   All instruments, needles, and sponge counts were correct x 2. The patient was taken to the recovery room awake, extubated and in stable condition.   Findings: The uterus was sounded to 10 Products of conception present  Estimated Blood Loss:  150 ml      Drains: straight catheterization prior to procedure with 100 ml of clear urine         Total IV Fluids:  300 ml  Specimens: Products of conception         Implants: None         Complications:  None; patient tolerated the procedure well.         Disposition: PACU - hemodynamically stable.         Condition: stable   Hildred Laser, MD [facility]
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
## 2021-09-17 NOTE — Progress Notes (Signed)
GYNECOLOGY PROGRESS NOTE  Subjective:    Patient ID: Christina Ruiz, female    DOB: 09/29/1990, 30 y.o.   MRN: [id number]  HPI  Patient is a 30 y.o. [phone] female who is 1 week s/p NSVD at [redacted] weeks gestation who has been having heavy vaginal bleeding for the past few days. She was seen in the Emergency Room last night due to persistent heavy vaginal bleeding.  Ultrasound noted thickened endometrium (29 mm).  Concern for retained products of conception.  Was given dose of Cytotec 800 mcg in the ER.  Patient feels symptoms of dizziness today.    The following portions of the patient's history were reviewed and updated as appropriate: allergies, current medications, past family history, past medical history, past social history, past surgical history, and problem list.  Review of Systems Pertinent items noted in HPI and remainder of comprehensive ROS otherwise negative.   Objective:  Blood pressure 120/82, pulse 94, temperature 98.6 F (37 C), height 5\' 3"  (1.6 m), weight 135 lb 9.6 oz (61.5 kg), currently breastfeeding.  Body mass index is 24.02 kg/m. General appearance: alert and no distress Abdomen: soft, non-tender; bowel sounds normal; no masses,  no organomegaly Pelvic:  deferred Extremities: extremities normal, atraumatic, no cyanosis or edema Neurologic: Grossly normal    Labs:  Admission on 09/16/2021, Discharged on 09/17/2021  Component Date Value Ref Range Status   WBC 09/16/2021 8.2  4.0 - 10.5 K/uL Final   RBC 09/16/2021 4.00  3.87 - 5.11 MIL/uL Final   Hemoglobin 09/16/2021 11.0 (L)  12.0 - 15.0 g/dL Final   HCT 09/18/2021 34.2 (L)  36.0 - 46.0 % Final   MCV 09/16/2021 85.5  80.0 - 100.0 fL Final   MCH 09/16/2021 27.5  26.0 - 34.0 pg Final   MCHC 09/16/2021 32.2  30.0 - 36.0 g/dL Final   RDW 09/18/2021 13.1  11.5 - 15.5 % Final   Platelets 09/16/2021 355  150 - 400 K/uL Final   nRBC 09/16/2021 0.0  0.0 - 0.2 % Final   Performed at [facility], [address] [place], [place]   Sodium 09/16/2021 138  135 - 145 mmol/L Final   Potassium 09/16/2021 4.0  3.5 - 5.1 mmol/L Final   Chloride 09/16/2021 109  98 - 111 mmol/L Final   CO2 09/16/2021 23  22 - 32 mmol/L Final   Glucose, Bld 09/16/2021 118 (H)  70 - 99 mg/dL Final   Glucose reference range applies only to samples taken after fasting for at least 8 hours.   BUN 09/16/2021 20  6 - 20 mg/dL Final   Creatinine, Ser 09/16/2021 0.67  0.44 - 1.00 mg/dL Final   Calcium 09/18/2021 9.0  8.9 - 10.3 mg/dL Final   GFR, Estimated 09/16/2021 >60  >60 mL/min Final   Comment: (NOTE) Calculated using the CKD-EPI Creatinine Equation (2021)    Anion gap 09/16/2021 6  5 - 15 Final   Performed at [facility], [address] [place], [place]     Imaging:  [id number] PELVIS (TRANSABDOMINAL ONLY) CLINICAL DATA:  Heavy vaginal bleeding post vaginal delivery 09/09/2021  EXAM: TRANSABDOMINAL ULTRASOUND OF PELVIS  TECHNIQUE: Transabdominal ultrasound examination of the pelvis was performed including evaluation of the uterus, ovaries, adnexal regions, and pelvic cul-de-sac.  COMPARISON:  None.  FINDINGS: Uterus  Measurements: 10.8 x 9.9 x 7.0 cm = volume: 394 mL. No fibroids or other mass visualized.  Endometrium  Thickness: 29 mm.  Heterogeneously thickened endometrium  Right ovary  Measurements: 1.9 x 1.5 x 1.4 cm = volume: 2 mL. Normal appearance/no adnexal mass.  Left ovary  Measurements: 2.1 x 1.5 x 1.2 cm = volume: 2 mL. Normal appearance/no adnexal mass.  Other findings:  No abnormal free fluid.  IMPRESSION: Heterogeneously thickened endometrium measuring up to 29 mm in thickness, which is a nonspecific finding but in the setting of recent vaginal delivery suspicious for retained products of conception.  Electronically Signed   By: Maudry Mayhew M.D.   On: 09/16/2021 22:48   Assessment:   Excessive postpartum bleeding Thickened  endometrium, cannot r/o retained products of conception  Plan:   Reviewed ultrasound, discussed that based on findings I was less concerned for retained products after reviewing delivery course, however in the setting of abnormal bleeding and thickened endometrium, as well as feeling some dizziness, she may benefit from a D&C.  Discussed risks/benefits of procedure and anticipated recovery.  Patient last ate around lunchtime. Will see if surgery can be scheduled for 6 pm this evening, or first thing in the morning. Last Hgb was reassuring from yesterday however with patient feeling some dizziness, would prefer proceeding as soon as possible.    A total of 20 minutes were spent face-to-face with the patient during this encounter and over half of that time dealt with counseling and coordination of care.   Hildred Laser, MD [facility]
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
## 2021-09-17 NOTE — Transfer of Care (Signed)
Immediate Anesthesia Transfer of Care Note  Patient: Christina Ruiz  Procedure(s) Performed: DILATATION AND CURETTAGE (Vagina )  Patient Location: PACU  Anesthesia Type:General  Level of Consciousness: awake and patient cooperative  Airway & Oxygen Therapy: Patient Spontanous Breathing  Post-op Assessment: Report given to RN and Post -op Vital signs reviewed and stable  Post vital signs: Reviewed and stable  Last Vitals:  Vitals Value Taken Time  BP 138/88 09/17/21 1941  Temp    Pulse 76 09/17/21 1943  Resp 18 09/17/21 1943  SpO2 100 % 09/17/21 1943  Vitals shown include unvalidated device data.  Last Pain:  Vitals:   09/17/21 1814  TempSrc: Temporal  PainSc: 6       Patients Stated Pain Goal: 0 (09/17/21 1814)  Complications: No notable events documented.

## 2021-09-17 NOTE — Discharge Instructions (Signed)
AMBULATORY SURGERY  ?DISCHARGE INSTRUCTIONS ? ? ?The drugs that you were given will stay in your system until tomorrow so for the next 24 hours you should not: ? ?Drive an automobile ?Make any legal decisions ?Drink any alcoholic beverage ? ? ?You may resume regular meals tomorrow.  Today it is better to start with liquids and gradually work up to solid foods. ? ?You may eat anything you prefer, but it is better to start with liquids, then soup and crackers, and gradually work up to solid foods. ? ? ?Please notify your doctor immediately if you have any unusual bleeding, trouble breathing, redness and pain at the surgery site, drainage, fever, or pain not relieved by medication. ? ? ? ?Additional Instructions: ? ? ? ?Please contact your physician with any problems or Same Day Surgery at [phone], Monday through Friday 6 am to 4 pm, or [facility] [place] [facility] number at [phone].  ?

## 2021-09-17 NOTE — Anesthesia Postprocedure Evaluation (Signed)
Anesthesia Post Note  Patient: Christina Ruiz  Procedure(s) Performed: DILATATION AND CURETTAGE (Vagina )  Patient location during evaluation: PACU Anesthesia Type: General Level of consciousness: awake and alert Pain management: pain level controlled Vital Signs Assessment: post-procedure vital signs reviewed and stable Respiratory status: spontaneous breathing, nonlabored ventilation, respiratory function stable and patient connected to nasal cannula oxygen Cardiovascular status: blood pressure returned to baseline and stable Postop Assessment: no apparent nausea or vomiting Anesthetic complications: no   No notable events documented.   Last Vitals:  Vitals:   09/17/21 2015 09/17/21 2022  BP: (!) 132/98 129/90  Pulse: 75 75  Resp: 15 20  Temp:  36.8 C  SpO2: 99% 98%    Last Pain:  Vitals:   09/17/21 2022  TempSrc:   PainSc: 3                  Yevette Edwards
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
## 2021-09-17 NOTE — Patient Instructions (Signed)
Dilation and Curettage or Vacuum Curettage Dilation and curettage (D&C) and vacuum curettage are minor procedures. A D&C involves stretching the cervix (dilation) and scraping the inside lining of the uterus, or endometrium, with surgical instruments (curettage). During a D&C, tissue is gently scraped starting from the top of the uterus down to the lowest part of the uterus. During a vacuum curettage, the lining and tissue in the uterus are removed using gentle suction. Curettage may be performed to either diagnose or treat a problem. A diagnostic curettage may be done if you have: Irregular bleeding or clotting from the uterus. Spotting between menstrual periods, prolonged menstrual periods, or other abnormal bleeding. Bleeding after menopause. No menstrual period (amenorrhea). A change in the size and shape of the uterus. Abnormal endometrial cells discovered during a Pap test. For treatment, curettage may be done: To remove an IUD (intrauterine device). To remove the remaining placenta after giving birth. During an abortion or after a miscarriage. To remove growths in the lining of the uterus. To remove certain rare types of noncancerous lumps (fibroids). Tell a health care provider about: Any allergies you have, including allergies to prescribed medicine or latex. All medicines you are taking, including vitamins, herbs, eye drops, creams, and over-the-counter medicines. Any problems you or family members have had with anesthetic medicines. Any blood disorders you have. Any surgeries you have had. Your medical history and any medical conditions you have. Whether you are pregnant or may be pregnant. Recent vaginal infections you have had. Recent menstrual periods, bleeding problems you have had, and what form of birth control (contraception) you use. What are the risks? Generally, this is a safe procedure. However, problems may occur, including: Infection. Heavy vaginal  bleeding. Allergic reactions to medicines. Damage to the cervix or nearby structures or organs. Scar tissue developing inside the uterus. This can cause abnormal periods and may make it harder to get pregnant. A hole (perforation) in the wall of the uterus. This is rare. What happens before the procedure? Staying hydrated Follow instructions from your health care provider about hydration, which may include: Up to 2 hours before the procedure - you may continue to drink clear liquids, such as water, clear fruit juice, black coffee, and plain tea.  Eating and drinking restrictions Follow instructions from your health care provider about eating and drinking, which may include: 8 hours before the procedure - stop eating heavy meals or foods, such as meat, fried foods, or fatty foods. 6 hours before the procedure - stop eating light meals or foods, such as toast or cereal. 6 hours before the procedure - stop drinking milk or drinks that contain milk. 2 hours before the procedure - stop drinking clear liquids. If your health care provider told you to take your medicine on the day of your procedure, take them with only a sip of water. Medicines Ask your health care provider about: Changing or stopping your regular medicines. This is especially important if you are taking diabetes medicines or blood thinners. Taking medicines such as aspirin and ibuprofen. These medicines can thin your blood. Do not take these medicines unless your health care provider tells you to take them. Taking over-the-counter medicines, vitamins, herbs, and supplements. You may be given a medicine to soften the cervix. This will help with dilation. Tests You may be given a pregnancy test on the day of the procedure. You may have a blood or urine sample taken. General instructions Do not use any products that contain nicotine or tobacco   for at least 4 weeks before the procedure. These products include cigarettes, chewing  tobacco, and vaping devices, such as e-cigarettes. If you need help quitting, ask your health care provider. For 24 hours before your procedure: Do not douche, use tampons, or have sex. Do not use medicines, creams, or suppositories in the vagina. Ask your health care provider what steps will be taken to help prevent infection. These may include: Removing hair at the procedure site. Washing skin with a germ-killing soap. Taking antibiotic medicine. Plan to have a responsible adult take you home from the hospital or clinic. If you will be going home right after the procedure, plan to have a responsible adult care for you for the time you are told. This is important. What happens during the procedure?  An IV will be inserted into one of your veins. You will be given one of the following: A medicine that numbs the area in and around the cervix (local anesthetic). A medicine to make you fall asleep (general anesthetic). You will lie down on your back, with your feet in foot rests (stirrups). The size and position of your uterus will be checked. A lubricated instrument (speculum or Sims retractor) will be inserted into your vagina to widen its walls. This will allow your health care provider to see your cervix. Your cervix will be softened and dilated. This may be done by: Taking medicine by mouth or vaginally. Having thin rods or gradual widening instruments inserted into your cervix. A small, sharp, curved instrument (curette) will be used to scrape a small amount of tissue or cells from the endometrium or cervical canal. In some cases, gentle suction is applied with the curette. The cells will be taken to a lab for testing. The procedure may vary among health care providers and hospitals. What happens after the procedure? Your blood pressure, heart rate, breathing rate, and blood oxygen level will be monitored until you leave the hospital or clinic. You may have mild cramping, a backache,  pain, and light bleeding or spotting. You may pass small blood clots from your vagina. You may have to wear compression stockings. These stockings help to prevent blood clots and reduce swelling in your legs. It is up to you to get the results of your procedure. Ask your health care provider, or the department that is doing the procedure, when your results will be ready. Summary Dilation and curettage (D&C) involves stretching (dilating) the cervix and scraping the inside lining of the uterus with surgical instruments (curettage). Follow your health care provider's instructions about when to stop eating and drinking before the procedure, and whether to stop or change any medicines. After the procedure, you may have mild cramping, a backache, pain, and light bleeding or spotting. You may pass small blood clots from your vagina. Plan to have a responsible adult take you home from the hospital or clinic. This information is not intended to replace advice given to you by your health care provider. Make sure you discuss any questions you have with your health care provider. Document Revised: 08/29/2020 Document Reviewed: 08/29/2020 Elsevier Patient Education  2022 [employer] Inc[employer]  

## 2021-09-18 ENCOUNTER — Encounter: Payer: Self-pay | Admitting: Obstetrics and Gynecology

## 2021-09-19 LAB — SURGICAL PATHOLOGY

## 2021-09-30 ENCOUNTER — Telehealth: Admitting: Certified Nurse Midwife

## 2021-10-02 ENCOUNTER — Telehealth: Payer: Self-pay | Admitting: Certified Nurse Midwife

## 2021-10-02 NOTE — Telephone Encounter (Signed)
Dr. Marlowe Sax called in requesting for Dr. Derrel Nip to call him. Dr. Marlowe Sax would like to speak with Dr. Derrel Nip so he can provide a hand off of information on the Pt. Dr. Marlowe Sax number is [phone]

## 2021-10-03 ENCOUNTER — Encounter: Payer: Self-pay | Admitting: Internal Medicine

## 2021-10-03 ENCOUNTER — Encounter: Admitting: Obstetrics and Gynecology

## 2021-10-03 DIAGNOSIS — G039 Meningitis, unspecified: Secondary | ICD-10-CM

## 2021-10-03 DIAGNOSIS — K625 Hemorrhage of anus and rectum: Secondary | ICD-10-CM | POA: Insufficient documentation

## 2021-10-03 HISTORY — DX: Meningitis, unspecified: G03.9

## 2021-10-04 ENCOUNTER — Encounter: Payer: Self-pay | Admitting: Anesthesiology

## 2021-10-04 NOTE — Telephone Encounter (Signed)
noted 

## 2021-10-08 ENCOUNTER — Telehealth: Payer: Self-pay | Admitting: Certified Nurse Midwife

## 2021-10-08 NOTE — Telephone Encounter (Signed)
Dr. Loel Ro from [facility] called in stating that Pt will be seeing Dr. Darrick Huntsman as a new patient on 10/28/2021 at 3:00pm. Dr. Loel Ro would like to know if Dr. Darrick Huntsman will prescribe Pt (oral) dilaudid to the Pt after she leaves the rehab facility. Dr. Loel Ro would like for doctor to call  him so he can provide an update on the pt. Doctor phone is [phone]

## 2021-10-08 NOTE — Telephone Encounter (Signed)
Dr. Loel Ro from [facility] called in stating that Pt will be seeing Dr. Darrick Huntsman as a new patient on 10/28/2021 at 3:00pm. Dr. Loel Ro would like to know if Dr. Darrick Huntsman will prescribe Pt (oral) dilaudid to the Pt after she leaves the rehab facility. Dr. Loel Ro would like for doctor to call  him so he can provide an update on the pt. Doctor phone is [phone].  Montrey Buist,cma

## 2021-10-14 NOTE — Telephone Encounter (Signed)
I called the provider Dr. Queen Slough and informed him that the provider stated she did not have a problem prescribing the Dilaudid for the patient but will not prescribe until her appointment on Feb, 6,2023. He stated that was fine he has her covered until that appointment date.  Amilcar Reever,cma

## 2021-10-21 ENCOUNTER — Ambulatory Visit (INDEPENDENT_AMBULATORY_CARE_PROVIDER_SITE_OTHER): Admitting: Certified Nurse Midwife

## 2021-10-21 ENCOUNTER — Other Ambulatory Visit: Payer: Self-pay

## 2021-10-21 ENCOUNTER — Encounter: Payer: Self-pay | Admitting: Certified Nurse Midwife

## 2021-10-21 NOTE — Progress Notes (Signed)
Subjective:    Christina Ruiz is a 31 y.o. [phone] Caucasian female who presents for a postpartum visit. She is  She is 6 weeks postpartum following a spontaneous vaginal delivery at 37.6 gestational weeks. Anesthesia: epidural. I have fully reviewed the prenatal and intrapartum course. Postpartum course has been complicated by headache and blood patch x 2, arachnoiditis, meningitis cause lower extremity weakness and inability to ambulate . Baby's course has been normal. Baby is feeding by breast. Bleeding no bleeding. Bowel function is normal. Bladder function is normal. Patient is not sexually active. Contraception method is  natural family planning, may consider caya. States that she does not do well with hormone use. Has done para guard in past that caused non stop bleeding . Postpartum depression screening: positive. Score 16.  Last pap 01/16/2021 and was negative.  The following portions of the patient's history were reviewed and updated as appropriate: allergies, current medications, past medical history, past surgical history and problem list.  Review of Systems Pertinent items are noted in HPI.   Vitals:   10/21/21 1031  BP: 107/72  Pulse: 98  Weight: 135 lb (61.2 kg)  Height: 5\' 3"  (1.6 m)   No LMP recorded (lmp unknown).  Objective:   General:  alert, cooperative and no distress   Breasts:  deferred, no complaints  Lungs: clear to auscultation bilaterally  Heart:  regular rate and rhythm  Abdomen: soft, nontender   Vulva: normal  Vagina: normal vagina  Cervix:  closed  Corpus: Well-involuted  Adnexa:  Non-palpable  Rectal Exam: no hemorrhoids        Assessment:   Postpartum exam 6 wks s/p SVD Breastfeeding Depression screening Contraception counseling   Plan:  :  natural family planning Follow up in: 6 months for annual or earlier if needed Discussed use of medication for management of anxiety/depression. Pt feels like she is doing better now that she is home and  declines medication at this time. She will let me know if this changes for her.   , CNM

## 2021-10-28 ENCOUNTER — Encounter: Payer: Self-pay | Admitting: Internal Medicine

## 2021-10-28 ENCOUNTER — Ambulatory Visit (INDEPENDENT_AMBULATORY_CARE_PROVIDER_SITE_OTHER): Admitting: Internal Medicine

## 2021-10-28 ENCOUNTER — Other Ambulatory Visit: Payer: Self-pay

## 2021-10-28 VITALS — BP 118/74 | HR 96 | Temp 98.4°F | Ht 63.0 in | Wt 136.0 lb

## 2021-10-28 DIAGNOSIS — D500 Iron deficiency anemia secondary to blood loss (chronic): Secondary | ICD-10-CM | POA: Diagnosis not present

## 2021-10-28 DIAGNOSIS — G039 Meningitis, unspecified: Secondary | ICD-10-CM

## 2021-10-28 MED ORDER — GABAPENTIN 100 MG PO CAPS: 100.0000 mg | ORAL_CAPSULE | Freq: Three times a day (TID) | ORAL | 3 refills | Status: DC

## 2021-10-28 NOTE — Patient Instructions (Signed)
I recommend scheduling 1000 mg tylenol every 12 hours for management of pain  Gabapentin 100 mg sent to [facility] to use if needed up to 3 times daily  Please ask Lennette Bihari to up date  me regularly,  and I will release you for return to work on a limited (4 hour) schedule as soon as you can demonstrate safe exit from your car with your children
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
## 2021-10-29 LAB — CBC WITH DIFFERENTIAL/PLATELET
Basophils Absolute: 0.1 10*3/uL (ref 0.0–0.1)
Basophils Relative: 0.9 % (ref 0.0–3.0)
Eosinophils Absolute: 0.2 10*3/uL (ref 0.0–0.7)
Eosinophils Relative: 2.4 % (ref 0.0–5.0)
HCT: 37.2 % (ref 36.0–46.0)
Hemoglobin: 11.9 g/dL — ABNORMAL LOW (ref 12.0–15.0)
Lymphocytes Relative: 39.9 % (ref 12.0–46.0)
Lymphs Abs: 3.3 10*3/uL (ref 0.7–4.0)
MCHC: 32 g/dL (ref 30.0–36.0)
MCV: 85.6 fl (ref 78.0–100.0)
Monocytes Absolute: 1 10*3/uL (ref 0.1–1.0)
Monocytes Relative: 11.8 % (ref 3.0–12.0)
Neutro Abs: 3.7 10*3/uL (ref 1.4–7.7)
Neutrophils Relative %: 45 % (ref 43.0–77.0)
Platelets: 438 10*3/uL — ABNORMAL HIGH (ref 150.0–400.0)
RBC: 4.34 Mil/uL (ref 3.87–5.11)
RDW: 18.2 % — ABNORMAL HIGH (ref 11.5–15.5)
WBC: 8.2 10*3/uL (ref 4.0–10.5)

## 2021-10-29 LAB — IRON,TIBC AND FERRITIN PANEL
%SAT: 25 % (calc) (ref 16–45)
Ferritin: 133 ng/mL (ref 16–154)
Iron: 77 ug/dL (ref 40–190)
TIBC: 314 mcg/dL (calc) (ref 250–450)

## 2021-10-29 NOTE — Assessment & Plan Note (Signed)
She has full ROM of neck without significant pain,  And excellent distal LE strength.  Recommend she resume driving with mother or husband accompanying her initially in some parking lot driving . Return to work reduced schedule will be recommended once she can demonstrate to home PT that she can manage getting in and  Out of car to house with 3 children (including her newborn)

## 2021-11-04 ENCOUNTER — Telehealth: Payer: Self-pay | Admitting: Internal Medicine

## 2021-11-04 ENCOUNTER — Telehealth (INDEPENDENT_AMBULATORY_CARE_PROVIDER_SITE_OTHER): Admitting: Family Medicine

## 2021-11-04 ENCOUNTER — Telehealth: Payer: Self-pay | Admitting: Certified Nurse Midwife

## 2021-11-04 ENCOUNTER — Encounter: Payer: Self-pay | Admitting: Family Medicine

## 2021-11-04 VITALS — Temp 102.3°F | Ht 63.0 in | Wt 135.0 lb

## 2021-11-04 DIAGNOSIS — Z20818 Contact with and (suspected) exposure to other bacterial communicable diseases: Secondary | ICD-10-CM

## 2021-11-04 DIAGNOSIS — J069 Acute upper respiratory infection, unspecified: Secondary | ICD-10-CM

## 2021-11-04 DIAGNOSIS — J029 Acute pharyngitis, unspecified: Secondary | ICD-10-CM | POA: Diagnosis not present

## 2021-11-04 MED ORDER — AMOXICILLIN 500 MG PO TABS: 500.0000 mg | ORAL_TABLET | Freq: Two times a day (BID) | ORAL | 0 refills | Status: AC

## 2021-11-04 NOTE — Telephone Encounter (Deleted)
Pt thinks she has strep. Pt has a sore throat and it hurts to swallow; 102 fever, chills, body aches since yesterday sent to access nurse

## 2021-11-04 NOTE — Telephone Encounter (Signed)
error 

## 2021-11-04 NOTE — Telephone Encounter (Signed)
LMTCB

## 2021-11-04 NOTE — Telephone Encounter (Signed)
Pt thinks she has strep. Pt has a sore throat and it hurts to swallow; 102 fever, chills, body aches since yesterday sent to access nurse °
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
## 2021-11-05 NOTE — Telephone Encounter (Signed)
Pt had a virtual visit yesterday.

## 2021-11-06 NOTE — Telephone Encounter (Signed)
Pt returning call

## 2021-11-07 NOTE — Telephone Encounter (Signed)
Attempted to call pt and got a message stating the call can not be completed at this time.

## 2021-11-25 ENCOUNTER — Encounter: Payer: Self-pay | Admitting: Internal Medicine

## 2021-11-25 ENCOUNTER — Ambulatory Visit (INDEPENDENT_AMBULATORY_CARE_PROVIDER_SITE_OTHER): Admitting: Internal Medicine

## 2021-11-25 ENCOUNTER — Other Ambulatory Visit: Payer: Self-pay

## 2021-11-25 VITALS — BP 106/78 | HR 65 | Temp 97.5°F | Ht 63.0 in | Wt 142.8 lb

## 2021-11-25 DIAGNOSIS — E538 Deficiency of other specified B group vitamins: Secondary | ICD-10-CM

## 2021-11-25 DIAGNOSIS — G039 Meningitis, unspecified: Secondary | ICD-10-CM

## 2021-11-25 DIAGNOSIS — K589 Irritable bowel syndrome without diarrhea: Secondary | ICD-10-CM | POA: Insufficient documentation

## 2021-11-25 DIAGNOSIS — K591 Functional diarrhea: Secondary | ICD-10-CM

## 2021-11-25 DIAGNOSIS — E611 Iron deficiency: Secondary | ICD-10-CM

## 2021-11-25 DIAGNOSIS — R5383 Other fatigue: Secondary | ICD-10-CM

## 2021-11-25 DIAGNOSIS — K580 Irritable bowel syndrome with diarrhea: Secondary | ICD-10-CM

## 2021-11-25 MED ORDER — CYANOCOBALAMIN 1000 MCG/ML IJ SOLN
1000.0000 ug | Freq: Once | INTRAMUSCULAR | Status: AC
  Administered 2021-11-25: 1000 ug via INTRAMUSCULAR

## 2021-11-25 NOTE — Assessment & Plan Note (Signed)
Etiology likely due to returning to work  full time following an extended hospitalization for arachnoiditis and having 3 children < 5 at home. .  Will screen for thyroid, anemia, b12 deficiency ,  hepatic and renal insufficiency, encourage regular exercise 5 days /week ?

## 2021-11-25 NOTE — Assessment & Plan Note (Signed)
She is breastfeeding so hyoscyamine is C/I.  Imodium in doses limited to 1/2 tablet daily ok.  Trial of IBGARD and low FODMAP diet handout given.  ?

## 2021-11-25 NOTE — Patient Instructions (Addendum)
I recommend a trial of IBGARD for your IBS .  It is available through [employer].  It contains oil of peppermint  and is safe for use in breastfeeding  ? ?If you  have to use imodium to manage the loose stool  ,  limit use to 1/2 tablet daily  ? ? ? ? ?

## 2021-11-25 NOTE — Assessment & Plan Note (Signed)
Resolved lower extremity weakness . Has returned to work full time  ?

## 2021-11-25 NOTE — Progress Notes (Signed)
? ?Subjective:  ?Patient ID: Lonia Roane, female    DOB: [birth year]  Age: 31 y.o. MRN: [id number] ? ?CC: The primary encounter diagnosis was Fatigue, unspecified type. Diagnoses of Iron deficiency, Functional diarrhea, Irritable bowel syndrome with diarrhea, Other fatigue, Arachnoiditis, and Vitamin B12 deficiency were also pertinent to this visit. ? ? ?This visit occurred during the SARS-CoV-2 public health emergency.  Safety protocols were in place, including screening questions prior to the visit, additional usage of staff PPE, and extensive cleaning of exam room while observing appropriate contact time as indicated for disinfecting solutions.   ? ?HPI ?Buelah Rennie presents for  ?Chief Complaint  ?Patient presents with  ? Follow-up  ?  1 month follow up   ? ?1) arachnoiditis:  returned to work on feb 20 after making remarkable improvement per home PT .  She is no longer using a walker.  She is working full time but very fatigued once home  ? ?2) IDA:  resolved by Feb labs  ? ?3) strep throat history : symptoms resolved.  ? ?4) post prandial loose stools after every meals . Occasionally sees undigested food in stools  diagnosed with IBS years ago , frequently has cramping that resolves with BM's  and gas  ? ? ?Outpatient Medications Prior to Visit  ?Medication Sig Dispense Refill  ? acetaminophen (TYLENOL) 325 MG tablet Take 650 mg by mouth every 6 (six) hours as needed.    ? gabapentin (NEURONTIN) 100 MG capsule Take 1 capsule (100 mg total) by mouth 3 (three) times daily. 90 capsule 3  ? HYDROmorphone (DILAUDID) 4 MG tablet Take by mouth.    ? methocarbamol (ROBAXIN) 500 MG tablet Take 500 mg by mouth daily.    ? ?No facility-administered medications prior to visit.  ? ? ?Review of Systems; ? ?Patient denies headache, fevers, malaise, unintentional weight loss, skin rash, eye pain, sinus congestion and sinus pain, sore throat, dysphagia,  hemoptysis , cough, dyspnea, wheezing, chest pain, palpitations,  orthopnea, edema, abdominal pain, nausea, melena, diarrhea, constipation, flank pain, dysuria, hematuria, urinary  Frequency, nocturia, numbness, tingling, seizures,  Focal weakness, Loss of consciousness,  Tremor, insomnia, depression, anxiety, and suicidal ideation.   ? ? ? ?Objective:  ?BP 106/78 (BP Location: Left Arm, Patient Position: Sitting, Cuff Size: Normal)   Pulse 65   Temp (!) 97.5 ?F (36.4 ?C) (Oral)   Ht 5\' 3"  (1.6 m)   Wt 142 lb 12.8 oz (64.8 kg)   SpO2 99%   BMI 25.30 kg/m?  ? ?BP Readings from Last 3 Encounters:  ?11/25/21 106/78  ?10/28/21 118/74  ?10/21/21 107/72  ? ? ?Wt Readings from Last 3 Encounters:  ?11/25/21 142 lb 12.8 oz (64.8 kg)  ?11/04/21 135 lb (61.2 kg)  ?10/28/21 136 lb (61.7 kg)  ? ? ?General appearance: alert, cooperative and appears stated age ?Ears: normal TM's and external ear canals both ears ?Throat: lips, mucosa, and tongue normal; teeth and gums normal ?Neck: no adenopathy, no carotid bruit, supple, symmetrical, trachea midline and thyroid not enlarged, symmetric, no tenderness/mass/nodules ?Back: symmetric, no curvature. ROM normal. No CVA tenderness. ?Lungs: clear to auscultation bilaterally ?Heart: regular rate and rhythm, S1, S2 normal, no murmur, click, rub or gallop ?Abdomen: soft, non-tender; bowel sounds normal; no masses,  no organomegaly ?Pulses: 2+ and symmetric ?Skin: Skin color, texture, turgor normal. No rashes or lesions ?Lymph nodes: Cervical, supraclavicular, and axillary nodes normal. ? ?No results found for: HGBA1C ? ?Lab Results  ?Component Value Date  ?  CREATININE 0.67 09/16/2021  ? CREATININE 0.64 08/14/2021  ? CREATININE 0.68 03/16/2021  ? ? ?Lab Results  ?Component Value Date  ? WBC 8.2 10/28/2021  ? HGB 11.9 (L) 10/28/2021  ? HCT 37.2 10/28/2021  ? PLT 438.0 (H) 10/28/2021  ? GLUCOSE 118 (H) 09/16/2021  ? CHOL 163 01/16/2021  ? TRIG 51 01/16/2021  ? HDL 64 01/16/2021  ? LDLCALC 88 01/16/2021  ? ALT 31 08/14/2021  ? AST 29 08/14/2021  ? NA 138  09/16/2021  ? K 4.0 09/16/2021  ? CL 109 09/16/2021  ? CREATININE 0.67 09/16/2021  ? BUN 20 09/16/2021  ? CO2 23 09/16/2021  ? INR 1.16 02/23/2016  ? ? ?No results found. ? ?Assessment & Plan:  ? ?Problem List Items Addressed This Visit   ? ? Arachnoiditis  ?  Resolved lower extremity weakness . Has returned to work full time  ?  ?  ? Fatigue - Primary  ?  Etiology likely due to returning to work  full time following an extended hospitalization for arachnoiditis and having 3 children < 5 at home. .  Will screen for thyroid, anemia, b12 deficiency ,  hepatic and renal insufficiency, encourage regular exercise 5 days /week ?  ?  ? Relevant Orders  ? Thyroid Panel With TSH  ? Vitamin B12  ? IBC + Ferritin  ? IBS (irritable bowel syndrome)  ?  She is breastfeeding so hyoscyamine is C/I.  Imodium in doses limited to 1/2 tablet daily ok.  Trial of IBGARD and low FODMAP diet handout given.  ?  ?  ? ?Other Visit Diagnoses   ? ? Iron deficiency      ? Relevant Orders  ? CBC with Differential/Platelet  ? IBC + Ferritin  ? Functional diarrhea      ? Relevant Orders  ? Sedimentation rate  ? C-reactive protein  ? Comprehensive metabolic panel  ? Vitamin B12 deficiency      ? Relevant Medications  ? cyanocobalamin ((VITAMIN B-12)) injection 1,000 mcg (Completed)  ? ?  ? ? ?I spent 30 minutes dedicated to the care of this patient on the date of this encounter to include pre-visit review of patient's medical history,  most recent imaging studies, Face-to-face time with the patient , and post visit ordering of testing and therapeutics.   ? ?Follow-up: No follow-ups on file. ? ? ?Sherlene Shams, MD ?

## 2021-11-26 ENCOUNTER — Encounter: Payer: Self-pay | Admitting: Internal Medicine

## 2021-11-26 LAB — COMPREHENSIVE METABOLIC PANEL
ALT: 19 U/L (ref 0–35)
AST: 19 U/L (ref 0–37)
Albumin: 4.2 g/dL (ref 3.5–5.2)
Alkaline Phosphatase: 54 U/L (ref 39–117)
BUN: 17 mg/dL (ref 6–23)
CO2: 26 mEq/L (ref 19–32)
Calcium: 9 mg/dL (ref 8.4–10.5)
Chloride: 105 mEq/L (ref 96–112)
Creatinine, Ser: 0.73 mg/dL (ref 0.40–1.20)
GFR: 111.88 mL/min (ref >60.00)
Glucose, Bld: 55 mg/dL — ABNORMAL LOW (ref 70–99)
Potassium: 3.8 mEq/L (ref 3.5–5.1)
Sodium: 140 mEq/L (ref 135–145)
Total Bilirubin: 0.4 mg/dL (ref 0.2–1.2)
Total Protein: 6.3 g/dL (ref 6.0–8.3)

## 2021-11-26 LAB — CBC WITH DIFFERENTIAL/PLATELET
Basophils Absolute: 0.1 10*3/uL (ref 0.0–0.1)
Basophils Relative: 1.9 % (ref 0.0–3.0)
Eosinophils Absolute: 0.1 10*3/uL (ref 0.0–0.7)
Eosinophils Relative: 1.7 % (ref 0.0–5.0)
HCT: 33.4 % — ABNORMAL LOW (ref 36.0–46.0)
Hemoglobin: 11.1 g/dL — ABNORMAL LOW (ref 12.0–15.0)
Lymphocytes Relative: 52.8 % — ABNORMAL HIGH (ref 12.0–46.0)
Lymphs Abs: 2.9 10*3/uL (ref 0.7–4.0)
MCHC: 33.4 g/dL (ref 30.0–36.0)
MCV: 87.3 fl (ref 78.0–100.0)
Monocytes Absolute: 0.4 10*3/uL (ref 0.1–1.0)
Monocytes Relative: 6.6 % (ref 3.0–12.0)
Neutro Abs: 2 10*3/uL (ref 1.4–7.7)
Neutrophils Relative %: 37 % — ABNORMAL LOW (ref 43.0–77.0)
Platelets: 357 10*3/uL (ref 150.0–400.0)
RBC: 3.82 Mil/uL — ABNORMAL LOW (ref 3.87–5.11)
RDW: 17.8 % — ABNORMAL HIGH (ref 11.5–15.5)
WBC: 5.4 10*3/uL (ref 4.0–10.5)

## 2021-11-26 LAB — VITAMIN B12: Vitamin B-12: 463 pg/mL (ref 211–911)

## 2021-11-26 LAB — C-REACTIVE PROTEIN: CRP: <1 mg/dL (ref 0.5–20.0)

## 2021-11-26 LAB — IBC + FERRITIN
Ferritin: 57.6 ng/mL (ref 10.0–291.0)
Iron: 62 ug/dL (ref 42–145)
Saturation Ratios: 23.1 % (ref 20.0–50.0)
TIBC: 268.8 ug/dL (ref 250.0–450.0)
Transferrin: 192 mg/dL — ABNORMAL LOW (ref 212.0–360.0)

## 2021-11-26 LAB — SEDIMENTATION RATE: Sed Rate: 3 mm/hr (ref 0–20)

## 2021-11-26 NOTE — Addendum Note (Signed)
Addended by: Crecencio Mc on: 11/26/2021 01:08 PM ? ? Modules accepted: Orders ? ?

## 2021-12-06 LAB — FETAL NONSTRESS TEST

## 2021-12-30 ENCOUNTER — Encounter: Payer: Self-pay | Admitting: Internal Medicine

## 2021-12-30 ENCOUNTER — Ambulatory Visit (INDEPENDENT_AMBULATORY_CARE_PROVIDER_SITE_OTHER): Admitting: Internal Medicine

## 2021-12-30 DIAGNOSIS — R5383 Other fatigue: Secondary | ICD-10-CM

## 2021-12-30 DIAGNOSIS — E611 Iron deficiency: Secondary | ICD-10-CM

## 2021-12-30 DIAGNOSIS — D649 Anemia, unspecified: Secondary | ICD-10-CM | POA: Diagnosis not present

## 2021-12-30 MED ORDER — "SYRINGE 25G X 1"" 3 ML MISC": 0 refills | Status: DC

## 2021-12-30 MED ORDER — CYANOCOBALAMIN 1000 MCG/ML IJ SOLN: 1000.0000 ug | INTRAMUSCULAR | 2 refills | Status: DC

## 2021-12-30 NOTE — Assessment & Plan Note (Addendum)
Mild,  Rechecking with iron studies z; ? ?Ferritin ins > 30 and TSAT is 43%..  Not indicative of iron deficiency ?

## 2021-12-30 NOTE — Progress Notes (Addendum)
? ?Subjective:  ?Patient ID: Christina Ruiz, female    DOB: 1991-07-07  Age: 31 y.o. MRN: [id number] ? ?CC: Diagnoses of Iron deficiency, Other fatigue, and Anemia, unspecified type were pertinent to this visit. ? ? ?This visit occurred during the SARS-CoV-2 public health emergency.  Safety protocols were in place, including screening questions prior to the visit, additional usage of staff PPE, and extensive cleaning of exam room while observing appropriate contact time as indicated for disinfecting solutions.   ? ?HPI ?Christina Ruiz presents for follow up on fatigue ?Chief Complaint  ?Patient presents with  ? Follow-up  ?  1 month follow up   ? ?1) Fatigue:  following prolonged hospitalization for arachnoiditis several months ago (complication of epidural ), reentry into full time work, working on an [employer] , and 3 young children.  Screening labs were normal except for a mild drop in hgb.  Symptoms improved transiently with b12 injection . Sleep averages 6 hours  can't fall asleep until 12  ? ?Lab Results  ?Component Value Date  ? WBC 5.3 12/30/2021  ? HGB 12.5 12/30/2021  ? HCT 37.6 12/30/2021  ? MCV 87.0 12/30/2021  ? PLT 319.0 12/30/2021  ? ? ? ? ? ? ?Outpatient Medications Prior to Visit  ?Medication Sig Dispense Refill  ? acetaminophen (TYLENOL) 325 MG tablet Take 650 mg by mouth every 6 (six) hours as needed.    ? gabapentin (NEURONTIN) 100 MG capsule Take 1 capsule (100 mg total) by mouth 3 (three) times daily. (Patient taking differently: Take 100 mg by mouth as needed.) 90 capsule 3  ? HYDROmorphone (DILAUDID) 4 MG tablet Take 4 mg by mouth as needed.    ? methocarbamol (ROBAXIN) 500 MG tablet Take 500 mg by mouth as needed.    ? FLUoxetine (PROZAC) 20 MG/5ML solution Take by mouth. (Patient not taking: Reported on 12/30/2021)    ? ?No facility-administered medications prior to visit.  ? ? ?Review of Systems; ? ?Patient denies headache, fevers, malaise, unintentional weight loss, skin rash, eye pain, sinus  congestion and sinus pain, sore throat, dysphagia,  hemoptysis , cough, dyspnea, wheezing, chest pain, palpitations, orthopnea, edema, abdominal pain, nausea, melena, diarrhea, constipation, flank pain, dysuria, hematuria, urinary  Frequency, nocturia, numbness, tingling, seizures,  Focal weakness, Loss of consciousness,  Tremor, insomnia, depression, anxiety, and suicidal ideation.   ? ? ? ?Objective:  ?BP 108/72 (BP Location: Left Arm, Patient Position: Sitting, Cuff Size: Normal)   Pulse 88   Temp 98.1 ?F (36.7 ?C) (Oral)   Ht 5\' 3"  (1.6 m)   Wt 141 lb 3.2 oz (64 kg)   SpO2 98%   BMI 25.01 kg/m?  ? ?BP Readings from Last 3 Encounters:  ?12/30/21 108/72  ?11/25/21 106/78  ?10/28/21 118/74  ? ? ?Wt Readings from Last 3 Encounters:  ?12/30/21 141 lb 3.2 oz (64 kg)  ?11/25/21 142 lb 12.8 oz (64.8 kg)  ?11/04/21 135 lb (61.2 kg)  ? ? ?General appearance: alert, cooperative and appears stated age ?Ears: normal TM's and external ear canals both ears ?Throat: lips, mucosa, and tongue normal; teeth and gums normal ?Neck: no adenopathy, no carotid bruit, supple, symmetrical, trachea midline and thyroid not enlarged, symmetric, no tenderness/mass/nodules ?Back: symmetric, no curvature. ROM normal. No CVA tenderness. ?Lungs: clear to auscultation bilaterally ?Heart: regular rate and rhythm, S1, S2 normal, no murmur, click, rub or gallop ?Abdomen: soft, non-tender; bowel sounds normal; no masses,  no organomegaly ?Pulses: 2+ and symmetric ?Skin: Skin color, texture, turgor  normal. No rashes or lesions ?Lymph nodes: Cervical, supraclavicular, and axillary nodes normal. ? ?No results found for: HGBA1C ? ?Lab Results  ?Component Value Date  ? CREATININE 0.73 11/25/2021  ? CREATININE 0.67 09/16/2021  ? CREATININE 0.64 08/14/2021  ? ? ?Lab Results  ?Component Value Date  ? WBC 5.3 12/30/2021  ? HGB 12.5 12/30/2021  ? HCT 37.6 12/30/2021  ? PLT 319.0 12/30/2021  ? GLUCOSE 55 (L) 11/25/2021  ? CHOL 163 01/16/2021  ? TRIG 51  01/16/2021  ? HDL 64 01/16/2021  ? LDLCALC 88 01/16/2021  ? ALT 19 11/25/2021  ? AST 19 11/25/2021  ? NA 140 11/25/2021  ? K 3.8 11/25/2021  ? CL 105 11/25/2021  ? CREATININE 0.73 11/25/2021  ? BUN 17 11/25/2021  ? CO2 26 11/25/2021  ? INR 1.16 02/23/2016  ? ? ?No results found. ? ?Assessment & Plan:  ? ?Problem List Items Addressed This Visit   ? ? Fatigue  ?  Secondary to overworked state combined with mild anemia.  Continue b12 injections weekly  ?  ?  ? Anemia  ?  Mild,  Rechecking with iron studies z; ? ?Ferritin ins > 30 and TSAT is 43%..  Not indicative of iron deficiency ?  ?  ? Relevant Medications  ? cyanocobalamin (,VITAMIN B-12,) 1000 MCG/ML injection  ? ?Other Visit Diagnoses   ? ? Iron deficiency      ? Relevant Orders  ? IBC + Ferritin (Completed)  ? ?  ? ? ?I spent 30 minutes dedicated to the care of this patient on the date of this encounter to include pre-visit review of patient's medical history,  most recent  office visits with me and patient's specialists,  most recent ER visit/hospitalization, EKG, imaging studies, Face-to-face time with the patient , and post visit ordering of testing and therapeutics.   ? ?Follow-up: No follow-ups on file. ? ? ?Sherlene Shams, MD ?

## 2021-12-30 NOTE — Assessment & Plan Note (Signed)
Secondary to overworked state combined with mild anemia.  Continue b12 injections weekly  ?

## 2021-12-30 NOTE — Patient Instructions (Addendum)
I am prescribing B12 injections to help your fatigue. You can use them weekly  ? ?Your husband can use your arms and thighs  ? ?Repeating iron and hemoglobin today  ? ? ?

## 2021-12-31 LAB — CBC WITH DIFFERENTIAL/PLATELET
Basophils Absolute: 0.1 10*3/uL (ref 0.0–0.1)
Basophils Relative: 1.1 % (ref 0.0–3.0)
Eosinophils Absolute: 0.1 10*3/uL (ref 0.0–0.7)
Eosinophils Relative: 2.2 % (ref 0.0–5.0)
HCT: 37.6 % (ref 36.0–46.0)
Hemoglobin: 12.5 g/dL (ref 12.0–15.0)
Lymphocytes Relative: 43.5 % (ref 12.0–46.0)
Lymphs Abs: 2.3 10*3/uL (ref 0.7–4.0)
MCHC: 33.3 g/dL (ref 30.0–36.0)
MCV: 87 fl (ref 78.0–100.0)
Monocytes Absolute: 0.4 10*3/uL (ref 0.1–1.0)
Monocytes Relative: 7.2 % (ref 3.0–12.0)
Neutro Abs: 2.4 10*3/uL (ref 1.4–7.7)
Neutrophils Relative %: 46 % (ref 43.0–77.0)
Platelets: 319 10*3/uL (ref 150.0–400.0)
RBC: 4.32 Mil/uL (ref 3.87–5.11)
RDW: 15.2 % (ref 11.5–15.5)
WBC: 5.3 10*3/uL (ref 4.0–10.5)

## 2021-12-31 LAB — IBC + FERRITIN
Ferritin: 39.4 ng/mL (ref 10.0–291.0)
Iron: 127 ug/dL (ref 42–145)
Saturation Ratios: 43.6 % (ref 20.0–50.0)
TIBC: 291.2 ug/dL (ref 250.0–450.0)
Transferrin: 208 mg/dL — ABNORMAL LOW (ref 212.0–360.0)

## 2022-01-20 ENCOUNTER — Encounter: Payer: Self-pay | Admitting: Certified Nurse Midwife

## 2022-01-20 ENCOUNTER — Ambulatory Visit (INDEPENDENT_AMBULATORY_CARE_PROVIDER_SITE_OTHER): Admitting: Certified Nurse Midwife

## 2022-01-20 VITALS — BP 135/66 | HR 68 | Ht 63.0 in | Wt 141.6 lb

## 2022-01-20 DIAGNOSIS — Z01419 Encounter for gynecological examination (general) (routine) without abnormal findings: Secondary | ICD-10-CM

## 2022-01-20 DIAGNOSIS — L659 Nonscarring hair loss, unspecified: Secondary | ICD-10-CM | POA: Diagnosis not present
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
## 2022-01-21 LAB — THYROID PANEL WITH TSH
Free Thyroxine Index: 1.7 (ref 1.2–4.9)
T3 Uptake Ratio: 25 % (ref 24–39)
T4, Total: 6.6 ug/dL (ref 4.5–12.0)
TSH: 2.13 u[IU]/mL (ref 0.450–4.500)

## 2022-04-04 ENCOUNTER — Telehealth: Payer: Self-pay | Admitting: Internal Medicine

## 2022-04-04 NOTE — Telephone Encounter (Signed)
Patient has an appointment with Arnett on 7/19 @ 11:30. She has requested Dr Darrick Huntsman to renew her handicap plaque. Could she pick up completed form that day when she sees Arnett.

## 2022-04-04 NOTE — Telephone Encounter (Signed)
Placed in quick sign folder.  

## 2022-04-07 NOTE — Telephone Encounter (Signed)
Pt aware form is complete and ready for pick up. Form placed up front for pick up.

## 2022-04-09 ENCOUNTER — Ambulatory Visit: Admitting: Family

## 2022-05-23 ENCOUNTER — Encounter: Payer: Self-pay | Admitting: Certified Nurse Midwife

## 2022-05-28 ENCOUNTER — Other Ambulatory Visit: Payer: Self-pay | Admitting: Certified Nurse Midwife

## 2022-05-28 ENCOUNTER — Telehealth: Payer: Self-pay

## 2022-05-28 DIAGNOSIS — R102 Pelvic and perineal pain: Secondary | ICD-10-CM

## 2022-05-28 DIAGNOSIS — R1900 Intra-abdominal and pelvic swelling, mass and lump, unspecified site: Secondary | ICD-10-CM

## 2022-05-28 NOTE — Telephone Encounter (Signed)
Pt wants to know if she needs to scheduled an appt for the [place] if so let me know ill call and get her to help me get her on the schedule for a date that works. Thank you

## 2022-05-29 NOTE — Telephone Encounter (Signed)
I contacted patient via phone. Patient is scheduled for 06/02/22 at 4 pm

## 2022-05-30 ENCOUNTER — Telehealth: Payer: Self-pay | Admitting: Internal Medicine

## 2022-05-30 ENCOUNTER — Other Ambulatory Visit: Payer: Self-pay | Admitting: Internal Medicine

## 2022-05-30 MED ORDER — PREDNISONE 10 MG PO TABS: ORAL_TABLET | ORAL | 0 refills | Status: DC

## 2022-05-30 NOTE — Telephone Encounter (Signed)
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 Pt did have multiple blood patches. Caller states she feels very dizzy as if she's going to pass out if she stands up. Pain7/10   Spoke with Patient she states her best friend she was around all last week has the exact same symptoms and was tested for covid, flu A & B, strep & RSV all was Negative and the provider said it is a viral infection so the Patient said she is sorry she believes she has the same thing.

## 2022-05-30 NOTE — Telephone Encounter (Signed)
Called patient and informed her  her headaches were likely viral and that Dr Darrick Huntsman was sending in a Prednisone taper  to help. Patient verbalized understanding.

## 2022-05-30 NOTE — Telephone Encounter (Signed)
Pt called in stating she has a bad headache and the pain feels like the spinal injury pain she had at the beginning of this year. Sent to access nurse

## 2022-06-02 ENCOUNTER — Ambulatory Visit (INDEPENDENT_AMBULATORY_CARE_PROVIDER_SITE_OTHER)

## 2022-06-02 DIAGNOSIS — R1900 Intra-abdominal and pelvic swelling, mass and lump, unspecified site: Secondary | ICD-10-CM

## 2022-06-02 DIAGNOSIS — R102 Pelvic and perineal pain: Secondary | ICD-10-CM | POA: Diagnosis not present

## 2022-06-10 ENCOUNTER — Encounter: Payer: Self-pay | Admitting: Certified Nurse Midwife

## 2022-06-12 ENCOUNTER — Telehealth: Payer: Self-pay

## 2022-06-12 DIAGNOSIS — Z111 Encounter for screening for respiratory tuberculosis: Secondary | ICD-10-CM

## 2022-06-12 NOTE — Telephone Encounter (Signed)
Patient states she needs to have a TB skin test.  I let patient know that we will need orders from Dr. Deborra Medina for the test, so I will send her a note.

## 2022-06-13 NOTE — Telephone Encounter (Signed)
LMTCB. Please schedule pt for a nurse visit either on a Monday Tuesday or Wednesday to have a PPD placed.

## 2022-06-13 NOTE — Telephone Encounter (Signed)
She it okay to order a TB skin test for pt?

## 2022-06-13 NOTE — Telephone Encounter (Signed)
Tb skin test ordered   Must be done on a mon ,  tues , or wed

## 2022-06-13 NOTE — Telephone Encounter (Signed)
Patient returned our call.  I read Adair Laundry, CMA's message to patient.  Patient has been scheduled for a TB test on 06/16/2022.

## 2022-06-13 NOTE — Telephone Encounter (Signed)
noted 

## 2022-06-16 ENCOUNTER — Ambulatory Visit (INDEPENDENT_AMBULATORY_CARE_PROVIDER_SITE_OTHER)

## 2022-06-16 DIAGNOSIS — Z111 Encounter for screening for respiratory tuberculosis: Secondary | ICD-10-CM | POA: Diagnosis not present

## 2022-06-16 NOTE — Progress Notes (Signed)
Pt arrived for PPD placement given in L forearm. Pt tolerated injection well, showed no signs of distress nor voiced any concerns.   Instructed pt to head to checkout to sch PPD reading in 2-3 days & arrive around 4:12 for reading. Pt verbalized understanding to info.

## 2022-06-18 ENCOUNTER — Ambulatory Visit

## 2022-06-19 ENCOUNTER — Ambulatory Visit

## 2022-06-19 LAB — TB SKIN TEST
Induration: 0 mm
TB Skin Test: NEGATIVE

## 2022-06-20 NOTE — Progress Notes (Signed)
Pt came into office to have TB  read and it was negative Patient was given record of her Negative results

## 2022-07-01 ENCOUNTER — Encounter: Payer: Self-pay | Admitting: Certified Nurse Midwife

## 2022-07-22 ENCOUNTER — Encounter: Payer: Self-pay | Admitting: Internal Medicine

## 2022-07-24 ENCOUNTER — Encounter: Payer: Self-pay | Admitting: Certified Nurse Midwife

## 2022-07-24 ENCOUNTER — Other Ambulatory Visit: Payer: Self-pay | Admitting: Certified Nurse Midwife

## 2022-07-24 MED ORDER — TRANEXAMIC ACID 650 MG PO TABS: 1300.0000 mg | ORAL_TABLET | Freq: Three times a day (TID) | ORAL | 0 refills | Status: AC

## 2022-07-30 ENCOUNTER — Emergency Department
Admission: EM | Admit: 2022-07-30 | Discharge: 2022-07-30 | Disposition: A | Discharge status: Home / Self Care | Attending: Emergency Medicine | Admitting: Emergency Medicine

## 2022-07-30 ENCOUNTER — Telehealth: Payer: Self-pay | Admitting: Internal Medicine

## 2022-07-30 ENCOUNTER — Encounter: Payer: Self-pay | Admitting: Emergency Medicine

## 2022-07-30 ENCOUNTER — Emergency Department

## 2022-07-30 ENCOUNTER — Other Ambulatory Visit: Payer: Self-pay

## 2022-07-30 DIAGNOSIS — G500 Trigeminal neuralgia: Secondary | ICD-10-CM | POA: Insufficient documentation

## 2022-07-30 DIAGNOSIS — R202 Paresthesia of skin: Secondary | ICD-10-CM | POA: Diagnosis present

## 2022-07-30 LAB — DIFFERENTIAL
Abs Immature Granulocytes: 0.01 10*3/uL (ref 0.00–0.07)
Basophils Absolute: 0 10*3/uL (ref 0.0–0.1)
Basophils Relative: 1 %
Eosinophils Absolute: 0.1 10*3/uL (ref 0.0–0.5)
Eosinophils Relative: 1 %
Immature Granulocytes: 0 %
Lymphocytes Relative: 43 %
Lymphs Abs: 2.3 10*3/uL (ref 0.7–4.0)
Monocytes Absolute: 0.4 10*3/uL (ref 0.1–1.0)
Monocytes Relative: 7 %
Neutro Abs: 2.5 10*3/uL (ref 1.7–7.7)
Neutrophils Relative %: 48 %

## 2022-07-30 LAB — COMPREHENSIVE METABOLIC PANEL
ALT: 12 U/L (ref 0–44)
AST: 16 U/L (ref 15–41)
Albumin: 4.4 g/dL (ref 3.5–5.0)
Alkaline Phosphatase: 64 U/L (ref 38–126)
Anion gap: 7 (ref 5–15)
BUN: 11 mg/dL (ref 6–20)
CO2: 25 mmol/L (ref 22–32)
Calcium: 9 mg/dL (ref 8.9–10.3)
Chloride: 107 mmol/L (ref 98–111)
Creatinine, Ser: 0.81 mg/dL (ref 0.44–1.00)
GFR, Estimated: >60 mL/min (ref >60)
Glucose, Bld: 162 mg/dL — ABNORMAL HIGH (ref 70–99)
Potassium: 3.3 mmol/L — ABNORMAL LOW (ref 3.5–5.1)
Sodium: 139 mmol/L (ref 135–145)
Total Bilirubin: 0.8 mg/dL (ref 0.3–1.2)
Total Protein: 7.5 g/dL (ref 6.5–8.1)

## 2022-07-30 LAB — PROTIME-INR
INR: 1.1 (ref 0.8–1.2)
Prothrombin Time: 14 seconds (ref 11.4–15.2)

## 2022-07-30 LAB — CBC
HCT: 37.1 % (ref 36.0–46.0)
Hemoglobin: 12.4 g/dL (ref 12.0–15.0)
MCH: 29 pg (ref 26.0–34.0)
MCHC: 33.4 g/dL (ref 30.0–36.0)
MCV: 86.9 fL (ref 80.0–100.0)
Platelets: 353 10*3/uL (ref 150–400)
RBC: 4.27 MIL/uL (ref 3.87–5.11)
RDW: 12.4 % (ref 11.5–15.5)
WBC: 5.2 10*3/uL (ref 4.0–10.5)
nRBC: 0 % (ref 0.0–0.2)

## 2022-07-30 LAB — APTT: aPTT: 27 seconds (ref 24–36)

## 2022-07-30 LAB — ETHANOL: Alcohol, Ethyl (B): <10 mg/dL (ref <10)

## 2022-07-30 LAB — TROPONIN I (HIGH SENSITIVITY): Troponin I (High Sensitivity): 3 ng/L (ref <18)

## 2022-07-30 LAB — SEDIMENTATION RATE: Sed Rate: 8 mm/hr (ref 0–20)

## 2022-07-30 MED ORDER — KETOROLAC TROMETHAMINE 30 MG/ML IJ SOLN
30.0000 mg | Freq: Once | INTRAMUSCULAR | Status: AC
  Administered 2022-07-30: 30 mg via INTRAMUSCULAR
  Filled 2022-07-30: qty 1

## 2022-07-30 MED ORDER — SODIUM CHLORIDE 0.9% FLUSH: 3.0000 mL | Freq: Once | INTRAVENOUS | Status: DC

## 2022-07-30 MED ORDER — MELOXICAM 15 MG PO TABS: 15.0000 mg | ORAL_TABLET | Freq: Every day | ORAL | 0 refills | Status: DC

## 2022-07-30 NOTE — ED Notes (Signed)
No reaction noted to injection site.  

## 2022-07-30 NOTE — Telephone Encounter (Signed)
Pt is currently at the ED

## 2022-07-30 NOTE — Telephone Encounter (Signed)
Pt called stating her face is numb and tingling. Pt stated it just happen today. sent to access nurse

## 2022-07-30 NOTE — ED Provider Notes (Signed)
[facility] Provider Note  Patient Contact: 3:18 PM (approximate)   History   Tingling   HPI  Christina Ruiz is a 31 y.o. female who presents the emergency department complaining of facial numbness and tingling.  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  Patient states that she brushed her eyebrow but cannot get the sensation to go away.  After the session was over she started to have some numbness and tingling surrounded the left eye and eventually progressed over the midline to surround the right eye.  There is no tingling into the jaw.  Patient has no headache, visual changes, unilateral weakness, slurred speech.  She states that she does have a history of anxiety and after feeling the symptoms she started to have a panic attack.  Patient states that the majority of her symptoms have improved though she still has some tingling around both eyes.     Physical Exam   Triage Vital Signs: ED Triage Vitals [07/30/22 1455]  Enc Vitals Group     BP (!) 122/91     Pulse Rate 79     Resp 18     Temp 98.8 F (37.1 C)     Temp Source Oral     SpO2 98 %     Weight      Height      Head Circumference      Peak Flow      Pain Score 3     Pain Loc      Pain Edu?      Excl. in [place]?     Most recent vital signs: Vitals:   07/30/22 1455  BP: (!) 122/91  Pulse: 79  Resp: 18  Temp: 98.8 F (37.1 C)  SpO2: 98%     General: Alert and in no acute distress. Head: No acute traumatic findings.  No erythema, edema.  No visible abnormality.  Nontender to palpation over the face and skull.  There is no palpable cords or lesions in the temporal regions. ENT:      Ears:       Nose: No congestion/rhinnorhea.      Mouth/Throat: Mucous membranes are moist. Neck: No stridor. No cervical spine tenderness to palpation.  Cardiovascular:  Good peripheral perfusion Respiratory:  Normal respiratory effort without tachypnea or retractions. Lungs CTAB. Good air entry to the bases with no decreased or absent breath sounds. Musculoskeletal: Full range of motion to all extremities.  Neurologic:  No gross focal neurologic deficits are appreciated.  Cranial nerves grossly intact at this time. Skin:   No rash noted Other: Patient appears very anxious about her health.   ED Results / Procedures / Treatments   Labs (all labs ordered are listed, but only abnormal results are displayed) Labs Reviewed  COMPREHENSIVE METABOLIC PANEL - Abnormal; Notable for the following components:      Result Value   Potassium 3.3 (*)    Glucose, Bld 162 (*)    All other components within normal limits  PROTIME-INR  APTT  CBC  DIFFERENTIAL  ETHANOL  SEDIMENTATION RATE  CBG MONITORING, ED  POC URINE PREG, ED  TROPONIN I (HIGH SENSITIVITY)  TROPONIN I (HIGH SENSITIVITY)     EKG  ED ECG REPORT I, Charline Bills Geneive Sandstrom,  personally viewed and interpreted this ECG.   Date: 07/30/2022  EKG Time: 1457 hrs  Rate:  78 bpm  Rhythm: unchanged from previous tracings, normal sinus rhythm  Axis: normal axis  Intervals:none  ST&T Change: No ST elevation or depression  Normal sinus rhythm with no STEMI. Largely unchanged since previous EKG from 03/16/2021  RADIOLOGY  I personally viewed, evaluated, and interpreted these images as part of my medical decision making, as well as reviewing the written report by the radiologist.  ED Provider Interpretation: No acute intracranial abnormality on CT scan of the head.  CT HEAD WO CONTRAST  Result Date: 07/30/2022 CLINICAL DATA:  Numbness, tingling, paresthesia. EXAM: CT HEAD WITHOUT CONTRAST TECHNIQUE: Contiguous axial images were obtained from the base of the skull through the vertex without intravenous contrast. RADIATION DOSE REDUCTION: This exam was performed according to the departmental dose-optimization program which includes automated  exposure control, adjustment of the mA and/or kV according to patient size and/or use of iterative reconstruction technique. COMPARISON:  MRI June of 2017. FINDINGS: Brain: The brain shows a normal appearance without evidence of malformation, atrophy, old or acute small or large vessel infarction, mass lesion, hemorrhage, hydrocephalus or extra-axial collection. Vascular: No hyperdense vessel. No evidence of atherosclerotic calcification. Skull: Normal.  No traumatic finding.  No focal bone lesion. Sinuses/Orbits: Sinuses are clear. Orbits appear normal. Mastoids are clear. Other: None significant IMPRESSION: Normal head CT. Electronically Signed   By: Nelson Chimes M.D.   On: 07/30/2022 15:13    PROCEDURES:  Critical Care performed: No  Procedures   MEDICATIONS ORDERED IN ED: Medications  ketorolac (TORADOL) 30 MG/ML injection 30 mg (has no administration in time range)      IMPRESSION / MDM / ASSESSMENT AND PLAN / ED COURSE  I reviewed the triage vital signs and the nursing notes.                              Differential diagnosis includes, but is not limited to, panic attack, anxiety, trigeminal neuralgia, temporal arteritis, CVA  Patient's presentation is most consistent with acute presentation with potential threat to life or bodily function.   Patient's diagnosis is consistent with trigeminal neuralgia.  Patient had numbness and tingling bilaterally over the forehead and around the eyes.  There was no recent trauma.  No headache, visual changes.  Patient states that symptoms started, then triggered a panic attack for her.  Patient's anxiety/panic attack has subsided but still has slight numbness and tingling.  No concerning findings on physical exam.  There was no evidence of skin changes concerning for shingles.  Has just also crosses the midline has a low suspicion for shingles.  Patient CT scan was negative.  Labs are reassuring.  ESR is not elevated.  At this time given the  presentation suspect component of anxiety versus trigeminal neuralgia.  Will treat with anti-inflammatories.  Concerning signs and symptoms to return to the emergency department are discussed with the patient.  Follow-up primary care as needed..  Patient is given ED precautions to return to the ED for any worsening or new symptoms.        FINAL CLINICAL IMPRESSION(S) / ED DIAGNOSES   Final diagnoses:  Trigeminal neuralgia     Rx / DC Orders   ED Discharge Orders          Ordered    meloxicam (MOBIC) 15 MG tablet  Daily        07/30/22 1659             Note:  This document was prepared using Dragon voice recognition software and may include unintentional dictation errors.   Brynda Peon 07/30/22 1659    Rada Hay, MD 07/30/22 2011

## 2022-07-30 NOTE — ED Triage Notes (Signed)
Patient to ED via [facility] from work for facial tinging across face and bilateral hand tingling. Patient states this started at 1340. Patient states she has previously had episodes of tinging. No weakness or numbness. Negative stroke scale per EMS. Patient also c/o chest pressure.

## 2022-08-06 ENCOUNTER — Encounter: Payer: Self-pay | Admitting: Internal Medicine

## 2022-10-07 ENCOUNTER — Ambulatory Visit: Admitting: Internal Medicine

## 2022-10-27 ENCOUNTER — Encounter: Payer: Self-pay | Admitting: Internal Medicine

## 2022-10-27 ENCOUNTER — Ambulatory Visit: Admitting: Internal Medicine

## 2022-10-27 VITALS — BP 110/82 | HR 79 | Temp 98.4°F | Ht 63.0 in | Wt 149.2 lb

## 2022-10-27 DIAGNOSIS — R140 Abdominal distension (gaseous): Secondary | ICD-10-CM

## 2022-10-27 DIAGNOSIS — L089 Local infection of the skin and subcutaneous tissue, unspecified: Secondary | ICD-10-CM

## 2022-10-27 DIAGNOSIS — L723 Sebaceous cyst: Secondary | ICD-10-CM | POA: Diagnosis not present

## 2022-10-27 DIAGNOSIS — K921 Melena: Secondary | ICD-10-CM

## 2022-10-27 DIAGNOSIS — D649 Anemia, unspecified: Secondary | ICD-10-CM | POA: Diagnosis not present

## 2022-10-27 DIAGNOSIS — K580 Irritable bowel syndrome with diarrhea: Secondary | ICD-10-CM

## 2022-10-27 LAB — COMPREHENSIVE METABOLIC PANEL
ALT: 8 U/L (ref 0–35)
AST: 12 U/L (ref 0–37)
Albumin: 4.6 g/dL (ref 3.5–5.2)
Alkaline Phosphatase: 57 U/L (ref 39–117)
BUN: 13 mg/dL (ref 6–23)
CO2: 26 mEq/L (ref 19–32)
Calcium: 9.2 mg/dL (ref 8.4–10.5)
Chloride: 104 mEq/L (ref 96–112)
Creatinine, Ser: 0.79 mg/dL (ref 0.40–1.20)
GFR: 101.11 mL/min (ref >60.00)
Glucose, Bld: 81 mg/dL (ref 70–99)
Potassium: 3.9 mEq/L (ref 3.5–5.1)
Sodium: 138 mEq/L (ref 135–145)
Total Bilirubin: 0.5 mg/dL (ref 0.2–1.2)
Total Protein: 7.1 g/dL (ref 6.0–8.3)

## 2022-10-27 LAB — CBC WITH DIFFERENTIAL/PLATELET
Basophils Absolute: 0 10*3/uL (ref 0.0–0.1)
Basophils Relative: 0.8 % (ref 0.0–3.0)
Eosinophils Absolute: 0.1 10*3/uL (ref 0.0–0.7)
Eosinophils Relative: 1.4 % (ref 0.0–5.0)
HCT: 39 % (ref 36.0–46.0)
Hemoglobin: 13.1 g/dL (ref 12.0–15.0)
Lymphocytes Relative: 48.2 % — ABNORMAL HIGH (ref 12.0–46.0)
Lymphs Abs: 2.6 10*3/uL (ref 0.7–4.0)
MCHC: 33.6 g/dL (ref 30.0–36.0)
MCV: 87.8 fl (ref 78.0–100.0)
Monocytes Absolute: 0.4 10*3/uL (ref 0.1–1.0)
Monocytes Relative: 8 % (ref 3.0–12.0)
Neutro Abs: 2.3 10*3/uL (ref 1.4–7.7)
Neutrophils Relative %: 41.6 % — ABNORMAL LOW (ref 43.0–77.0)
Platelets: 364 10*3/uL (ref 150.0–400.0)
RBC: 4.45 Mil/uL (ref 3.87–5.11)
RDW: 13.7 % (ref 11.5–15.5)
WBC: 5.5 10*3/uL (ref 4.0–10.5)

## 2022-10-27 LAB — TSH: TSH: 4.94 u[IU]/mL (ref 0.35–5.50)

## 2022-10-27 IMAGING — US US OB COMP LESS 14 WK
1 series · 14 of 28 positions shown · non-contrast
Comparison: None

CLINICAL DATA: Amenorrhea, first trimester pregnancy, dating,
quantitative beta HCG 20,196 3 weeks ago, uncertain LMP

EXAM:
OBSTETRIC <14 WK ULTRASOUND
TECHNIQUE: Transabdominal ultrasound was performed for evaluation of the
gestation as well as the maternal uterus and adnexal regions.

[Series 1: us ob less than 14 weeks with ob transvaginal · 14 of 58 slices shown]
[im 3/58]
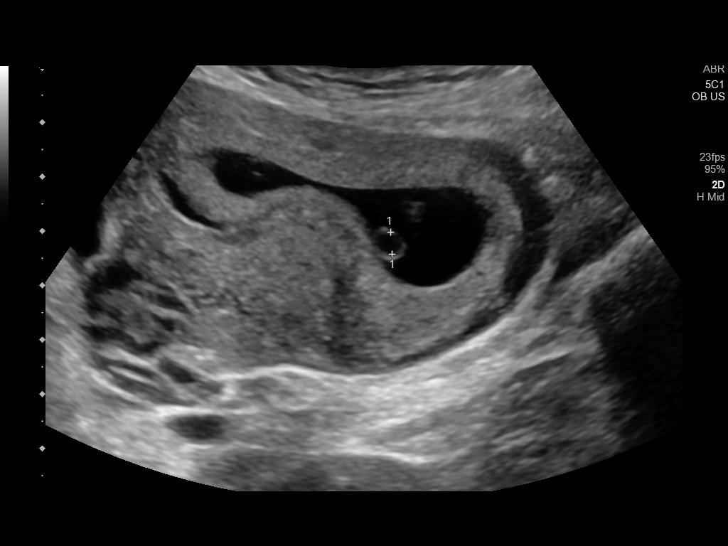
[im 7/58]
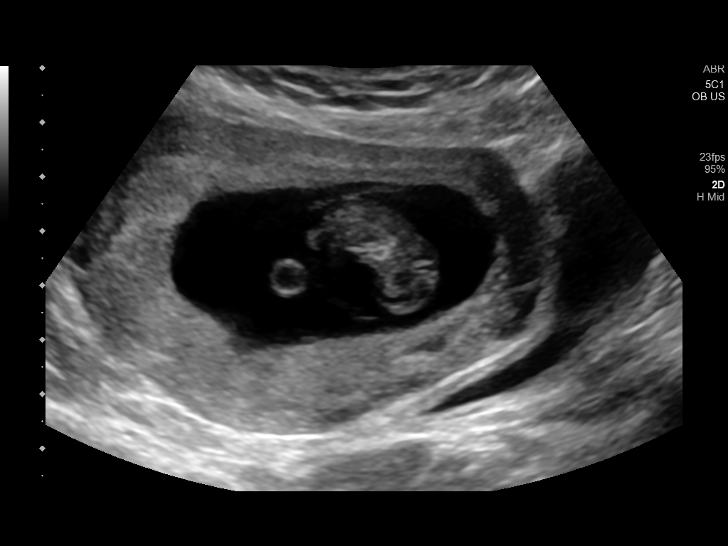
[im 11/58]
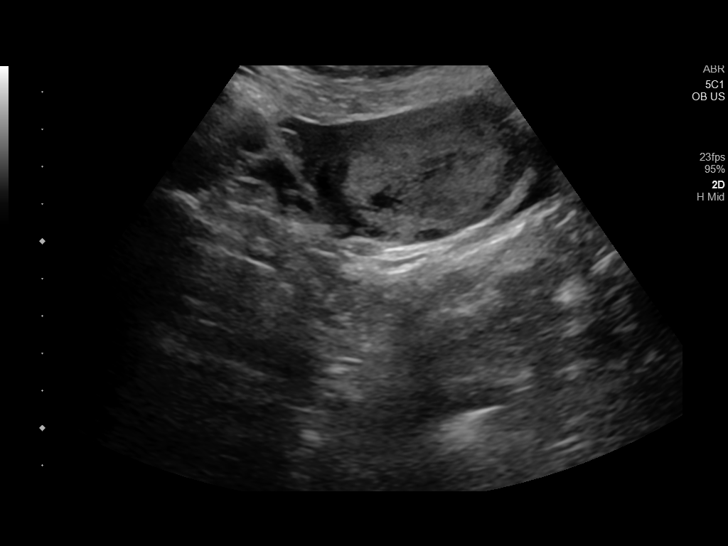
[im 15/58]
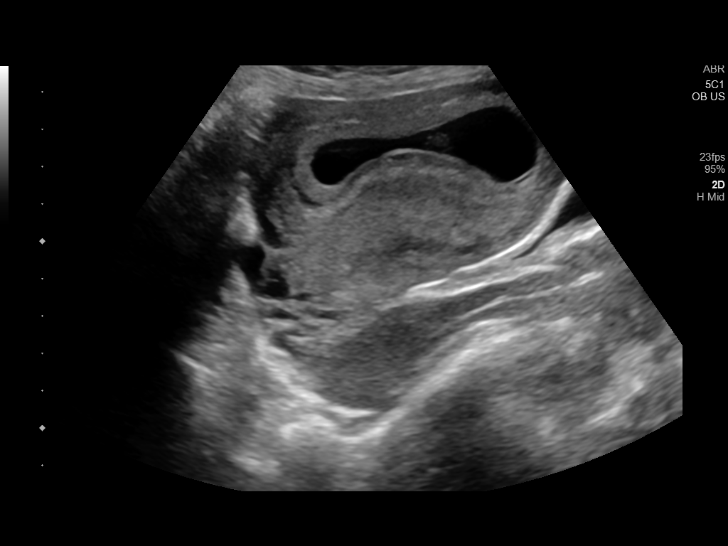
[im 20/58]
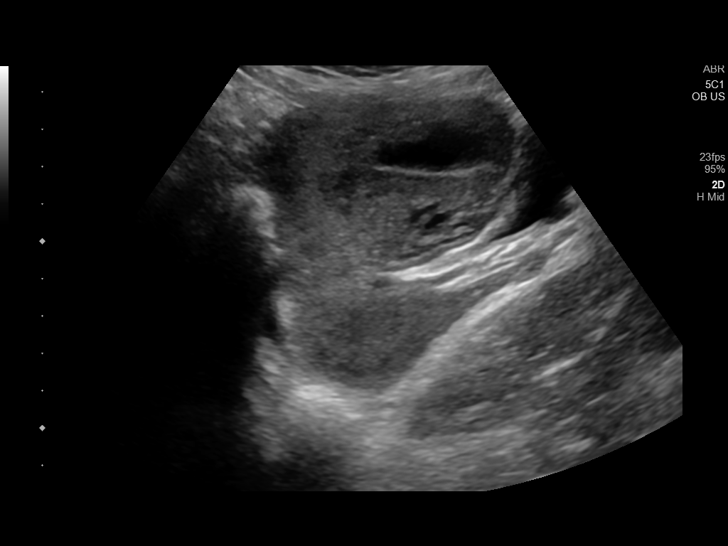
[im 24/58]
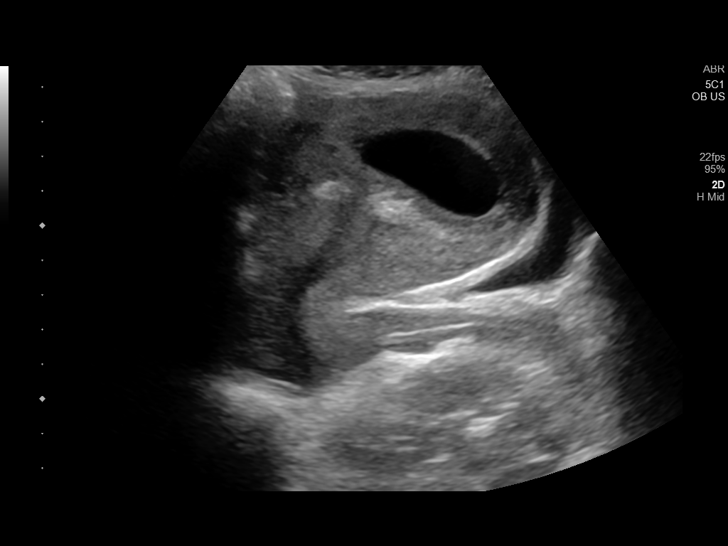
[im 28/58]
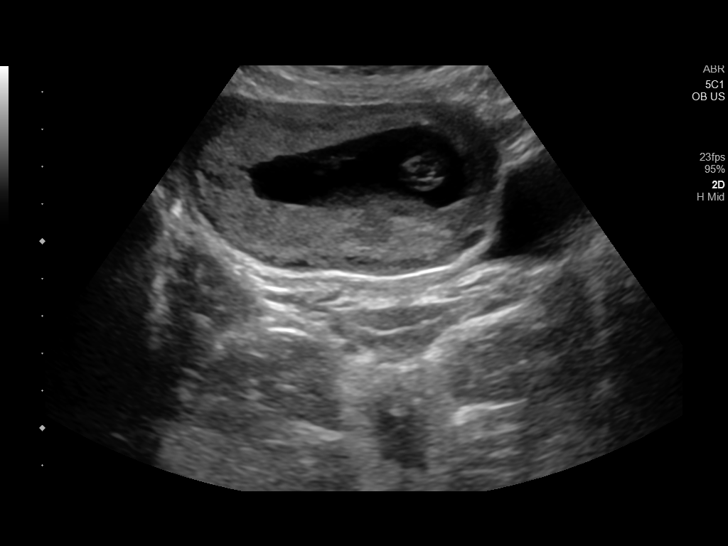
[im 32/58]
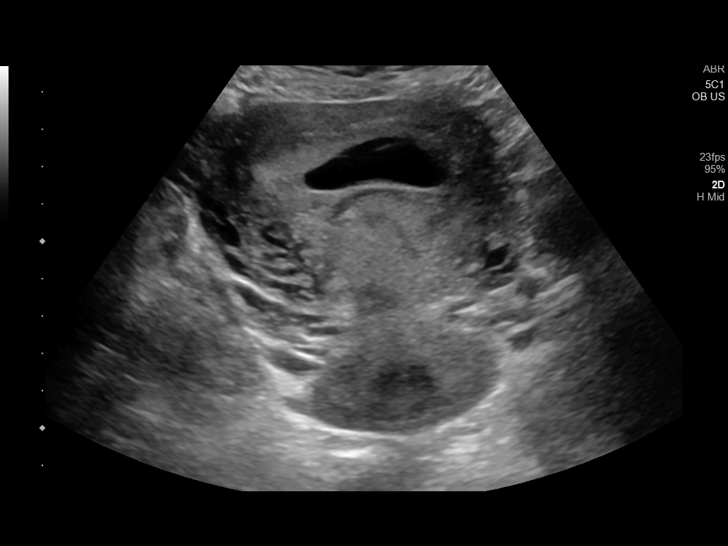
[im 36/58]
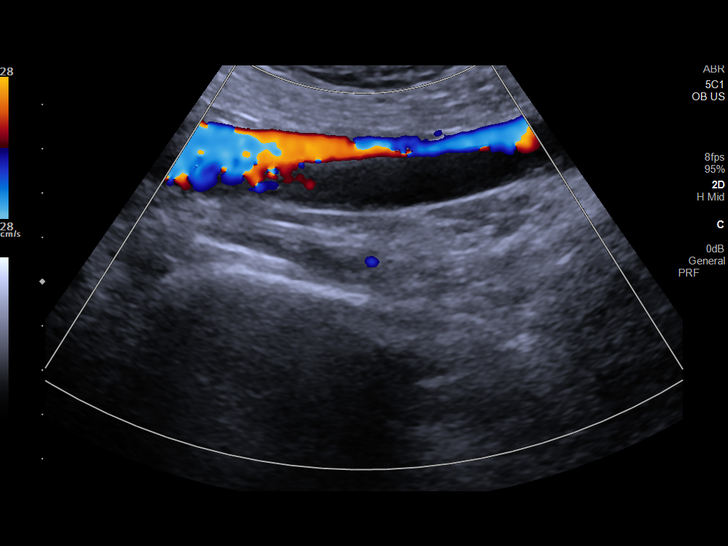
[im 41/58]
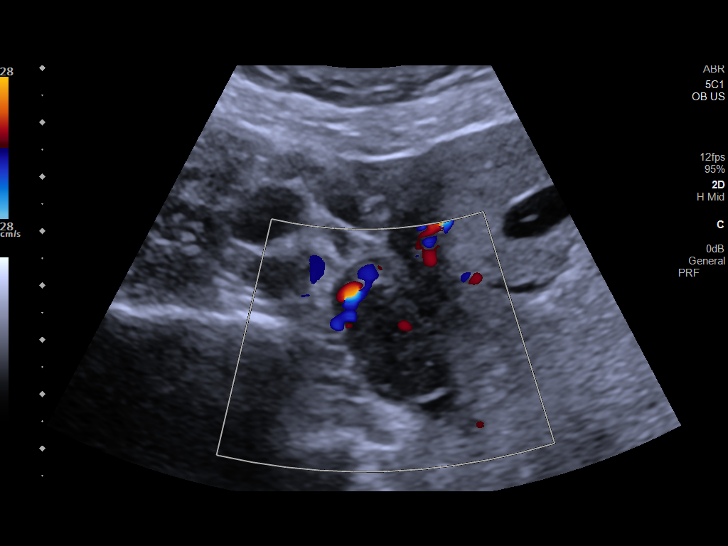
[im 45/58]
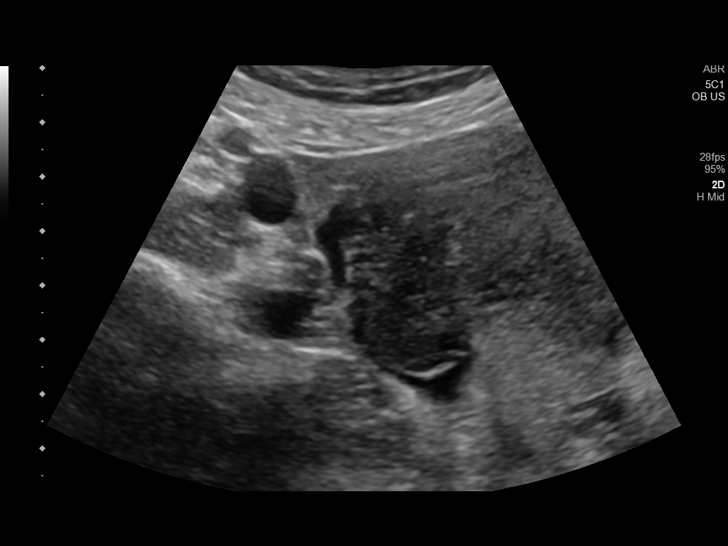
[im 49/58]
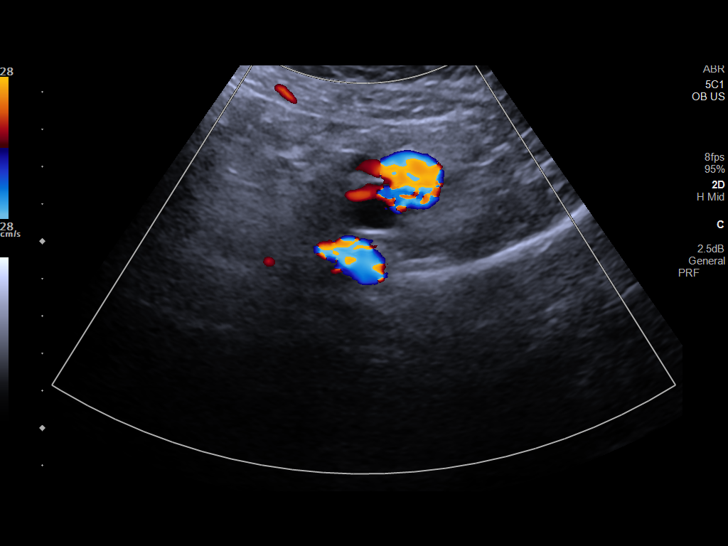
[im 53/58]
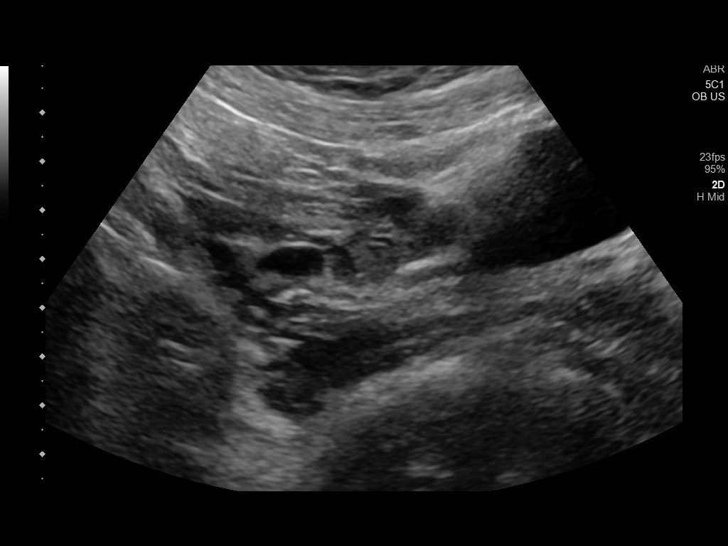
[im 58/58]
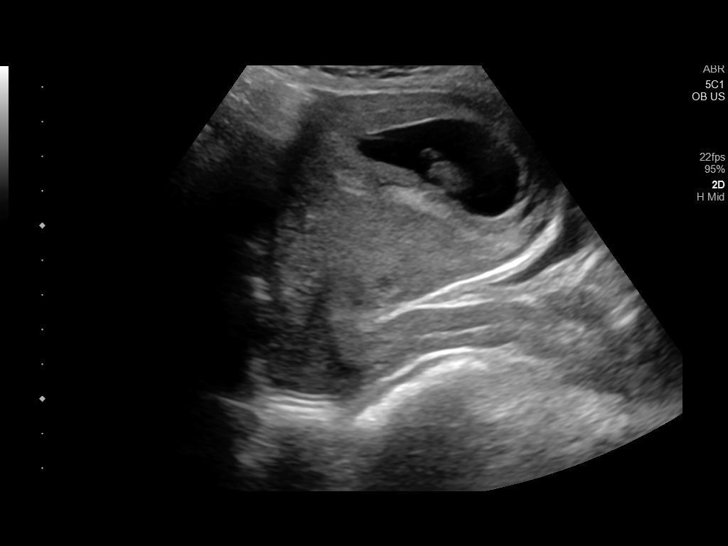

[14 of 28 positions shown; findings below may reference images not displayed]

FINDINGS: Intrauterine gestational sac: Present, single

Yolk sac:  Present

Embryo:  Present

Cardiac Activity: Present

Heart Rate: 158 bpm

CRL:   25.5 mm   9 w 2 d                  US EDC: 09/24/2021

Subchorionic hemorrhage:  None visualized.

Maternal uterus/adnexae:

RIGHT ovary normal size and morphology 2.7 x 1.5 x 2.1 cm.

LEFT ovary normal size and morphology 2.5 x 1.8 x 3.0 cm.

No free pelvic fluid or adnexal masses.
IMPRESSION: Single live intrauterine gestation at 9 weeks 2 days EGA by
crown-rump length.

No acute abnormalities.

## 2022-10-27 MED ORDER — SULFAMETHOXAZOLE-TRIMETHOPRIM 800-160 MG PO TABS: 1.0000 | ORAL_TABLET | Freq: Two times a day (BID) | ORAL | 0 refills | Status: AC

## 2022-10-27 NOTE — Patient Instructions (Signed)
I am prescribing Septra DS twice daily for the skin infection .  Daily use of a probiotic advised for 3 weeks.    Referral to GI for colonoscopy. Le me know ASAP who you would like to be referred to (if you have a preference).  CT abd and pelvis for the bloating

## 2022-10-27 NOTE — Progress Notes (Unsigned)
Subjective:  Patient ID: Christina Ruiz, female    DOB: 1991-07-05  Age: 32 y.o. MRN: [id number]  CC: There were no encounter diagnoses.   HPI Wandra Babin presents for  Chief Complaint  Patient presents with   Acute Visit    Blood in stool and spot on stomach   1) BRBPR  .  Has been noting  streaks of  maroon colored blood in her stools in a pattern that has been  intermittent for the past year.  Last episode occurred 2 days ago, and the amount was increased and for the first time  was bright red.  required prolong  wipin g .  She denies bleeding in between stools.   denies hard stools and loose stools. , History of a colonic polyp as a very preschooler  child,  found during workup for blood is stool.   Also reports episodes of abdominal bloating lasting up to two weeks  that has been occurring without constipation and separate from the bloating associated with menstraul periods   2) history of IDA intolerant of oral supplements. Has not taken any in over a year  3) painful papule on abdominal wall present for the past week  4) positive depression screen   Outpatient Medications Prior to Visit  Medication Sig Dispense Refill   acetaminophen (TYLENOL) 325 MG tablet Take 650 mg by mouth every 6 (six) hours as needed.     meloxicam (MOBIC) 15 MG tablet Take 1 tablet (15 mg total) by mouth daily. (Patient not taking: Reported on 10/27/2022) 30 tablet 0   predniSONE (DELTASONE) 10 MG tablet 6 tablets on Day 1 , then reduce by 1 tablet daily until gone (Patient not taking: Reported on 10/27/2022) 21 tablet 0   No facility-administered medications prior to visit.    Review of Systems;  Patient denies headache, fevers, malaise, unintentional weight loss, skin rash, eye pain, sinus congestion and sinus pain, sore throat, dysphagia,  hemoptysis , cough, dyspnea, wheezing, chest pain, palpitations, orthopnea, edema, abdominal pain, nausea, melena, diarrhea, constipation, flank pain, dysuria,  hematuria, urinary  Frequency, nocturia, numbness, tingling, seizures,  Focal weakness, Loss of consciousness,  Tremor, insomnia, depression, anxiety, and suicidal ideation.      Objective:  BP 110/82   Pulse 79   Temp 98.4 F (36.9 C) (Oral)   Ht 5\' 3"  (1.6 m)   Wt 149 lb 3.2 oz (67.7 kg)   SpO2 97%   BMI 26.43 kg/m   BP Readings from Last 3 Encounters:  10/27/22 110/82  07/30/22 (!) 122/91  01/20/22 135/66    Wt Readings from Last 3 Encounters:  10/27/22 149 lb 3.2 oz (67.7 kg)  01/20/22 141 lb 9.6 oz (64.2 kg)  12/30/21 141 lb 3.2 oz (64 kg)    Physical Exam  No results found for: "HGBA1C"  Lab Results  Component Value Date   CREATININE 0.81 07/30/2022   CREATININE 0.73 11/25/2021   CREATININE 0.67 09/16/2021    Lab Results  Component Value Date   WBC 5.2 07/30/2022   HGB 12.4 07/30/2022   HCT 37.1 07/30/2022   PLT 353 07/30/2022   GLUCOSE 162 (H) 07/30/2022   CHOL 163 01/16/2021   TRIG 51 01/16/2021   HDL 64 01/16/2021   LDLCALC 88 01/16/2021   ALT 12 07/30/2022   AST 16 07/30/2022   NA 139 07/30/2022   K 3.3 (L) 07/30/2022   CL 107 07/30/2022   CREATININE 0.81 07/30/2022   BUN 11 07/30/2022  CO2 25 07/30/2022   TSH 2.130 01/20/2022   INR 1.1 07/30/2022    CT HEAD WO CONTRAST  Result Date: 07/30/2022 CLINICAL DATA:  Numbness, tingling, paresthesia. EXAM: CT HEAD WITHOUT CONTRAST TECHNIQUE: Contiguous axial images were obtained from the base of the skull through the vertex without intravenous contrast. RADIATION DOSE REDUCTION: This exam was performed according to the departmental dose-optimization program which includes automated exposure control, adjustment of the mA and/or kV according to patient size and/or use of iterative reconstruction technique. COMPARISON:  MRI June of 2017. FINDINGS: Brain: The brain shows a normal appearance without evidence of malformation, atrophy, old or acute small or large vessel infarction, mass lesion, hemorrhage,  hydrocephalus or extra-axial collection. Vascular: No hyperdense vessel. No evidence of atherosclerotic calcification. Skull: Normal.  No traumatic finding.  No focal bone lesion. Sinuses/Orbits: Sinuses are clear. Orbits appear normal. Mastoids are clear. Other: None significant IMPRESSION: Normal head CT. Electronically Signed   By: Nelson Chimes M.D.   On: 07/30/2022 15:13    Assessment & Plan:  .There are no diagnoses linked to this encounter.   I provided 30 minutes of face-to-face time during this encounter reviewing patient's last visit with me, patient's  most recent visit with cardiology,  nephrology,  and neurology,  recent surgical and non surgical procedures, previous  labs and imaging studies, counseling on currently addressed issues,  and post visit ordering to diagnostics and therapeutics .   Follow-up: No follow-ups on file.   Crecencio Mc, MD

## 2022-10-28 ENCOUNTER — Encounter: Payer: Self-pay | Admitting: Internal Medicine

## 2022-10-28 ENCOUNTER — Ambulatory Visit
Admission: RE | Admit: 2022-10-28 | Discharge: 2022-10-28 | Disposition: A | Discharge status: Home / Self Care | Source: Ambulatory Visit | Attending: Internal Medicine | Admitting: Internal Medicine

## 2022-10-28 DIAGNOSIS — K921 Melena: Secondary | ICD-10-CM

## 2022-10-28 DIAGNOSIS — L089 Local infection of the skin and subcutaneous tissue, unspecified: Secondary | ICD-10-CM | POA: Insufficient documentation

## 2022-10-28 DIAGNOSIS — R140 Abdominal distension (gaseous): Secondary | ICD-10-CM | POA: Diagnosis present

## 2022-10-28 DIAGNOSIS — D649 Anemia, unspecified: Secondary | ICD-10-CM

## 2022-10-28 LAB — POC HEMOCCULT BLD/STL (OFFICE/1-CARD/DIAGNOSTIC)

## 2022-10-28 LAB — IRON,TIBC AND FERRITIN PANEL
%SAT: 24 % (calc) (ref 16–45)
Ferritin: 15 ng/mL — ABNORMAL LOW (ref 16–154)
Iron: 73 ug/dL (ref 40–190)
TIBC: 299 mcg/dL (calc) (ref 250–450)

## 2022-10-28 MED ORDER — IOHEXOL 300 MG/ML  SOLN
85.0000 mL | Freq: Once | INTRAMUSCULAR | Status: AC | PRN
  Administered 2022-10-28: 85 mL via INTRAVENOUS

## 2022-10-28 NOTE — Assessment & Plan Note (Signed)
She has developed increased bloating unrelated to movement of bowels.  Given her concurrent hematochezia.  Will order CT abd and pelvis

## 2022-10-28 NOTE — Assessment & Plan Note (Signed)
Septra prescribed 

## 2022-10-29 ENCOUNTER — Other Ambulatory Visit: Payer: Self-pay | Admitting: Internal Medicine

## 2022-10-29 MED ORDER — PANTOPRAZOLE SODIUM 40 MG PO TBEC: 40.0000 mg | DELAYED_RELEASE_TABLET | Freq: Every day | ORAL | 3 refills | Status: DC

## 2022-11-12 ENCOUNTER — Encounter: Payer: Self-pay | Admitting: Internal Medicine

## 2022-11-12 ENCOUNTER — Ambulatory Visit: Admitting: Internal Medicine

## 2022-11-12 VITALS — BP 110/68 | HR 88 | Temp 98.0°F | Ht 63.0 in | Wt 145.2 lb

## 2022-11-12 DIAGNOSIS — F330 Major depressive disorder, recurrent, mild: Secondary | ICD-10-CM | POA: Diagnosis not present

## 2022-11-12 DIAGNOSIS — K580 Irritable bowel syndrome with diarrhea: Secondary | ICD-10-CM

## 2022-11-12 DIAGNOSIS — L723 Sebaceous cyst: Secondary | ICD-10-CM | POA: Diagnosis not present

## 2022-11-12 DIAGNOSIS — L089 Local infection of the skin and subcutaneous tissue, unspecified: Secondary | ICD-10-CM

## 2022-11-12 DIAGNOSIS — K2950 Unspecified chronic gastritis without bleeding: Secondary | ICD-10-CM

## 2022-11-12 DIAGNOSIS — R41840 Attention and concentration deficit: Secondary | ICD-10-CM

## 2022-11-12 NOTE — Patient Instructions (Addendum)
The pantoprazole for treating gastritis was sent to your pharmacy.  Take it once daily in the morning on an empty stomach.  The blood test today is for H Pylori which if present, causes recurrent gastritis   I will refer you for ADHD testing to [facility]  Referral to dermatology for the skin lesions   Let me know if you want to restart prozac  If the GI symptoms do not improve with Protonix ,  you might have food intolerances and/or IBS:  The book I referred to today when we were discussing your GI issues is Dr Claris Pong Bennett's book "AIP Diet for Beginners: A Comprehensive Guide to the Autoimmune Paleo protocol"

## 2022-11-12 NOTE — Progress Notes (Unsigned)
Subjective:  Patient ID: Christina Ruiz, female    DOB: Mar 25, 1991  Age: 32 y.o. MRN: [id number]  CC: There were no encounter diagnoses.   HPI Christina Ruiz presents for  Chief Complaint  Patient presents with   Depression    2 week follow up on depression   2 week follow up on BLOATING,  hematochezia. CT abdoemn and pelvis  was done: moderate stool burden c/w  constipation,,  gastritis suggested.  PPI prescribed.  GI referral made at visit with Christina Ruiz  in June . Not anemic ,  no recent episodes.    Gastritis:  suggested by CT.  History of gastritis during [employer] service in the [employer] "told she had an ulcer "but no EGD done (2019-'2020 ) not sure what she was treated with   used to see a psychiatrist but missed appt in September. Wants to resume prozac. Wants testing for ADHD has had trouble her entire life,  college professor told her she had it ,  no prior testin    Outpatient Medications Prior to Visit  Medication Sig Dispense Refill   acetaminophen (TYLENOL) 325 MG tablet Take 650 mg by mouth every 6 (six) hours as needed.     pantoprazole (PROTONIX) 40 MG tablet Take 1 tablet (40 mg total) by mouth daily. 30 tablet 3   No facility-administered medications prior to visit.    Review of Systems;  Patient denies headache, fevers, malaise, unintentional weight loss, skin rash, eye pain, sinus congestion and sinus pain, sore throat, dysphagia,  hemoptysis , cough, dyspnea, wheezing, chest pain, palpitations, orthopnea, edema, abdominal pain, nausea, melena, diarrhea, constipation, flank pain, dysuria, hematuria, urinary  Frequency, nocturia, numbness, tingling, seizures,  Focal weakness, Loss of consciousness,  Tremor, insomnia, depression, anxiety, and suicidal ideation.      Objective:  LMP 10/24/2022 (Exact Date)   BP Readings from Last 3 Encounters:  10/27/22 110/82  07/30/22 (!) 122/91  01/20/22 135/66    Wt Readings from Last 3 Encounters:  10/27/22 149 lb 3.2 oz  (67.7 kg)  01/20/22 141 lb 9.6 oz (64.2 kg)  12/30/21 141 lb 3.2 oz (64 kg)    Physical Exam  No results found for: "HGBA1C"  Lab Results  Component Value Date   CREATININE 0.79 10/27/2022   CREATININE 0.81 07/30/2022   CREATININE 0.73 11/25/2021    Lab Results  Component Value Date   WBC 5.5 10/27/2022   HGB 13.1 10/27/2022   HCT 39.0 10/27/2022   PLT 364.0 10/27/2022   GLUCOSE 81 10/27/2022   CHOL 163 01/16/2021   TRIG 51 01/16/2021   HDL 64 01/16/2021   LDLCALC 88 01/16/2021   ALT 8 10/27/2022   AST 12 10/27/2022   NA 138 10/27/2022   K 3.9 10/27/2022   CL 104 10/27/2022   CREATININE 0.79 10/27/2022   BUN 13 10/27/2022   CO2 26 10/27/2022   TSH 4.94 10/27/2022   INR 1.1 07/30/2022    CT Abdomen Pelvis W Contrast  Result Date: 10/28/2022 CLINICAL DATA:  Chronic abdominal cramping, hematochezia, intermittent bloating and abdominal pain without constipation EXAM: CT ABDOMEN AND PELVIS WITH CONTRAST TECHNIQUE: Multidetector CT imaging of the abdomen and pelvis was performed using the standard protocol following bolus administration of intravenous contrast. RADIATION DOSE REDUCTION: This exam was performed according to the departmental dose-optimization program which includes automated exposure control, adjustment of the mA and/or kV according to patient size and/or use of iterative reconstruction technique. CONTRAST:  11m OMNIPAQUE IOHEXOL 300 MG/ML  SOLN COMPARISON:  Pelvic ultrasound September 16, 2021. FINDINGS: Lower chest: No acute abnormality. Hepatobiliary: No suspicious hepatic lesion. Gallbladder is unremarkable. No biliary ductal dilation. Pancreas: No pancreatic ductal dilation or evidence of acute inflammation. Spleen: No splenomegaly. Adrenals/Urinary Tract: Bilateral adrenal glands appear normal. No hydronephrosis. Kidneys demonstrate symmetric enhancement. Urinary bladder is unremarkable for degree of distension. Stomach/Bowel: Questionable mild wall thickening  of the gastric antrum. No pathologic dilation of small or large bowel. Surgical material along the cecal tip likely reflects prior appendectomy. Terminal ileum appears normal. Moderate colonic stool burden. No evidence of acute bowel inflammation. Vascular/Lymphatic: Normal caliber abdominal aorta. Smooth IVC contours. Portal, splenic and superior mesenteric veins are patent. No pathologic dilation of small or large bowel. Reproductive: Uterus and bilateral adnexa are within normal limits in CT appearance for reproductive age female. Other: Trace pelvic free fluid is within physiologic normal limits. Musculoskeletal: No acute osseous abnormality. IMPRESSION: 1. Questionable mild wall thickening of the gastric antrum, which could reflect gastritis in the appropriate clinical setting. 2. Moderate colonic stool burden. Electronically Signed   By: Christina Ruiz M.D.   On: 10/28/2022 10:16    Assessment & Plan:  .There are no diagnoses linked to this encounter.   I provided 30 minutes of face-to-face time during this encounter reviewing patient's last visit with me, patient's  most recent visit with cardiology,  nephrology,  and neurology,  recent surgical and non surgical procedures, previous  labs and imaging studies, counseling on currently addressed issues,  and post visit ordering to diagnostics and therapeutics .   Follow-up: No follow-ups on file.   Christina Mc, MD

## 2022-11-13 DIAGNOSIS — F909 Attention-deficit hyperactivity disorder, unspecified type: Secondary | ICD-10-CM | POA: Insufficient documentation

## 2022-11-13 DIAGNOSIS — R41840 Attention and concentration deficit: Secondary | ICD-10-CM | POA: Insufficient documentation

## 2022-11-13 DIAGNOSIS — K2970 Gastritis, unspecified, without bleeding: Secondary | ICD-10-CM | POA: Insufficient documentation

## 2022-11-13 DIAGNOSIS — F339 Major depressive disorder, recurrent, unspecified: Secondary | ICD-10-CM | POA: Insufficient documentation

## 2022-11-13 LAB — H. PYLORI ANTIBODY, IGG: H Pylori IgG: NEGATIVE

## 2022-11-13 NOTE — Assessment & Plan Note (Signed)
Advised to start PPI  .  Screening for H Pylori ag ordered

## 2022-11-13 NOTE — Assessment & Plan Note (Signed)
She has developed increased bloating unrelated to movement of bowels.  She has reported  hematochezia which is infrequent and suggestive of internal hemorrhoid bleeds.  Reviewed her CT abd and pelvis and labs,  nothing to suggest IBD.  GI referral has been made.Christina Ruiz  appt wit Virgina Jock is in June

## 2022-11-13 NOTE — Assessment & Plan Note (Signed)
Septra prescribed and taken.  She remains concerned about the residual subcutaneous nodule and is requesting dermatology referral

## 2022-11-13 NOTE — Assessment & Plan Note (Addendum)
She reports having trouble concentrating and completing tasks since high school and is requesting testing. Referring to [place] [facility]

## 2022-11-13 NOTE — Assessment & Plan Note (Signed)
She is not suicidal .  She is ambivalent about resuming prozac.  Symptoms are more consistent with anxiety

## 2022-11-19 IMAGING — CR DG CHEST 2V
2 series · 2 of 2 positions shown · non-contrast
Comparison: None.

CLINICAL DATA: Shortness of breath, dizziness, nausea, vomiting for
24 hours.

EXAM:
CHEST - 2 VIEW

[chest lat]
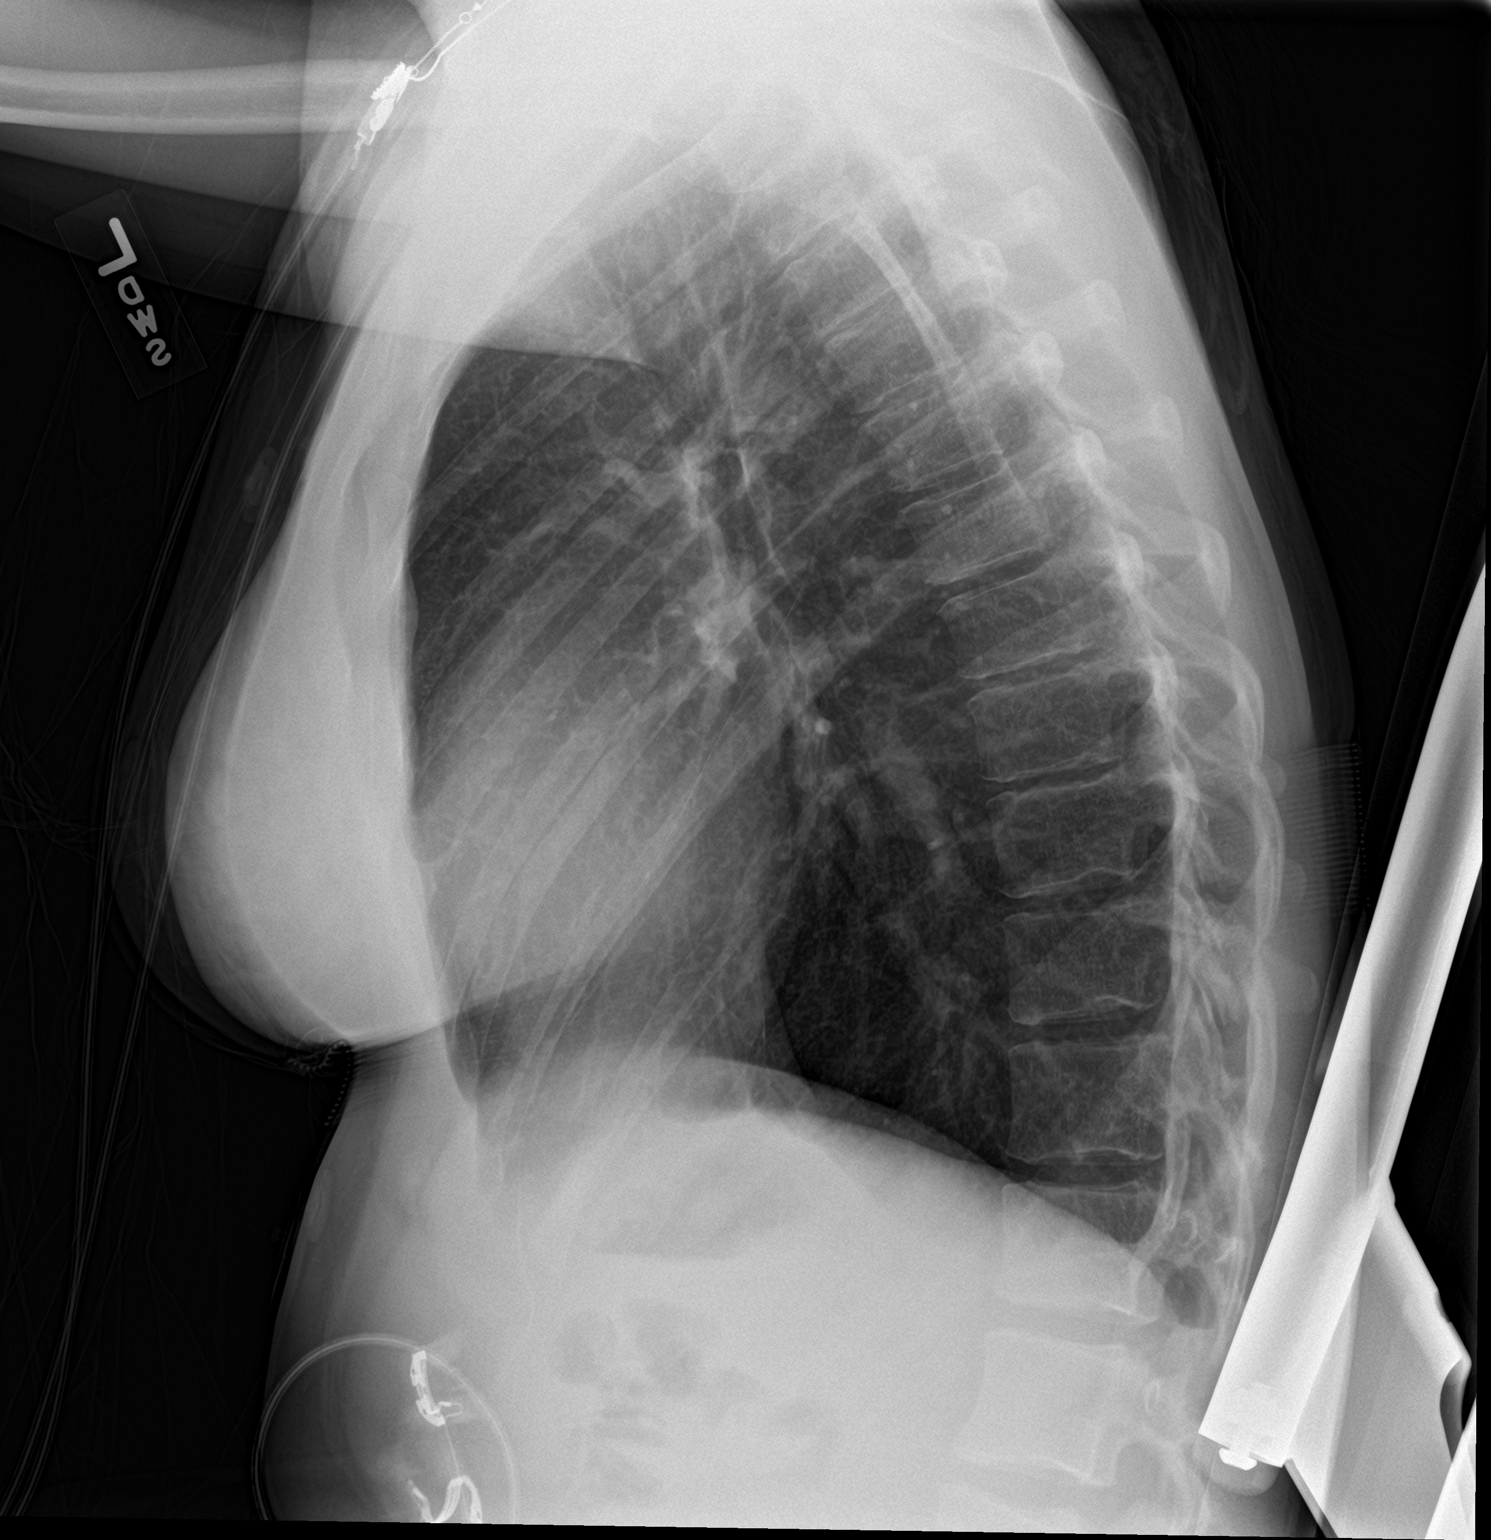

[chest ap]
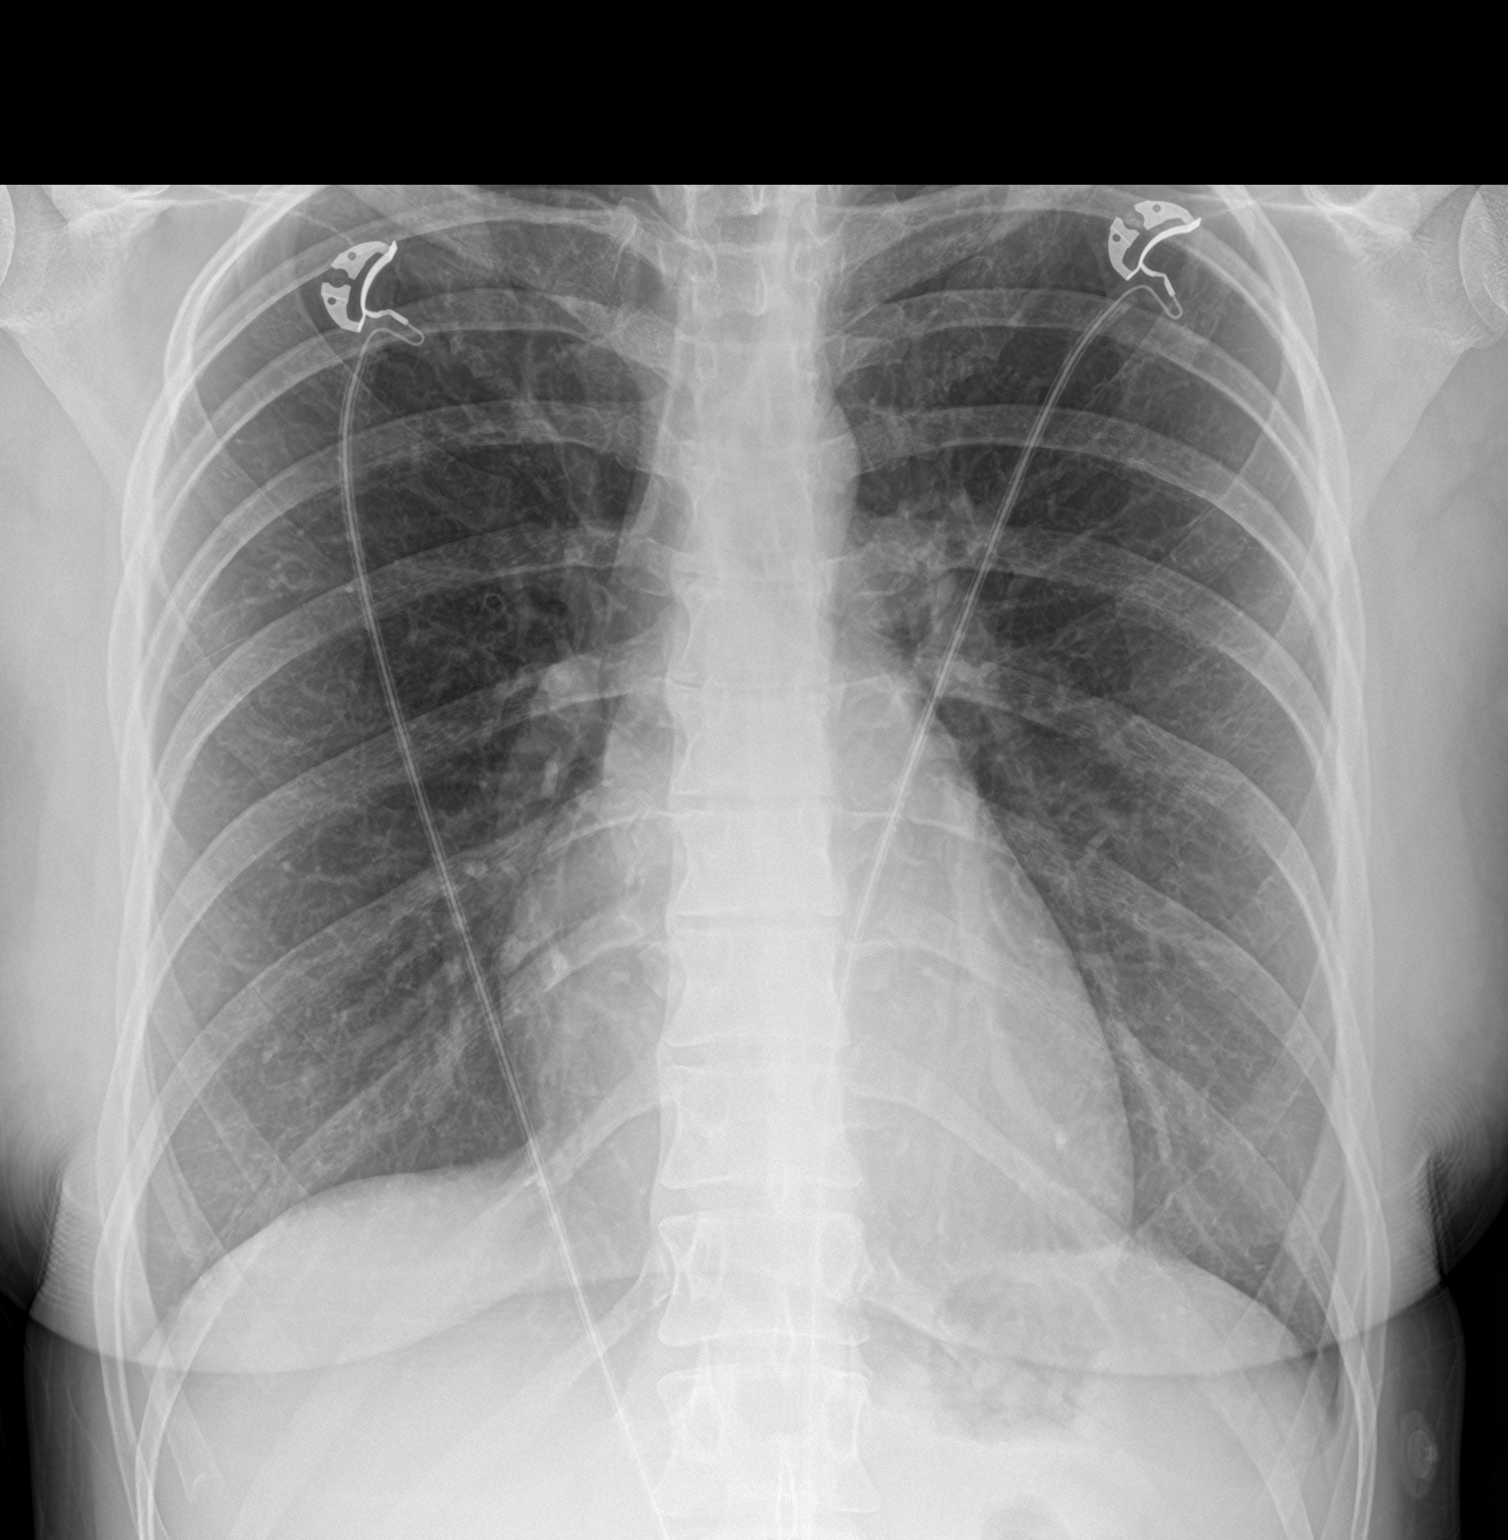

[2 of 2 positions shown; findings below may reference images not displayed]

FINDINGS: The heart size and mediastinal contours are within normal limits.

No focal consolidation. No pulmonary edema. No pleural effusion. No
pneumothorax.

No acute osseous abnormality.
IMPRESSION: No active cardiopulmonary disease.

## 2022-12-10 ENCOUNTER — Ambulatory Visit: Admitting: Internal Medicine

## 2022-12-10 ENCOUNTER — Encounter: Payer: Self-pay | Admitting: Internal Medicine

## 2022-12-10 VITALS — BP 128/72 | HR 72 | Temp 97.7°F | Ht 63.0 in | Wt 146.4 lb

## 2022-12-10 DIAGNOSIS — L309 Dermatitis, unspecified: Secondary | ICD-10-CM

## 2022-12-10 DIAGNOSIS — F5105 Insomnia due to other mental disorder: Secondary | ICD-10-CM

## 2022-12-10 DIAGNOSIS — R52 Pain, unspecified: Secondary | ICD-10-CM

## 2022-12-10 DIAGNOSIS — K2900 Acute gastritis without bleeding: Secondary | ICD-10-CM

## 2022-12-10 DIAGNOSIS — K580 Irritable bowel syndrome with diarrhea: Secondary | ICD-10-CM

## 2022-12-10 DIAGNOSIS — F409 Phobic anxiety disorder, unspecified: Secondary | ICD-10-CM

## 2022-12-10 LAB — POCT RAPID STREP A (OFFICE): Rapid Strep A Screen: NEGATIVE

## 2022-12-10 LAB — POCT INFLUENZA A/B
Influenza A, POC: NEGATIVE
Influenza B, POC: NEGATIVE

## 2022-12-10 LAB — POC COVID19 BINAXNOW: SARS Coronavirus 2 Ag: NEGATIVE

## 2022-12-10 MED ORDER — AMITRIPTYLINE HCL 10 MG PO TABS: 10.0000 mg | ORAL_TABLET | Freq: Every day | ORAL | 1 refills | Status: DC

## 2022-12-10 NOTE — Progress Notes (Unsigned)
Subjective:  Patient ID: Christina Ruiz, female    DOB: 1991/09/02  Age: 32 y.o. MRN: [id number]  CC: There were no encounter diagnoses.   HPI Havah Lavey presents for  Chief Complaint  Patient presents with   Medical Management of Chronic Issues    1 month follow up    1)  body aches : son tested positive for Strep  today after starting fever yesterday.  She has been afebrile, but having body aches since Monda.y and chills since last night.    2) concentration problems: was referred to [place] [facility] last month .  Has been contacted,  and paper work filled   3) GI:  functional diarrhea : bloating,  loose stools.Marland Kitchen  gastritis  suggested by CT  noting ":Questionable mild wall thickening of the gastric antrum " ;    no prior EGD.   PPI prescribed ;; hs been taking it for the past 3 weeks, "not helping;"  still having mild bloating,  umbilical and  suprapubic pain ,   bowel movements are variable ,  from liquid to formed.  Takes a probiotic daily in the form of yougurt or  Poppi (drink) .  Has not had any more  hematochezia .  Virgina Jock in June).  No unintentional weight loss   4) sebaceous cyst: referred to Derm for residual cyst   March 2025  !!    Cyst on perimbilical area has shrunk.  But Has psoriasis on  both elbows and bridge of nose possbile y on scalpp  5) trouble falling asleep.  Brain doesn't shut off until 1 am .  Breast feeding  has been tapering down,  son is 73 months old.  Discussed elavil traial  Outpatient Medications Prior to Visit  Medication Sig Dispense Refill   acetaminophen (TYLENOL) 325 MG tablet Take 650 mg by mouth every 6 (six) hours as needed.     pantoprazole (PROTONIX) 40 MG tablet Take 1 tablet (40 mg total) by mouth daily. 30 tablet 3   No facility-administered medications prior to visit.    Review of Systems;  Patient denies headache, fevers, malaise, unintentional weight loss, skin rash, eye pain, sinus congestion and sinus pain, sore  throat, dysphagia,  hemoptysis , cough, dyspnea, wheezing, chest pain, palpitations, orthopnea, edema, abdominal pain, nausea, melena, diarrhea, constipation, flank pain, dysuria, hematuria, urinary  Frequency, nocturia, numbness, tingling, seizures,  Focal weakness, Loss of consciousness,  Tremor, insomnia, depression, anxiety, and suicidal ideation.      Objective:  BP 128/72   Pulse 72   Temp 97.7 F (36.5 C) (Oral)   Ht 5\' 3"  (1.6 m)   Wt 146 lb 6.4 oz (66.4 kg)   SpO2 99%   BMI 25.93 kg/m   BP Readings from Last 3 Encounters:  12/10/22 128/72  11/12/22 110/68  10/27/22 110/82    Wt Readings from Last 3 Encounters:  12/10/22 146 lb 6.4 oz (66.4 kg)  11/12/22 145 lb 3.2 oz (65.9 kg)  10/27/22 149 lb 3.2 oz (67.7 kg)    Physical Exam  No results found for: "HGBA1C"  Lab Results  Component Value Date   CREATININE 0.79 10/27/2022   CREATININE 0.81 07/30/2022   CREATININE 0.73 11/25/2021    Lab Results  Component Value Date   WBC 5.5 10/27/2022   HGB 13.1 10/27/2022   HCT 39.0 10/27/2022   PLT 364.0 10/27/2022   GLUCOSE 81 10/27/2022   CHOL 163 01/16/2021   TRIG 51 01/16/2021   HDL  64 01/16/2021   LDLCALC 88 01/16/2021   ALT 8 10/27/2022   AST 12 10/27/2022   NA 138 10/27/2022   K 3.9 10/27/2022   CL 104 10/27/2022   CREATININE 0.79 10/27/2022   BUN 13 10/27/2022   CO2 26 10/27/2022   TSH 4.94 10/27/2022   INR 1.1 07/30/2022    CT Abdomen Pelvis W Contrast  Result Date: 10/28/2022 CLINICAL DATA:  Chronic abdominal cramping, hematochezia, intermittent bloating and abdominal pain without constipation EXAM: CT ABDOMEN AND PELVIS WITH CONTRAST TECHNIQUE: Multidetector CT imaging of the abdomen and pelvis was performed using the standard protocol following bolus administration of intravenous contrast. RADIATION DOSE REDUCTION: This exam was performed according to the departmental dose-optimization program which includes automated exposure control, adjustment of  the mA and/or kV according to patient size and/or use of iterative reconstruction technique. CONTRAST:  46mL OMNIPAQUE IOHEXOL 300 MG/ML  SOLN COMPARISON:  Pelvic ultrasound September 16, 2021. FINDINGS: Lower chest: No acute abnormality. Hepatobiliary: No suspicious hepatic lesion. Gallbladder is unremarkable. No biliary ductal dilation. Pancreas: No pancreatic ductal dilation or evidence of acute inflammation. Spleen: No splenomegaly. Adrenals/Urinary Tract: Bilateral adrenal glands appear normal. No hydronephrosis. Kidneys demonstrate symmetric enhancement. Urinary bladder is unremarkable for degree of distension. Stomach/Bowel: Questionable mild wall thickening of the gastric antrum. No pathologic dilation of small or large bowel. Surgical material along the cecal tip likely reflects prior appendectomy. Terminal ileum appears normal. Moderate colonic stool burden. No evidence of acute bowel inflammation. Vascular/Lymphatic: Normal caliber abdominal aorta. Smooth IVC contours. Portal, splenic and superior mesenteric veins are patent. No pathologic dilation of small or large bowel. Reproductive: Uterus and bilateral adnexa are within normal limits in CT appearance for reproductive age female. Other: Trace pelvic free fluid is within physiologic normal limits. Musculoskeletal: No acute osseous abnormality. IMPRESSION: 1. Questionable mild wall thickening of the gastric antrum, which could reflect gastritis in the appropriate clinical setting. 2. Moderate colonic stool burden. Electronically Signed   By: Dahlia Bailiff M.D.   On: 10/28/2022 10:16    Assessment & Plan:  .There are no diagnoses linked to this encounter.   I provided 30 minutes of face-to-face time during this encounter reviewing patient's last visit with me, patient's  most recent visit with cardiology,  nephrology,  and neurology,  recent surgical and non surgical procedures, previous  labs and imaging studies, counseling on currently addressed  issues,  and post visit ordering to diagnostics and therapeutics .   Follow-up: No follow-ups on file.   Crecencio Mc, MD

## 2022-12-10 NOTE — Patient Instructions (Addendum)
Add a serving of Benefiber to your daily  regimen  to see if the bowel  movements firm up .  Trial of generic Elavil for sleep/IBS.  Start with 10 mg one hour before bedtime,  increase as needed up to 50 mg nightly   I will look into a treamtent for the psoriasis

## 2022-12-11 DIAGNOSIS — L309 Dermatitis, unspecified: Secondary | ICD-10-CM | POA: Insufficient documentation

## 2022-12-11 DIAGNOSIS — F5105 Insomnia due to other mental disorder: Secondary | ICD-10-CM | POA: Insufficient documentation

## 2022-12-11 DIAGNOSIS — L409 Psoriasis, unspecified: Secondary | ICD-10-CM | POA: Insufficient documentation

## 2022-12-11 MED ORDER — CLOBETASOL PROPIONATE 0.05 % EX SOLN: 1.0000 | Freq: Two times a day (BID) | CUTANEOUS | 0 refills | Status: DC

## 2022-12-11 MED ORDER — CLOBETASOL PROPIONATE 0.05 % EX OINT: 1.0000 | TOPICAL_OINTMENT | Freq: Two times a day (BID) | CUTANEOUS | 0 refills | Status: DC

## 2022-12-11 NOTE — Assessment & Plan Note (Signed)
Continue pantoprazole.  Follow up with GI

## 2022-12-11 NOTE — Assessment & Plan Note (Addendum)
Suggested by history  and normal CT.  Advised to add benefiber to bulk up stools.  Starting amitriptyline for abd pain,  insomnia

## 2022-12-11 NOTE — Assessment & Plan Note (Signed)
Affecting elbows and scalp .  clobetasol lotion fo scalp and gel for elbow.

## 2022-12-11 NOTE — Assessment & Plan Note (Signed)
Trial of amitripytline .  She is still breast feeding but planning to stop.

## 2022-12-18 ENCOUNTER — Encounter: Payer: Self-pay | Admitting: Internal Medicine

## 2022-12-18 NOTE — Telephone Encounter (Signed)
LMTCB

## 2022-12-18 NOTE — Telephone Encounter (Signed)
Looks like pt may have been seen by OBGYN today.

## 2022-12-25 ENCOUNTER — Other Ambulatory Visit: Payer: Self-pay | Admitting: Certified Nurse Midwife

## 2022-12-25 ENCOUNTER — Other Ambulatory Visit

## 2022-12-25 DIAGNOSIS — R102 Pelvic and perineal pain: Secondary | ICD-10-CM

## 2022-12-25 DIAGNOSIS — O039 Complete or unspecified spontaneous abortion without complication: Secondary | ICD-10-CM

## 2022-12-25 NOTE — Progress Notes (Signed)
Pt reached out stating she continues to have significant pelvic pain status post probable early miscarriage. She states she is no longer bleeding. Orders placed for u/s and beta quant.   Philip Aspen, CNM

## 2022-12-26 ENCOUNTER — Encounter: Payer: Self-pay | Admitting: Certified Nurse Midwife

## 2022-12-26 ENCOUNTER — Ambulatory Visit
Admission: RE | Admit: 2022-12-26 | Discharge: 2022-12-26 | Disposition: A | Discharge status: Home / Self Care | Source: Ambulatory Visit | Attending: Certified Nurse Midwife | Admitting: Certified Nurse Midwife

## 2022-12-26 DIAGNOSIS — O039 Complete or unspecified spontaneous abortion without complication: Secondary | ICD-10-CM | POA: Diagnosis not present

## 2022-12-26 DIAGNOSIS — R102 Pelvic and perineal pain: Secondary | ICD-10-CM | POA: Diagnosis present

## 2022-12-26 LAB — BETA HCG QUANT (REF LAB): hCG Quant: <1 m[IU]/mL

## 2022-12-29 ENCOUNTER — Other Ambulatory Visit

## 2023-01-25 IMAGING — US US OB COMP +14 WK
1 series · 15 of 28 positions shown · non-contrast
Comparison: none

CLINICAL DATA: Second trimester pregnancy for fetal anatomy survey.

EXAM:
OBSTETRICAL ULTRASOUND >14 WKS

[Series 1: us ob comp + 14 wk · 15 of 97 slices shown]
[im 1/97]
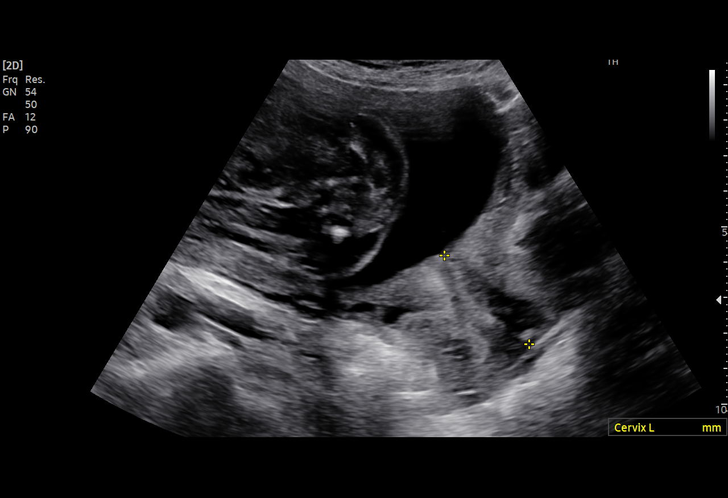
[im 8/97]
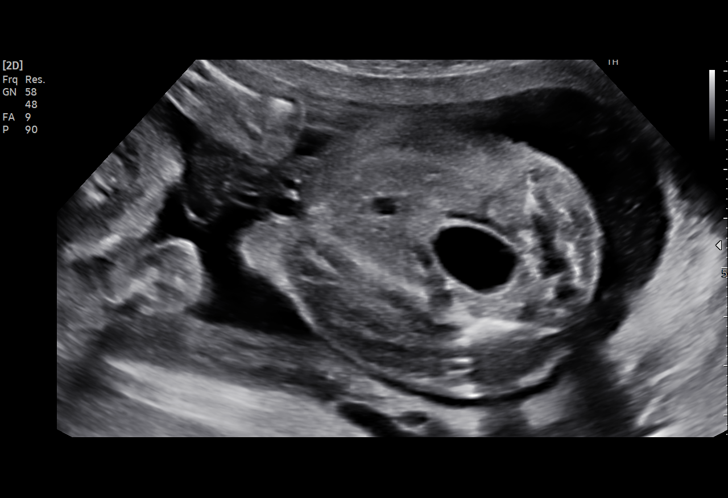
[im 15/97]
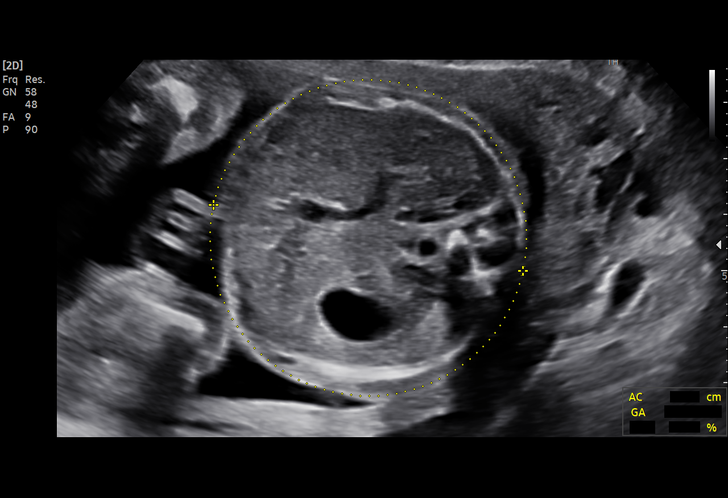
[im 22/97]
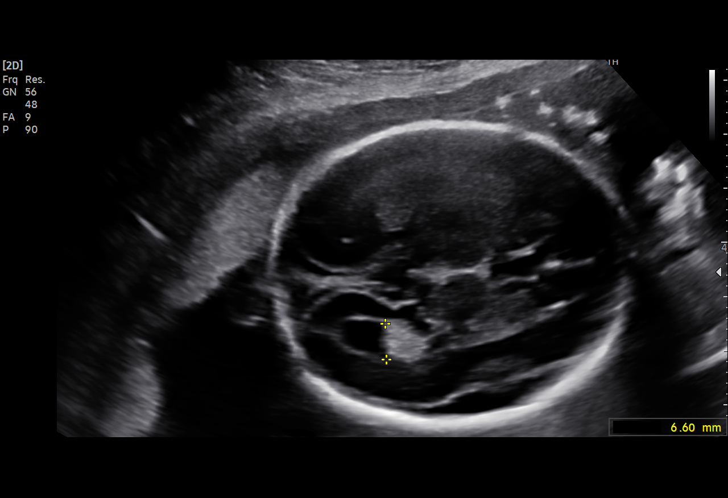
[im 29/97]
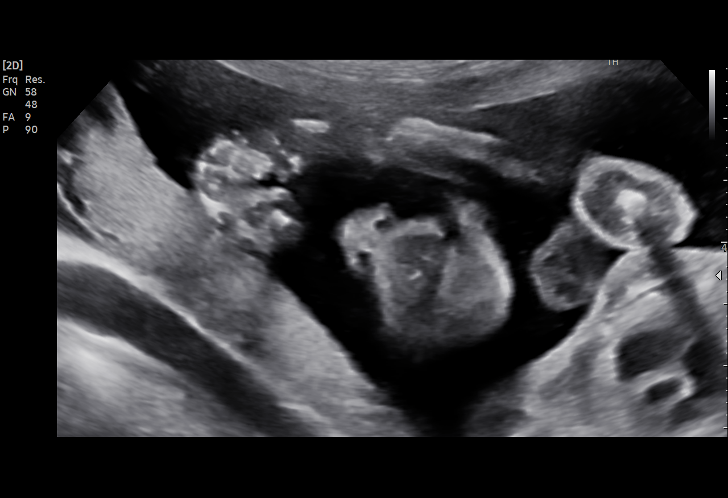
[im 36/97]
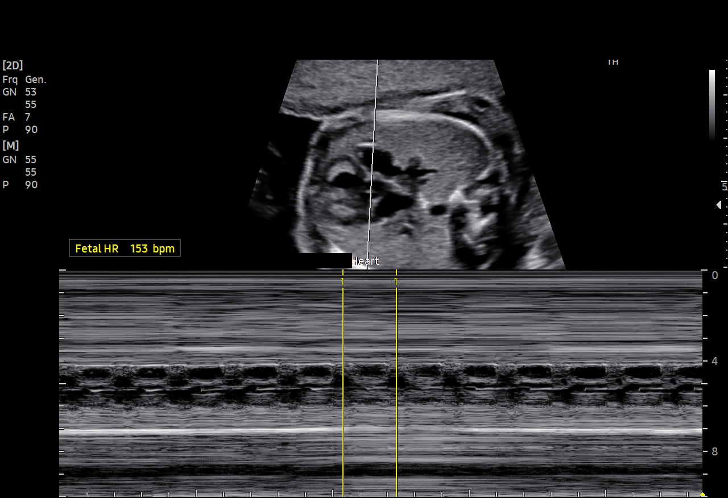
[im 43/97]
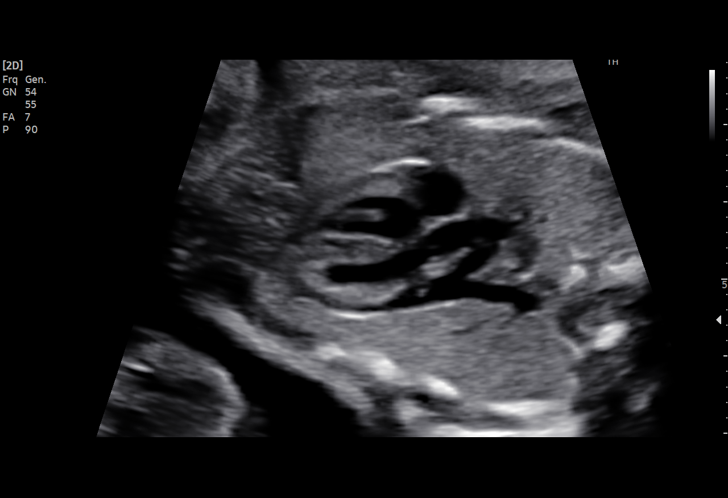
[im 50/97]
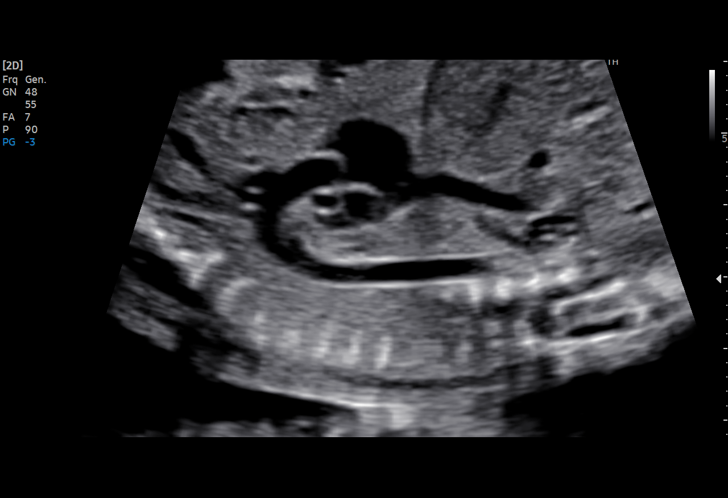
[im 54/97]
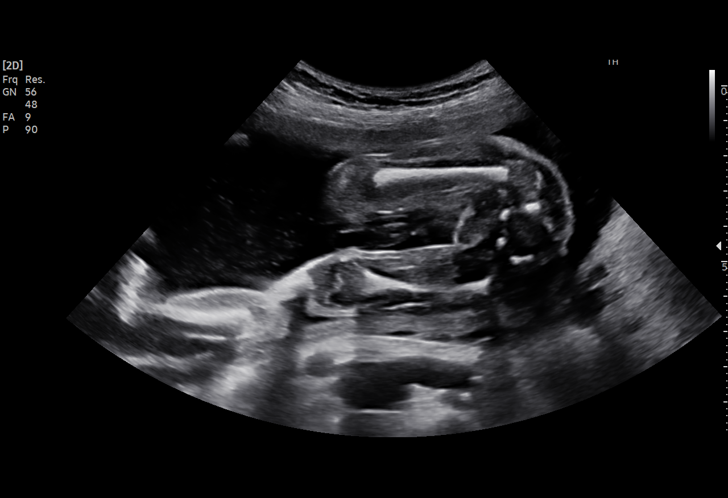
[im 61/97]
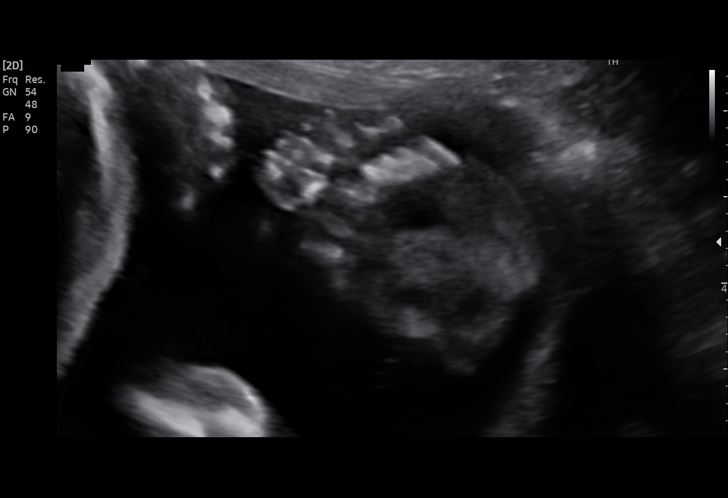
[im 68/97]
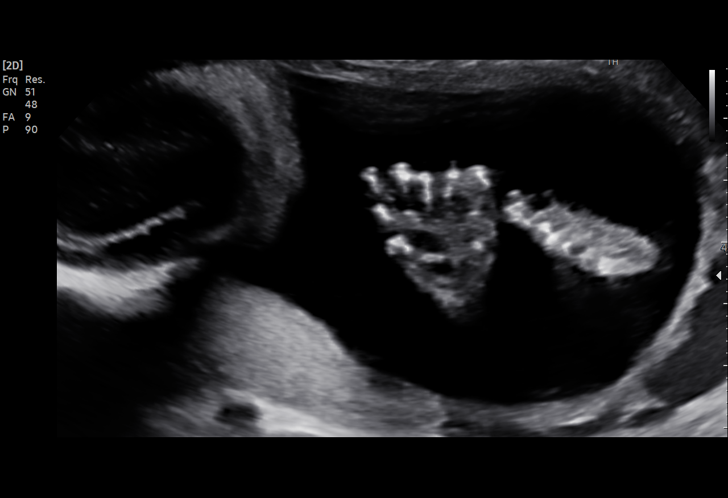
[im 75/97]
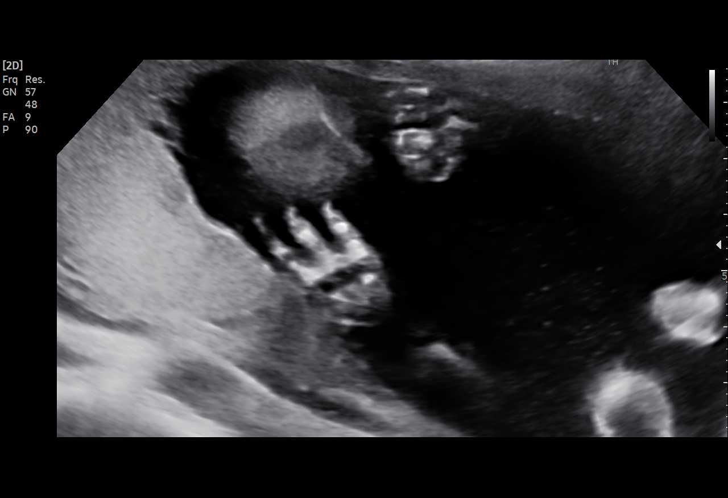
[im 82/97]
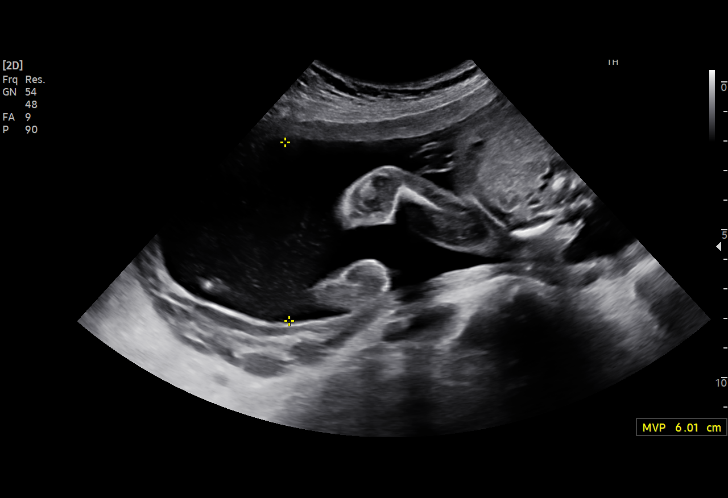
[im 89/97]
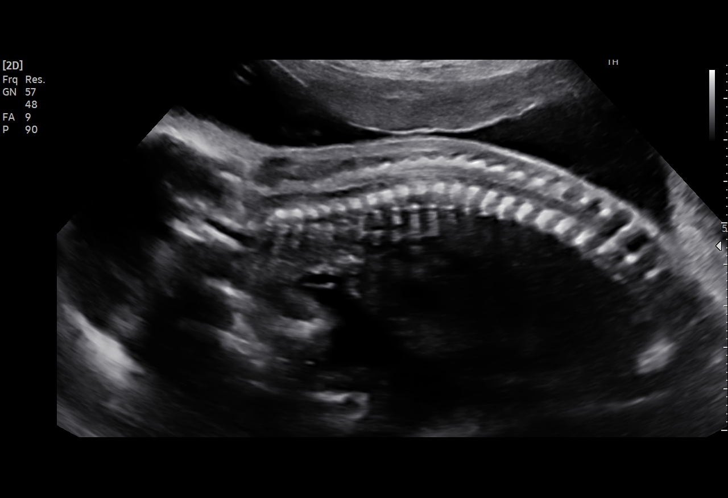
[im 97/97]
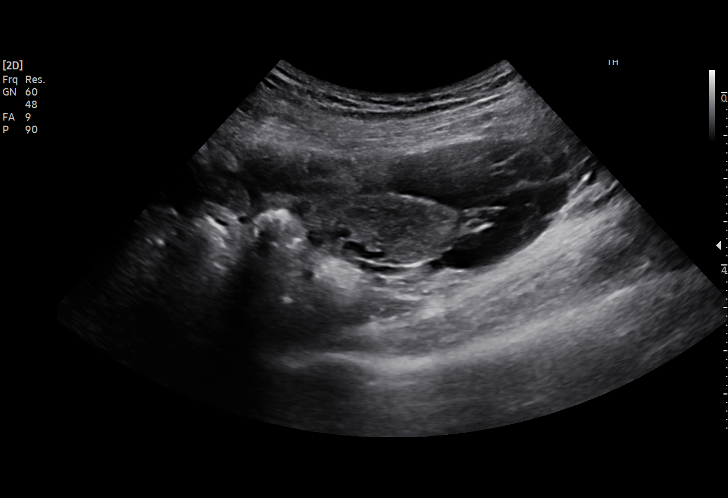

[15 of 28 positions shown; findings below may reference images not displayed]

FINDINGS: Number of Fetuses: 1

Heart Rate:  153 bpm

Movement: Yes

Presentation: Breech

Previa: No

Placental Location: Fundal

Amniotic Fluid (Subjective): Within normal limits

Amniotic Fluid (Objective):

Vertical pocket = 6.0cm

FETAL BIOMETRY

BPD: 5.4cm 22w 4d

HC:   20.4cm 22w 4d

AC:   17.7cm 22w 4d

FL:   3.9cm 22w 3d

Current Mean GA: 22w 4d US EDC: 09/21/2021

Assigned GA:  22w 1d Assigned EDC: 09/24/2021

FETAL ANATOMY

Lateral Ventricles: Appears normal

Thalami/CSP: Appears normal

Posterior Fossa:  Appears normal

Nuchal Region: Appears normal   NFT= N/A > 20 WKS

Upper Lip: Appears normal

Spine: Appears normal

4 Chamber Heart on Left: Appears normal

LVOT: Appears normal

RVOT: Appears normal

Stomach on Left: Appears normal

3 Vessel Cord: Appears normal

Cord Insertion site: Appears normal

Kidneys: Appears normal

Bladder: Appears normal

Extremities: Appears normal

Sex: Male

Maternal Findings:

Cervix:  3.4 cm TA
IMPRESSION: Assigned GA currently 22 weeks 1 day.  Appropriate fetal growth.

Unremarkable fetal anatomic survey.  No fetal anomalies identified.

## 2023-01-26 ENCOUNTER — Encounter: Admitting: Certified Nurse Midwife

## 2023-02-11 ENCOUNTER — Ambulatory Visit: Admitting: Certified Nurse Midwife

## 2023-03-12 ENCOUNTER — Ambulatory Visit: Admitting: Internal Medicine

## 2023-03-20 ENCOUNTER — Encounter: Payer: Self-pay | Admitting: Internal Medicine

## 2023-03-20 ENCOUNTER — Encounter: Payer: Self-pay | Admitting: Certified Nurse Midwife

## 2023-03-20 ENCOUNTER — Ambulatory Visit: Admitting: Internal Medicine

## 2023-03-20 ENCOUNTER — Other Ambulatory Visit (HOSPITAL_COMMUNITY)
Admission: RE | Admit: 2023-03-20 | Discharge: 2023-03-20 | Disposition: A | Discharge status: Home / Self Care | Source: Ambulatory Visit | Attending: Certified Nurse Midwife | Admitting: Certified Nurse Midwife

## 2023-03-20 ENCOUNTER — Ambulatory Visit (INDEPENDENT_AMBULATORY_CARE_PROVIDER_SITE_OTHER): Admitting: Certified Nurse Midwife

## 2023-03-20 VITALS — BP 94/69 | HR 94 | Ht 63.0 in | Wt 133.4 lb

## 2023-03-20 VITALS — BP 108/70 | HR 86 | Temp 98.5°F | Ht 63.0 in | Wt 134.0 lb

## 2023-03-20 DIAGNOSIS — Z01419 Encounter for gynecological examination (general) (routine) without abnormal findings: Secondary | ICD-10-CM | POA: Diagnosis present

## 2023-03-20 DIAGNOSIS — K580 Irritable bowel syndrome with diarrhea: Secondary | ICD-10-CM | POA: Diagnosis not present

## 2023-03-20 DIAGNOSIS — L723 Sebaceous cyst: Secondary | ICD-10-CM | POA: Diagnosis not present

## 2023-03-20 DIAGNOSIS — Z124 Encounter for screening for malignant neoplasm of cervix: Secondary | ICD-10-CM | POA: Diagnosis present

## 2023-03-20 DIAGNOSIS — L089 Local infection of the skin and subcutaneous tissue, unspecified: Secondary | ICD-10-CM

## 2023-03-20 DIAGNOSIS — L409 Psoriasis, unspecified: Secondary | ICD-10-CM

## 2023-03-20 DIAGNOSIS — F909 Attention-deficit hyperactivity disorder, unspecified type: Secondary | ICD-10-CM | POA: Diagnosis not present

## 2023-03-20 MED ORDER — CLOBETASOL PROPIONATE 0.05 % EX SOLN: 1.0000 | Freq: Two times a day (BID) | CUTANEOUS | 0 refills | Status: DC

## 2023-03-20 MED ORDER — SULFAMETHOXAZOLE-TRIMETHOPRIM 800-160 MG PO TABS: 1.0000 | ORAL_TABLET | Freq: Two times a day (BID) | ORAL | 0 refills | Status: DC

## 2023-03-20 MED ORDER — METHYLPHENIDATE HCL 10 MG PO TABS
10.0000 mg | ORAL_TABLET | Freq: Two times a day (BID) | ORAL | 0 refills | Status: DC
Start: 2023-03-20 — End: 2023-04-21
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
## 2023-03-20 NOTE — Patient Instructions (Addendum)
   Try using beano before any meal with any beans , cabbage,  Broccoli, cauliflower and brussel sprouts   Trial of Ritalin at lowest dose  10 mg once or twice daily (2nd dose by 2-3 pm to avoid insomnia)  Try the clobetasol solution on the scalp  Use the Septra antibiotic if you get another painful lump under the skin (sebaceous cyst)

## 2023-03-20 NOTE — Assessment & Plan Note (Signed)
Trial of Ritalin lowest dose

## 2023-03-20 NOTE — Assessment & Plan Note (Addendum)
Affecting elbows, possible her nuchal scalp. Trial of clobetasol solution

## 2023-03-20 NOTE — Assessment & Plan Note (Signed)
Low Fod map diet advised by Timothy Lasso  recommend adding Beano for mealtime intolerances.  For EGD/colonoscopy in mid July

## 2023-03-20 NOTE — Progress Notes (Signed)
Subjective:  Patient ID: Christina Ruiz, female    DOB: 1991-05-25  Age: 32 y.o. MRN: [id number]  CC: The primary encounter diagnosis was Psoriasis. 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   HPI Karianna Pacha presents for  Chief Complaint  Patient presents with   Medical Management of Chronic Issues    3 month follow up    1) bloating,  abd pian, hematochezia:  saw Timothy Lasso,  has  EGD/colonoscopy July 24.   Still Constipated. Stools .  Every 3 days despite using  miralax daily f;  or the past 2 weeks  alternating with "smooth move" laxative which works overnight .  Avoiding beans to excessive bloating . Has not tried beano.   Appetite has diminished ,  only eats with company .  No fear of eating.  Eats fine socially   weight loss noted.  Not intentional   2) Distrubed concentration Had the ADD evaluation by [facility] in May.  No report in chart but per patient she was told :  " No ADD.  Yes to Sensory processing disorder.  However she was found to be 98% less attentive than peers,  98% more impulsive.  Wants a second opinion  vs trial of medication.  Can't even watch a television show without having to rewind.  Son has ADD   3) Psoriasis:  did not try the clobetasol solution for scalp yet.  Derm appt in 2025     Outpatient Medications Prior to Visit  Medication Sig Dispense Refill   acetaminophen (TYLENOL) 325 MG tablet Take 650 mg by mouth every 6 (six) hours as needed.     amitriptyline (ELAVIL) 10 MG tablet Take 1 tablet (10 mg total) by mouth at bedtime. (Patient not taking: Reported on 03/20/2023) 90 tablet 1   clobetasol (TEMOVATE) 0.05 % external solution Apply 1 Application topically 2 (two) times daily. To scalp (Patient not taking: Reported on 03/20/2023) 50 mL 0   clobetasol ointment (TEMOVATE) 0.05 % Apply 1 Application topically 2  (two) times daily. (Patient not taking: Reported on 03/20/2023) 30 g 0   pantoprazole (PROTONIX) 40 MG tablet Take 1 tablet (40 mg total) by mouth daily. (Patient not taking: Reported on 03/20/2023) 30 tablet 3   No facility-administered medications prior to visit.    Review of Systems;  Patient denies headache, fevers, malaise, unintentional weight loss, skin rash, eye pain, sinus congestion and sinus pain, sore throat, dysphagia,  hemoptysis , cough, dyspnea, wheezing, chest pain, palpitations, orthopnea, edema, abdominal pain, nausea, melena, diarrhea, constipation, flank pain, dysuria, hematuria, urinary  Frequency, nocturia, numbness, tingling, seizures,  Focal weakness, Loss of consciousness,  Tremor, insomnia, depression, anxiety, and suicidal ideation.      Objective:  BP 108/70   Pulse 86   Temp 98.5 F (36.9 C) (Oral)   Ht 5\' 3"  (1.6 m)   Wt 134 lb (60.8 kg)   LMP 02/20/2023 (Exact Date)   SpO2 98%   BMI 23.74 kg/m   BP Readings from Last 3 Encounters:  03/20/23 108/70  03/20/23 94/69  12/10/22 128/72    Wt Readings from Last 3 Encounters:  03/20/23 134 lb (60.8 kg)  03/20/23 133 lb 6.4 oz (60.5 kg)  12/10/22 146 lb 6.4 oz (66.4 kg)    Physical Exam Vitals reviewed.  Constitutional:      General: She is not in acute distress.  Appearance: Normal appearance. She is normal weight. She is not ill-appearing, toxic-appearing or diaphoretic.  HENT:     Head: Normocephalic.  Eyes:     General: No scleral icterus.       Right eye: No discharge.        Left eye: No discharge.     Conjunctiva/sclera: Conjunctivae normal.  Cardiovascular:     Rate and Rhythm: Normal rate and regular rhythm.     Heart sounds: Normal heart sounds.  Pulmonary:     Effort: Pulmonary effort is normal. No respiratory distress.     Breath sounds: Normal breath sounds.  Musculoskeletal:        General: Normal range of motion.  Skin:    General: Skin is warm and dry.     Findings: Rash  present. Rash is scaling.          Comments: Silver scales on elbows , scalp line at nape of neck on left side   Neurological:     General: No focal deficit present.     Mental Status: She is alert and oriented to person, place, and time. Mental status is at baseline.  Psychiatric:        Mood and Affect: Mood normal.        Behavior: Behavior normal.        Thought Content: Thought content normal.        Judgment: Judgment normal.    No results found for: "HGBA1C"  Lab Results  Component Value Date   CREATININE 0.79 10/27/2022   CREATININE 0.81 07/30/2022   CREATININE 0.73 11/25/2021    Lab Results  Component Value Date   WBC 5.5 10/27/2022   HGB 13.1 10/27/2022   HCT 39.0 10/27/2022   PLT 364.0 10/27/2022   GLUCOSE 81 10/27/2022   CHOL 163 01/16/2021   TRIG 51 01/16/2021   HDL 64 01/16/2021   LDLCALC 88 01/16/2021   ALT 8 10/27/2022   AST 12 10/27/2022   NA 138 10/27/2022   K 3.9 10/27/2022   CL 104 10/27/2022   CREATININE 0.79 10/27/2022   BUN 13 10/27/2022   CO2 26 10/27/2022   TSH 4.94 10/27/2022   INR 1.1 07/30/2022    US PELVIC COMPLETE WITH TRANSVAGINAL  Result Date: 12/26/2022 CLINICAL DATA:  Pelvic pain post miscarriage EXAM: TRANSABDOMINAL AND TRANSVAGINAL ULTRASOUND OF PELVIS TECHNIQUE: Both transabdominal and transvaginal ultrasound examinations of the pelvis were performed. Transabdominal technique was performed for global imaging of the pelvis including uterus, ovaries, adnexal regions, and pelvic cul-de-sac. It was necessary to proceed with endovaginal exam following the transabdominal exam to visualize the uterus endometrium ovaries. COMPARISON:  CT 10/28/2022 FINDINGS: Uterus Measurements: 7.7 x 3.8 x 5.2 cm = volume: 78.5 mL. No fibroids or other mass visualized. Endometrium Thickness: 6.7 mm.  No focal abnormality visualized. Right ovary Measurements: 2.7 x 2.4 x 2.1 cm = volume: 7.3 mL. Normal appearance/no adnexal mass. Left ovary Measurements: 1.7  x 1.3 x 1.7 cm = volume: 2.0 mL. Normal appearance/no adnexal mass. Other findings No abnormal free fluid. IMPRESSION: Negative pelvic ultrasound. Electronically Signed   By: Jasmine Pang M.D.   On: 12/26/2022 15:02    Assessment & Plan:  .Psoriasis Assessment & Plan: Affecting elbows, possible her nuchal scalp. 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   Cyst has  resolved but has an intermittent occurrence on scalp, not present today. Marland Kitchen  Keep  dermatology appt. Septra DS rx refilled ofr next occurrence    Irritable bowel syndrome with diarrhea Assessment & Plan: Low Fod map diet advised by Timothy Lasso  recommend adding Beano for mealtime intolerances.  For EGD/colonoscopy in mid July   Other orders -     Sulfamethoxazole-Trimethoprim; Take 1 tablet by mouth 2 (two) times daily.  Dispense: 14 tablet; Refill: 0 -     Clobetasol Propionate; Apply 1 Application topically 2 (two) times daily. To scalp  Dispense: 50 mL; Refill: 0 -     Methylphenidate HCl; Take 1 tablet (10 mg total) by mouth 2 (two) times daily.  Dispense: 60 tablet; Refill: 0     I provided 34 minutes of face-to-face time during this encounter reviewing patient's last visit with me, patient's  most recent visit with gastroenterology  previous  labs and imaging studies, counseling on currently addressed issues,  and post visit ordering to diagnostics and therapeutics .   Follow-up: Return in about 4 weeks (around 04/17/2023) for ADD medication start +.   Sherlene Shams, MD

## 2023-03-20 NOTE — Assessment & Plan Note (Signed)
Septra prescribed and taken.   Cyst has resolved but has an intermittent occurrence on scalp, not present today. Marland Kitchen  Keep  dermatology appt. Septra DS rx refilled ofr next occurrence

## 2023-03-20 NOTE — Patient Instructions (Signed)

## 2023-03-23 ENCOUNTER — Telehealth: Payer: Self-pay

## 2023-03-23 NOTE — Telephone Encounter (Signed)
-----   Message from Sherlene Shams, MD sent at 03/23/2023  9:32 AM EDT ----- Regarding: ADD evaluation records needed Please request the records from recent eval by [facility] (unless you can find them in chart,  I can't)  Thanks

## 2023-03-23 NOTE — Telephone Encounter (Signed)
Records have been requested.

## 2023-03-24 LAB — CYTOLOGY - PAP: Diagnosis: NEGATIVE

## 2023-04-15 ENCOUNTER — Ambulatory Visit

## 2023-04-15 DIAGNOSIS — K2950 Unspecified chronic gastritis without bleeding: Secondary | ICD-10-CM

## 2023-04-15 DIAGNOSIS — K648 Other hemorrhoids: Secondary | ICD-10-CM

## 2023-04-15 DIAGNOSIS — K635 Polyp of colon: Secondary | ICD-10-CM | POA: Diagnosis not present

## 2023-04-21 ENCOUNTER — Telehealth (INDEPENDENT_AMBULATORY_CARE_PROVIDER_SITE_OTHER): Admitting: Internal Medicine

## 2023-04-21 ENCOUNTER — Encounter: Payer: Self-pay | Admitting: Internal Medicine

## 2023-04-21 VITALS — Ht 63.0 in | Wt 131.0 lb

## 2023-04-21 DIAGNOSIS — R41840 Attention and concentration deficit: Secondary | ICD-10-CM

## 2023-04-21 DIAGNOSIS — K580 Irritable bowel syndrome with diarrhea: Secondary | ICD-10-CM

## 2023-04-21 DIAGNOSIS — K2900 Acute gastritis without bleeding: Secondary | ICD-10-CM | POA: Diagnosis not present

## 2023-04-21 DIAGNOSIS — F901 Attention-deficit hyperactivity disorder, predominantly hyperactive type: Secondary | ICD-10-CM | POA: Diagnosis not present

## 2023-04-21 DIAGNOSIS — L409 Psoriasis, unspecified: Secondary | ICD-10-CM

## 2023-04-21 MED ORDER — OMEPRAZOLE 40 MG PO CPDR: 40.0000 mg | DELAYED_RELEASE_CAPSULE | Freq: Every day | ORAL | 3 refills | Status: DC

## 2023-04-21 MED ORDER — CLOBETASOL PROPIONATE 0.05 % EX SOLN: 1.0000 | Freq: Two times a day (BID) | CUTANEOUS | 5 refills | Status: DC

## 2023-04-21 MED ORDER — METHYLPHENIDATE HCL 20 MG PO TABS
20.0000 mg | ORAL_TABLET | Freq: Two times a day (BID) | ORAL | 0 refills | Status: DC
Start: 2023-04-21 — End: 2024-01-29

## 2023-04-21 NOTE — Assessment & Plan Note (Signed)
[facility] diagnosis of [facility].
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
## 2023-04-22 NOTE — Assessment & Plan Note (Signed)
Affecting elbows, possible her nuchal scalp. Improved with clobetasol. Refills of clobetasol solution requested

## 2023-04-22 NOTE — Assessment & Plan Note (Signed)
AWAITING results of celiac biopsies

## 2023-04-22 NOTE — Assessment & Plan Note (Signed)
PPI prescribed.  EGD done,  biopsies pending

## 2023-06-08 ENCOUNTER — Other Ambulatory Visit: Payer: Self-pay | Admitting: Internal Medicine

## 2023-06-08 DIAGNOSIS — R41840 Attention and concentration deficit: Secondary | ICD-10-CM

## 2023-06-09 MED ORDER — METHYLPHENIDATE HCL 20 MG PO TABS: 20.0000 mg | ORAL_TABLET | Freq: Two times a day (BID) | ORAL | 0 refills | Status: DC

## 2023-07-09 ENCOUNTER — Telehealth: Admitting: Nurse Practitioner

## 2023-07-09 ENCOUNTER — Encounter: Payer: Self-pay | Admitting: Internal Medicine

## 2023-07-09 VITALS — Ht 63.0 in | Wt 128.0 lb

## 2023-07-09 DIAGNOSIS — J989 Respiratory disorder, unspecified: Secondary | ICD-10-CM | POA: Insufficient documentation

## 2023-07-09 MED ORDER — DOXYCYCLINE HYCLATE 100 MG PO TABS
100.0000 mg | ORAL_TABLET | Freq: Two times a day (BID) | ORAL | 0 refills | Status: DC
Start: 2023-07-09 — End: 2024-01-29

## 2023-07-09 MED ORDER — ALBUTEROL SULFATE HFA 108 (90 BASE) MCG/ACT IN AERS
2.0000 | INHALATION_SPRAY | Freq: Four times a day (QID) | RESPIRATORY_TRACT | 0 refills | Status: DC | PRN
Start: 2023-07-09 — End: 2024-01-27

## 2023-07-09 MED ORDER — METHYLPREDNISOLONE 4 MG PO TBPK
ORAL_TABLET | ORAL | 0 refills | Status: DC
Start: 2023-07-09 — End: 2024-01-29
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
## 2023-07-09 NOTE — Telephone Encounter (Signed)
Patient just called and she left a message on MyChart. She was hoping she can get some medication she has a bad cough.

## 2023-07-09 NOTE — Assessment & Plan Note (Signed)
Will treat with Doxy 100 mg BID x 7 days and MDP. Albuterol inhaler PRN for shortness of breath and wheezing. She can continue Tylenol and Motrin PRN. Encouraged adequate fluid intake. Strict precautions given to patient. If her symptoms persist she will contact to have a chest x-ray.

## 2023-07-14 ENCOUNTER — Other Ambulatory Visit: Payer: Self-pay | Admitting: Nurse Practitioner

## 2023-07-14 DIAGNOSIS — J989 Respiratory disorder, unspecified: Secondary | ICD-10-CM

## 2023-07-14 MED ORDER — PSEUDOEPH-BROMPHEN-DM 30-2-10 MG/5ML PO SYRP
5.0000 mL | ORAL_SOLUTION | Freq: Four times a day (QID) | ORAL | 0 refills | Status: DC | PRN
Start: 2023-07-14 — End: 2024-01-29

## 2023-07-14 NOTE — Telephone Encounter (Signed)
Pt stated she still have a cough

## 2023-07-14 NOTE — Telephone Encounter (Signed)
Patient states she had a visit with Bethanie Dicker, NP, for this issue on 07/09/2023.  Patient states some of her symptoms have improved, but she has a consistent, lingering, dry cough.  Patient states she did cough up an orange/yellow blob yesterday.  Patient states she would like to know what Bethanie Dicker, NP, recommends.

## 2023-11-18 ENCOUNTER — Encounter: Payer: Self-pay | Admitting: Certified Nurse Midwife

## 2023-11-25 ENCOUNTER — Encounter: Payer: Self-pay | Admitting: Dermatology

## 2023-11-25 ENCOUNTER — Ambulatory Visit (INDEPENDENT_AMBULATORY_CARE_PROVIDER_SITE_OTHER): Admitting: Dermatology

## 2023-11-25 DIAGNOSIS — Z7189 Other specified counseling: Secondary | ICD-10-CM

## 2023-11-25 DIAGNOSIS — L408 Other psoriasis: Secondary | ICD-10-CM

## 2023-11-25 DIAGNOSIS — L219 Seborrheic dermatitis, unspecified: Secondary | ICD-10-CM

## 2023-11-25 DIAGNOSIS — L409 Psoriasis, unspecified: Secondary | ICD-10-CM

## 2023-11-25 DIAGNOSIS — Z79899 Other long term (current) drug therapy: Secondary | ICD-10-CM

## 2023-11-25 DIAGNOSIS — L905 Scar conditions and fibrosis of skin: Secondary | ICD-10-CM

## 2023-11-25 DIAGNOSIS — L719 Rosacea, unspecified: Secondary | ICD-10-CM

## 2023-11-25 MED ORDER — ZORYVE 0.3 % EX FOAM: 1.0000 | Freq: Every day | CUTANEOUS | 3 refills | Status: DC

## 2023-11-25 NOTE — Patient Instructions (Addendum)
 Start Zoryve foam once daily to affected areas on elbows, scalp and face.      Your prescription was sent to [facility] in [place]. A representative from [facility] will contact you within 3 business hours to verify your address and insurance information to schedule a free delivery. If for any reason you do not receive a phone call from them, please reach out to them. Their phone number is [phone] and their hours are Monday-Friday 9:00 am-5:00 pm.      Gentle Skin Care Guide  1. Bathe no more than once a day.  2. Avoid bathing in hot water  3. Use a mild soap like Dove, Vanicream, Cetaphil, CeraVe. Can use Lever 2000 or Cetaphil antibacterial soap  4. Use soap only where you need it. On most days, use it under your arms, between your legs, and on your feet. Let the water rinse other areas unless visibly dirty.  5. When you get out of the bath/shower, use a towel to gently blot your skin dry, don't rub it.  6. While your skin is still a little damp, apply a moisturizing cream such as Vanicream, CeraVe, Cetaphil, Eucerin, Sarna lotion or plain Vaseline Jelly. For hands apply Neutrogena [place] Hand Cream or Excipial Hand Cream.  7. Reapply moisturizer any time you start to itch or feel dry.  8. Sometimes using free and clear laundry detergents can be helpful. Fabric softener sheets should be avoided. Downy Free & Gentle liquid, or any liquid fabric softener that is free of dyes and perfumes, it acceptable to use  9. If your doctor has given you prescription creams you may apply moisturizers over them        Due to recent changes in healthcare laws, you may see results of your pathology and/or laboratory studies on MyChart before the doctors have had a chance to review them. We understand that in some cases there may be results that are confusing or concerning to you. Please understand that not all results are received at the same time and often the doctors may need  to interpret multiple results in order to provide you with the best plan of care or course of treatment. Therefore, we ask that you please give [place] 2 business days to thoroughly review all your results before contacting the office for clarification. Should we see a critical lab result, you will be contacted sooner.   If You Need Anything After Your Visit  If you have any questions or concerns for your doctor, please call our main line at [phone] and press option 4 to reach your doctor's medical assistant. If no one answers, please leave a voicemail as directed and we will return your call as soon as possible. Messages left after 4 pm will be answered the following business day.   You may also send [place] a message via MyChart. We typically respond to MyChart messages within 1-2 business days.  For prescription refills, please ask your pharmacy to contact our office. Our fax number is [phone].  If you have an urgent issue when the clinic is closed that cannot wait until the next business day, you can page your doctor at the number below.    Please note that while we do our best to be available for urgent issues outside of office hours, we are not available 24/7.   If you have an urgent issue and are unable to reach [place], you may choose to seek medical care at your doctor's office, retail clinic,  urgent care center, or emergency room.  If you have a medical emergency, please immediately call 911 or go to the emergency department.  Pager Numbers  - Dr. Gwen Pounds: [phone]  - Dr. Roseanne Reno: [phone]  - Dr. Katrinka Blazing: [phone]   In the event of inclement weather, please call our main line at [phone] for an update on the status of any delays or closures.  Dermatology Medication Tips: Please keep the boxes that topical medications come in in order to help keep track of the instructions about where and how to use these. Pharmacies typically print the medication instructions only on  the boxes and not directly on the medication tubes.   If your medication is too expensive, please contact our office at [phone] option 4 or send [place] a message through MyChart.   We are unable to tell what your co-pay for medications will be in advance as this is different depending on your insurance coverage. However, we may be able to find a substitute medication at lower cost or fill out paperwork to get insurance to cover a needed medication.   If a prior authorization is required to get your medication covered by your insurance company, please allow [place] 1-2 business days to complete this process.  Drug prices often vary depending on where the prescription is filled and some pharmacies may offer cheaper prices.  The website www.goodrx.com contains coupons for medications through different pharmacies. The prices here do not account for what the cost may be with help from insurance (it may be cheaper with your insurance), but the website can give you the price if you did not use any insurance.  - You can print the associated coupon and take it with your prescription to the pharmacy.  - You may also stop by our office during regular business hours and pick up a GoodRx coupon card.  - If you need your prescription sent electronically to a different pharmacy, notify our office through [facility] or by phone at [phone] option 4.     Si Usted Necesita Algo Despus de Su Visita  Tambin puede enviarnos un mensaje a travs de Clinical cytogeneticist. Por lo general respondemos a los mensajes de MyChart en el transcurso de 1 a 2 das hbiles.  Para renovar recetas, por favor pida a su farmacia que se ponga en contacto con nuestra oficina. Annie Sable de fax es [place] [phone].  Si tiene un asunto urgente cuando la clnica est cerrada y que no puede esperar hasta el siguiente da hbil, puede llamar/localizar a su doctor(a) al nmero que aparece a continuacin.   Por favor, tenga en cuenta que  aunque hacemos todo lo posible para estar disponibles para asuntos urgentes fuera del horario de [place], no estamos disponibles las 24 horas del da, los 7 [address]  [address] de la [place].   Si tiene un problema urgente y no puede comunicarse con nosotros, puede optar por buscar atencin mdica  en el consultorio de su doctor(a), en una clnica privada, en un centro de atencin urgente o en una sala de emergencias.  Si tiene Engineer, drilling, por favor llame inmediatamente al 911 o vaya a la sala de emergencias.  Nmeros de bper  - Dr. Gwen Pounds: [phone]  - Dra. Roseanne Reno: [phone]  - Dr. Katrinka Blazing: [phone]   En caso de inclemencias del tiempo, por favor llame a Lacy Duverney principal al [phone] para una actualizacin sobre el [place] de cualquier retraso o cierre.  Consejos para la medicacin en dermatologa: Por favor, guarde  las cajas en las que vienen los medicamentos de uso tpico para ayudarle a seguir las instrucciones sobre dnde y cmo usarlos. Las farmacias generalmente imprimen las instrucciones del medicamento slo en las cajas y no directamente en los tubos del [place].   Si su medicamento es muy caro, por favor, pngase en contacto con Rolm Gala llamando al [phone] y presione la opcin 4 o envenos un mensaje a travs de Clinical cytogeneticist.   No podemos decirle cul ser su copago por los medicamentos por adelantado ya que esto es diferente dependiendo de la cobertura de su seguro. Sin embargo, es posible que podamos encontrar un medicamento sustituto a Audiological scientist un formulario para que el seguro cubra el medicamento que se considera necesario.   Si se requiere una autorizacin previa para que su compaa de seguros [place] su medicamento, por favor permtanos de 1 a 2 das hbiles para completar [address]  Los precios de los medicamentos varan con frecuencia dependiendo del Environmental consultant de dnde se surte la receta y alguna farmacias pueden ofrecer precios ms  baratos.  El sitio web www.goodrx.com tiene cupones para medicamentos de Health and safety inspector. Los precios aqu no tienen en cuenta lo que podra costar con la ayuda del seguro (puede ser ms barato con su seguro), pero el sitio web puede darle el precio si no utiliz Tourist information centre manager.  - Puede imprimir el cupn correspondiente y llevarlo con su receta a la farmacia.  - Tambin puede pasar por nuestra oficina durante el horario de atencin regular y Education officer, museum una tarjeta de cupones de GoodRx.  - Si necesita que su receta se enve electrnicamente a una farmacia diferente, informe a nuestra oficina a travs de MyChart de [place] o por telfono llamando al [phone] y presione la opcin 4.

## 2023-11-25 NOTE — Progress Notes (Deleted)
   New Patient Visit   Subjective  Christina Ruiz is a 33 y.o. female who presents for the following: Spot  The patient has spots, moles and lesions to be evaluated, some may be new or changing and the patient may have concern these could be cancer. ***   The following portions of the chart were reviewed this encounter and updated as appropriate: medications, allergies, medical history  Review of Systems:  No other skin or systemic complaints except as noted in HPI or Assessment and Plan.  Objective  Well appearing patient in no apparent distress; mood and affect are within normal limits.  A focused examination was performed of the following areas: *** Relevant physical exam findings are noted in the Assessment and Plan.    Assessment & Plan      No follow-ups on file.  I, Lawson Radar, CMA, am acting as scribe for Armida Sans, MD.   Documentation: I have reviewed the above documentation for accuracy and completeness, and I agree with the above.  Armida Sans, MD

## 2023-11-25 NOTE — Progress Notes (Signed)
   New Patient Visit   Subjective  Christina Ruiz is a 33 y.o. female who presents for the following: Psoriasis. Dur: >15 years. Elbows, face, scalp. Sometimes has psoriasis on ears. Has had topical treatments in the past. Currently using Clobetasol solution as needed to affected areas.   Check spots behind left ear. C/O spots popping up and staying for years. States her previous dermatologist in [place] biopsied one lesion and told her it wasn't dangerous but she's not sure of the actual diagnosis.   The following portions of the chart were reviewed this encounter and updated as appropriate: medications, allergies, medical history  Review of Systems:  No other skin or systemic complaints except as noted in HPI or Assessment and Plan.  Objective  Well appearing patient in no apparent distress; mood and affect are within normal limits.  Areas Examined: Face, scalp, neck, elbows  Relevant exam findings are noted in the Assessment and Plan.                Assessment & Plan   PSORIASIS Well-demarcated erythematous papules/plaques with silvery scale, guttate pink scaly papules at elbows. 10% BSA. Chronic and persistent condition with duration or expected duration over one year. Condition is bothersome/symptomatic for patient. Currently flared. Patient denies joint pain Treatment Plan: Start Zoryve foam once daily to affected areas of psoriasis.  Counseling on psoriasis and coordination of care  psoriasis is a chronic non-curable, but treatable genetic/hereditary disease that may have other systemic features affecting other organ systems such as joints (Psoriatic Arthritis). It is associated with an increased risk of inflammatory bowel disease, heart disease, non-alcoholic fatty liver disease, and depression.  Treatments include light and laser treatments; topical medications; and systemic medications including oral and injectables.   SEBORRHEIC DERMATITIS Exam: Pink patches  with greasy scale at face and scalp Chronic and persistent condition with duration or expected duration over one year. Condition is bothersome/symptomatic for patient. Currently flared. Seborrheic Dermatitis is a chronic persistent rash characterized by pinkness and scaling most commonly of the mid face but also can occur on the scalp (dandruff), ears; mid chest, mid back and groin.  It tends to be exacerbated by stress and cooler weather.  People who have neurologic disease may experience new onset or exacerbation of existing seborrheic dermatitis.  The condition is not curable but treatable and can be controlled. Treatment Plan: Start Zoryve foam once daily to affected areas on scalp and face.    Sebo-psoriasis with rosacea  Exam: erythema with scale at face and scalp Treatment: Start Zoryve foam once daily to affected areas on scalp and face.  SCAR, possibly Hx of cysts Exam: Dyspigmented smooth macule or patch. Benign-appearing.  Observation.  Call clinic for new or changing lesions. Recommend daily broad spectrum sunscreen SPF 30+, reapply every 2 hours as needed. Treatment: Recommend Serica moisturizing scar formula cream every night or [facility] brand or Mederma silicone scar sheet every night for the first year after a scar appears to help with scar remodeling if desired. Scars remodel on their own for a full year and will gradually improve in appearance over time.  Return for Psoriasis Follow Up 3-4 months.  I, Lawson Radar, CMA, am acting as scribe for Armida Sans, MD.   Documentation: I have reviewed the above documentation for accuracy and completeness, and I agree with the above.  Armida Sans, MD

## 2023-12-30 ENCOUNTER — Ambulatory Visit (INDEPENDENT_AMBULATORY_CARE_PROVIDER_SITE_OTHER): Admitting: Certified Nurse Midwife

## 2023-12-30 VITALS — BP 122/83 | HR 88 | Ht 63.0 in | Wt 145.4 lb

## 2023-12-30 DIAGNOSIS — Z3169 Encounter for other general counseling and advice on procreation: Secondary | ICD-10-CM

## 2023-12-30 MED ORDER — LETROZOLE 2.5 MG PO TABS
2.5000 mg | ORAL_TABLET | Freq: Every day | ORAL | 0 refills | Status: DC
Start: 2023-12-30 — End: 2024-01-29
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
## 2024-01-04 ENCOUNTER — Ambulatory Visit: Payer: Self-pay | Admitting: *Deleted

## 2024-01-04 NOTE — Telephone Encounter (Signed)
 Copied from CRM [id number]. Topic: Clinical - Red Word Triage >> Jan 04, 2024  1:48 PM Aletta Edouard wrote: Red Word that prompted transfer to Nurse Triage: patient is lightheaded and can't catch her breath she is not feeling well at all she had a really bad cough Reason for Disposition  [1] MILD difficulty breathing (e.g., minimal/no SOB at rest, SOB with walking, pulse <100) AND [2] NEW-onset or WORSE than normal  Answer Assessment - Initial Assessment Questions 1. RESPIRATORY STATUS: "Describe your breathing?" (e.g., wheezing, shortness of breath, unable to speak, severe coughing)      I'm having trouble breathing   For 3 weeks I have this cough.  2 days ago it started getting worse.    2. ONSET: "When did this breathing problem begin?"      3 weeks ago.   The cough is getting better.   I get lightheaded with any activity.    I can't take a full breath.    I've had pneumonia in Sept or Oct 2024.    The coughing is bad. 3. PATTERN "Does the difficult breathing come and go, or has it been constant since it started?"      This feels like RSV as when I had RSV.     4. SEVERITY: "How bad is your breathing?" (e.g., mild, moderate, severe)    - MILD: No SOB at rest, mild SOB with walking, speaks normally in sentences, can lie down, no retractions, pulse < 100.    - MODERATE: SOB at rest, SOB with minimal exertion and prefers to sit, cannot lie down flat, speaks in phrases, mild retractions, audible wheezing, pulse 100-120.    - SEVERE: Very SOB at rest, speaks in single words, struggling to breathe, sitting hunched forward, retractions, pulse > 120      Moderate 5. RECURRENT SYMPTOM: "Have you had difficulty breathing before?" If Yes, ask: "When was the last time?" and "What happened that time?"      Yes 6. CARDIAC HISTORY: "Do you have any history of heart disease?" (e.g., heart attack, angina, bypass surgery, angioplasty)      Not asked 7. LUNG HISTORY: "Do you have any history of lung disease?"  (e.g.,  pulmonary embolus, asthma, emphysema)     Had pneumonia in Sept or Oct 2024 8. CAUSE: "What do you think is causing the breathing problem?"      I think the pollen kick started it.   9. OTHER SYMPTOMS: "Do you have any other symptoms? (e.g., dizziness, runny nose, cough, chest pain, fever)     Lightheaded, weak and coughing  10. O2 SATURATION MONITOR:  "Do you use an oxygen saturation monitor (pulse oximeter) at home?" If Yes, ask: "What is your reading (oxygen level) today?" "What is your usual oxygen saturation reading?" (e.g., 95%)       N/A 11. PREGNANCY: "Is there any chance you are pregnant?" "When was your last menstrual period?"       No 12. TRAVEL: "Have you traveled out of the country in the last month?" (e.g., travel history, exposures)       N/A  Protocols used: Breathing Difficulty-A-AH  Chief Complaint: Shortness of breath, coughing and lightheaded.   Symptoms: above Frequency: For the last 3 weeks but worse over the last 2 days Pertinent Negatives: Patient denies fever Disposition: [] ED /[] Urgent Care (no appt availability in office) / [x] Appointment(In office/virtual)/ []  [place] [facility]/ [] Home Care/ [] Refused Recommended Disposition /[] [place] [facility]/ []  Follow-up with PCP  Additional Notes: Appt made for 01/05/2024 at 8:00.

## 2024-01-05 ENCOUNTER — Ambulatory Visit (INDEPENDENT_AMBULATORY_CARE_PROVIDER_SITE_OTHER)

## 2024-01-05 ENCOUNTER — Ambulatory Visit

## 2024-01-05 ENCOUNTER — Ambulatory Visit: Admitting: Family Medicine

## 2024-01-05 ENCOUNTER — Telehealth: Payer: Self-pay | Admitting: Internal Medicine

## 2024-01-05 VITALS — BP 113/79 | HR 83 | Temp 98.8°F | Ht 63.0 in | Wt 142.0 lb

## 2024-01-05 DIAGNOSIS — R0982 Postnasal drip: Secondary | ICD-10-CM | POA: Insufficient documentation

## 2024-01-05 DIAGNOSIS — R0602 Shortness of breath: Secondary | ICD-10-CM

## 2024-01-05 DIAGNOSIS — J029 Acute pharyngitis, unspecified: Secondary | ICD-10-CM | POA: Diagnosis not present

## 2024-01-05 DIAGNOSIS — R051 Acute cough: Secondary | ICD-10-CM | POA: Insufficient documentation

## 2024-01-05 LAB — COMPREHENSIVE METABOLIC PANEL WITH GFR
ALT: 13 U/L (ref 0–35)
AST: 15 U/L (ref 0–37)
Albumin: 4.7 g/dL (ref 3.5–5.2)
Alkaline Phosphatase: 40 U/L (ref 39–117)
BUN: 12 mg/dL (ref 6–23)
CO2: 27 meq/L (ref 19–32)
Calcium: 9.6 mg/dL (ref 8.4–10.5)
Chloride: 105 meq/L (ref 96–112)
Creatinine, Ser: 0.94 mg/dL (ref 0.40–1.20)
GFR: 81.39 mL/min (ref >60.00)
Glucose, Bld: 80 mg/dL (ref 70–99)
Potassium: 4.4 meq/L (ref 3.5–5.1)
Sodium: 138 meq/L (ref 135–145)
Total Bilirubin: 0.6 mg/dL (ref 0.2–1.2)
Total Protein: 7.3 g/dL (ref 6.0–8.3)

## 2024-01-05 LAB — CBC WITH DIFFERENTIAL/PLATELET
Basophils Absolute: 0 10*3/uL (ref 0.0–0.1)
Basophils Relative: 0.7 % (ref 0.0–3.0)
Eosinophils Absolute: 0.2 10*3/uL (ref 0.0–0.7)
Eosinophils Relative: 2.9 % (ref 0.0–5.0)
HCT: 41.3 % (ref 36.0–46.0)
Hemoglobin: 13.5 g/dL (ref 12.0–15.0)
Lymphocytes Relative: 40.4 % (ref 12.0–46.0)
Lymphs Abs: 2.5 10*3/uL (ref 0.7–4.0)
MCHC: 32.7 g/dL (ref 30.0–36.0)
MCV: 89.8 fl (ref 78.0–100.0)
Monocytes Absolute: 0.5 10*3/uL (ref 0.1–1.0)
Monocytes Relative: 9 % (ref 3.0–12.0)
Neutro Abs: 2.9 10*3/uL (ref 1.4–7.7)
Neutrophils Relative %: 47 % (ref 43.0–77.0)
Platelets: 344 10*3/uL (ref 150.0–400.0)
RBC: 4.59 Mil/uL (ref 3.87–5.11)
RDW: 14 % (ref 11.5–15.5)
WBC: 6.1 10*3/uL (ref 4.0–10.5)

## 2024-01-05 LAB — POCT RAPID STREP A (OFFICE): Rapid Strep A Screen: NEGATIVE

## 2024-01-05 LAB — TSH: TSH: 3.34 u[IU]/mL (ref 0.35–5.50)

## 2024-01-05 MED ORDER — FLUTICASONE PROPIONATE 50 MCG/ACT NA SUSP
2.0000 | Freq: Every day | NASAL | 0 refills | Status: DC
Start: 2024-01-05 — End: 2024-01-27

## 2024-01-05 MED ORDER — ALBUTEROL SULFATE HFA 108 (90 BASE) MCG/ACT IN AERS
2.0000 | INHALATION_SPRAY | Freq: Four times a day (QID) | RESPIRATORY_TRACT | 0 refills | Status: DC | PRN
Start: 2024-01-05 — End: 2024-04-28

## 2024-01-05 MED ORDER — BENZONATATE 200 MG PO CAPS
200.0000 mg | ORAL_CAPSULE | Freq: Three times a day (TID) | ORAL | 0 refills | Status: DC | PRN
Start: 2024-01-05 — End: 2024-01-27

## 2024-01-05 NOTE — Patient Instructions (Signed)
-   Use nasal Flonase 2 puffs in each nostril daily for 7 days and as needed after that. Use over the counter allergy medication like Zyrtec, allegra, Xyzol,, claritin (1 tab daily) to help with allergy.   - Use albuterol inhaler 2 puffs, every 6 hourly as needed for wheezing and cough.   - Take Tessalon one tab, every 8 hourly as needed for cough.   - If chest x-ray or strep comes back positive we will reach out to you with recommendations to start antibiotic.   - Use warm salt water gargle, 3-4 times a day as needed to help sooth throat.   - Recommend f/u with your PCP in 1-2 weeks or sooner if symptoms does not improve.

## 2024-01-05 NOTE — Assessment & Plan Note (Signed)
 Recommend r/o strep pharyngitis, if positive recommend treatment with Amoxicillin 500 mg, BID for 10 days. Salt water gargle for 3-4 times a day.

## 2024-01-05 NOTE — Assessment & Plan Note (Addendum)
 D/D includes bronchitis, asthma/reactive airway, pneumonia, anemia, thyroid disorder. Patient did have low Ferritin on 10/27/2022. I recommend getting CBC, CMP, TSH, chest x-ray, iron panel. Management pending result. Patient was counseled on getting urine pregnancy test before getting x-ray and counseled in detail on potential teratogenic risk if she is pregnant and gets x-ray. Patient verbalized understanding, declines pregnancy test as she is sure she is not pregnant.

## 2024-01-05 NOTE — Progress Notes (Signed)
 Acute Office Visit  Subjective:    Patient ID: Christina Ruiz, female    DOB: 06/01/1991, 33 y.o.   MRN: [id number]  Chief Complaint  Patient presents with   Cough   Shortness of Breath   Dizziness    Established patient to the clinic presenting for evaluation of following concerns:  Cough Chronicity: Cough, for about 3 weeks. Non-productive and constant in nature.  Associated symptoms include chest pain with deep breathing, sore throat, shortness of breath with exertion, right ear pain. She also reports of occasional postnasal drip. She also c/o feeling dizzy when going up from sitting, with cough.  Associated symptoms comments: When cough first started patient also has URI like symptoms including runny nose, headache, sore throat. Her kids had similar symptoms. Other symptoms resolved except for cough, frontal headache. Shortness of breath and pleuritic chest pain that started over this weekend. Patient reports taking deep breath worsens cough. She has also noted to have post-nasal drip occasionally. She has tried Tylenol, Ibuprofen, Nyquil without significant improvement in symptoms. Patient is trying to get pregnant. She is currently not taking Ritalin and Letrozole. She is a [employer].   Pertinent negatives include no chills, fever or hemoptysis.   Review of Systems  Constitutional:  Negative for chills and fever.  HENT:  Positive for ear pain (right ear) and sore throat.   Respiratory:  Positive for cough and shortness of breath. Negative for hemoptysis.   Cardiovascular:  Positive for chest pain.  Gastrointestinal:  Negative for heartburn.  Neurological:  Positive for dizziness.   As per HPI    Objective:    BP 113/79   Pulse 83   Temp 98.8 F (37.1 C) (Oral)   Ht 5\' 3"  (1.6 m)   Wt 142 lb (64.4 kg)   LMP 12/18/2023 (Exact Date)   SpO2 98%   BMI 25.15 kg/m    Physical Exam Constitutional:      Appearance: She is well-developed.  HENT:     Head:  Normocephalic and atraumatic.     Right Ear: Tympanic membrane is perforated. Tympanic membrane is not injected, erythematous or bulging.     Left Ear: Tympanic membrane is not injected, erythematous or bulging.     Nose:     Right Turbinates: Pale.     Left Turbinates: Pale.     Mouth/Throat:     Mouth: Mucous membranes are moist.     Pharynx: Uvula midline. Posterior oropharyngeal erythema (erythematous mucosa) present. No uvula swelling.     Tonsils: No tonsillar exudate. 2+ on the right. 2+ on the left.  Cardiovascular:     Rate and Rhythm: Normal rate and regular rhythm.     Heart sounds: No murmur heard. Pulmonary:     Effort: No tachypnea or accessory muscle usage.     Comments: Expiratory wheezing on b/l bases that clears up with deep breathing. Dry cough triggered with deep breathing. Otherwise no crackles, egophony.  Abdominal:     General: There is no distension.     Palpations: Abdomen is soft.     Tenderness: There is no guarding.  Musculoskeletal:     Cervical back: Normal range of motion and neck supple.     Right lower leg: No edema.     Left lower leg: No edema.  Lymphadenopathy:     Cervical: No cervical adenopathy.  Skin:    General: Skin is warm.  Neurological:     Mental Status: She is alert and  oriented to person, place, and time.  Psychiatric:        Mood and Affect: Mood normal.     Results for orders placed or performed in visit on 01/05/24  POCT rapid strep A  Result Value Ref Range   Rapid Strep A Screen Negative Negative       Assessment & Plan:  Acute cough Assessment & Plan: Defer to SOB plan as well. Recommend starting Benzonatate 200 mg, three times a day for 7 days, Albuterol inhaler 2 puffs q6 hourly prn for wheezing. I also recommend starting nasal Flonase 2 puffs in each nostril daily for one week then prn for postnasal drip. She can start taking OTC antihistamine like allegra, Claritin, zyrtec, Xyzol 1 tab daily to help with allergy.  Once patient's acute episode is resolved I recommend she f/u with her PCP for discussion regarding concern for asthma and need for PFT for diagnosis establishment.   Orders: -     DG Chest 2 View; Future -     Albuterol Sulfate HFA; Inhale 2 puffs into the lungs every 6 (six) hours as needed for wheezing or shortness of breath (use 2 puffs every 6 hourly as needed for wheezing, cough).  Dispense: 8 g; Refill: 0 -     Benzonatate; Take 1 capsule (200 mg total) by mouth 3 (three) times daily as needed for cough (take one tab, every eight hourly as needed for cough).  Dispense: 21 capsule; Refill: 0  Sore throat Assessment & Plan: Recommend r/o strep pharyngitis, if positive recommend treatment with Amoxicillin 500 mg, BID for 10 days. Salt water gargle for 3-4 times a day.   Orders: -     POCT rapid strep A  Shortness of breath Assessment & Plan: D/D includes bronchitis, asthma/reactive airway, pneumonia, anemia, thyroid disorder. Patient did have low Ferritin on 10/27/2022. I recommend getting CBC, CMP, TSH, chest x-ray, iron panel. Management pending result. Patient was counseled on getting urine pregnancy test before getting x-ray and counseled in detail on potential teratogenic risk if she is pregnant and gets x-ray. Patient verbalized understanding, declines pregnancy test as she is sure she is not pregnant.     Orders: -     DG Chest 2 View; Future -     CBC with Differential/Platelet -     Comprehensive metabolic panel with GFR -     TSH -     Iron, TIBC and Ferritin Panel  Post-nasal drainage Assessment & Plan: Nasal Flonase as discussed in plan for acute cough. Defer to plan for acute cough, sore throat and SOB.   Orders: -     Fluticasone Propionate; Place 2 sprays into both nostrils daily.  Dispense: 16 g; Refill: 0    Return in about 2 weeks (around 01/19/2024) for With PCP if symptoms persists..  Korrey Schleicher, MD

## 2024-01-05 NOTE — Assessment & Plan Note (Signed)
 Nasal Flonase as discussed in plan for acute cough. Defer to plan for acute cough, sore throat and SOB.

## 2024-01-05 NOTE — Telephone Encounter (Signed)
 Lm that Patient's 8am appt has been changed to 9am with Dr Casimir Cleaver today. Dr Sueanne Emerald is out of the office.

## 2024-01-05 NOTE — Assessment & Plan Note (Addendum)
 Defer to SOB plan as well. Recommend starting Benzonatate 200 mg, three times a day for 7 days, Albuterol inhaler 2 puffs q6 hourly prn for wheezing. I also recommend starting nasal Flonase 2 puffs in each nostril daily for one week then prn for postnasal drip. She can start taking OTC antihistamine like allegra, Claritin, zyrtec, Xyzol 1 tab daily to help with allergy. Once patient's acute episode is resolved I recommend she f/u with her PCP for discussion regarding concern for asthma and need for PFT for diagnosis establishment.

## 2024-01-06 ENCOUNTER — Other Ambulatory Visit: Payer: Self-pay

## 2024-01-06 DIAGNOSIS — R790 Abnormal level of blood mineral: Secondary | ICD-10-CM

## 2024-01-06 LAB — IRON,TIBC AND FERRITIN PANEL
%SAT: 24 % (ref 16–45)
Ferritin: 9 ng/mL — ABNORMAL LOW (ref 16–154)
Iron: 82 ug/dL (ref 40–190)
TIBC: 346 ug/dL (ref 250–450)

## 2024-01-06 MED ORDER — FERROUS SULFATE 325 (65 FE) MG PO TABS
325.0000 mg | ORAL_TABLET | Freq: Every day | ORAL | Status: DC
Start: 2024-01-06 — End: 2024-01-11

## 2024-01-06 NOTE — Progress Notes (Signed)
 1. Low ferritin (Primary) - ferrous sulfate tablet 325 mg  Christina Vessel, MD

## 2024-01-06 NOTE — Telephone Encounter (Signed)
 Noted.

## 2024-01-11 ENCOUNTER — Other Ambulatory Visit: Payer: Self-pay

## 2024-01-11 MED ORDER — IRON (FERROUS SULFATE) 325 (65 FE) MG PO TABS: 325.0000 mg | ORAL_TABLET | Freq: Every day | ORAL | 0 refills | Status: DC

## 2024-01-27 ENCOUNTER — Ambulatory Visit: Admitting: Internal Medicine

## 2024-01-27 ENCOUNTER — Encounter: Payer: Self-pay | Admitting: Internal Medicine

## 2024-01-27 VITALS — BP 98/64 | HR 80 | Ht 63.0 in | Wt 140.2 lb

## 2024-01-27 DIAGNOSIS — J411 Mucopurulent chronic bronchitis: Secondary | ICD-10-CM

## 2024-01-27 DIAGNOSIS — R0982 Postnasal drip: Secondary | ICD-10-CM

## 2024-01-27 DIAGNOSIS — D649 Anemia, unspecified: Secondary | ICD-10-CM | POA: Diagnosis not present

## 2024-01-27 DIAGNOSIS — J9801 Acute bronchospasm: Secondary | ICD-10-CM

## 2024-01-27 DIAGNOSIS — R790 Abnormal level of blood mineral: Secondary | ICD-10-CM | POA: Diagnosis not present

## 2024-01-27 DIAGNOSIS — F330 Major depressive disorder, recurrent, mild: Secondary | ICD-10-CM

## 2024-01-27 DIAGNOSIS — J989 Respiratory disorder, unspecified: Secondary | ICD-10-CM

## 2024-01-27 MED ORDER — ALBUTEROL SULFATE HFA 108 (90 BASE) MCG/ACT IN AERS
2.0000 | INHALATION_SPRAY | Freq: Four times a day (QID) | RESPIRATORY_TRACT | 0 refills | Status: DC | PRN
Start: 2024-01-27 — End: 2024-04-28

## 2024-01-27 MED ORDER — PREDNISONE 10 MG PO TABS: ORAL_TABLET | ORAL | 0 refills | Status: DC

## 2024-01-27 MED ORDER — FLUTICASONE PROPIONATE 50 MCG/ACT NA SUSP: 2.0000 | Freq: Every day | NASAL | 0 refills | Status: DC

## 2024-01-27 NOTE — Progress Notes (Signed)
 Subjective:  Patient ID: Christina Ruiz, female    DOB: Feb 01, 1991  Age: 33 y.o. MRN: [id number]  CC: The primary encounter diagnosis was Low ferritin. Diagnoses of Anemia, unspecified type, Bronchospasm, Post-nasal drainage, Respiratory illness, and Bronchitis, mucopurulent recurrent (HCC) were also pertinent to this visit.   HPI Christina Ruiz presents for  Chief Complaint  Patient presents with   Medical Management of Chronic Issues    Follow up on asthma and recheck iron  levels   Follow up on recent treatment for allergic rhinitis and bronchospasm with pharyngitis.  All symptoms resolved.  Has not used albuterol  or flonase  in days  Low ferritin :  has been taking iron  supplement  but inconsistently because it makes her nauseated .    Outpatient Medications Prior to Visit  Medication Sig Dispense Refill   acetaminophen  (TYLENOL ) 325 MG tablet Take 650 mg by mouth every 6 (six) hours as needed.     albuterol  (VENTOLIN  HFA) 108 (90 Base) MCG/ACT inhaler Inhale 2 puffs into the lungs every 6 (six) hours as needed for wheezing or shortness of breath (use 2 puffs every 6 hourly as needed for wheezing, cough). 8 g 0   Iron , Ferrous Sulfate , 325 (65 Fe) MG TABS Take 325 mg by mouth daily with breakfast. 90 tablet 0   Roflumilast , Antiseborrheic, (ZORYVE ) 0.3 % FOAM Apply 1 Application topically daily. Apply to affected areas on scalp and face daily for seborrheic dermatitis 60 g 3   methylphenidate  (RITALIN ) 20 MG tablet Take 1 tablet (20 mg total) by mouth 2 (two) times daily with breakfast and lunch. 60 tablet 0   methylphenidate  (RITALIN ) 20 MG tablet Take 1 tablet (20 mg total) by mouth 2 (two) times daily with breakfast and lunch. 60 tablet 0   methylphenidate  (RITALIN ) 20 MG tablet Take 1 tablet (20 mg total) by mouth 2 (two) times daily with breakfast and lunch for 60 doses. 60 tablet 0   albuterol  (VENTOLIN  HFA) 108 (90 Base) MCG/ACT inhaler Inhale 2 puffs into the lungs every 6 (six)  hours as needed for wheezing or shortness of breath. (Patient not taking: Reported on 01/27/2024) 8 g 0   benzonatate  (TESSALON ) 200 MG capsule Take 1 capsule (200 mg total) by mouth 3 (three) times daily as needed for cough (take one tab, every eight hourly as needed for cough). (Patient not taking: Reported on 01/27/2024) 21 capsule 0   brompheniramine-pseudoephedrine-DM 30-2-10 MG/5ML syrup Take 5-10 mLs by mouth every 6 (six) hours as needed. (Patient not taking: Reported on 01/27/2024) 120 mL 0   clobetasol  (TEMOVATE ) 0.05 % external solution Apply 1 Application topically 2 (two) times daily. To scalp (Patient not taking: Reported on 01/27/2024) 50 mL 5   doxycycline  (VIBRA -TABS) 100 MG tablet Take 1 tablet (100 mg total) by mouth 2 (two) times daily. (Patient not taking: Reported on 01/27/2024) 14 tablet 0   fluticasone  (FLONASE ) 50 MCG/ACT nasal spray Place 2 sprays into both nostrils daily. (Patient not taking: Reported on 01/27/2024) 16 g 0   letrozole  (FEMARA ) 2.5 MG tablet Take 1 tablet (2.5 mg total) by mouth daily. Take on day 3-7 of menstrual cycle (Patient not taking: Reported on 01/27/2024) 5 tablet 0   methylphenidate  (RITALIN ) 20 MG tablet Take 1 tablet (20 mg total) by mouth 2 (two) times daily. (Patient not taking: Reported on 01/27/2024) 60 tablet 0   methylPREDNISolone  (MEDROL  DOSEPAK) 4 MG TBPK tablet Take as directed. (Patient not taking: Reported on 01/27/2024) 21 each 0  omeprazole  (PRILOSEC) 40 MG capsule Take 1 capsule (40 mg total) by mouth daily. On an empty stomach (Patient not taking: Reported on 01/27/2024) 30 capsule 3   No facility-administered medications prior to visit.    Review of Systems;  Patient denies headache, fevers, malaise, unintentional weight loss, skin rash, eye pain, sinus congestion and sinus pain, sore throat, dysphagia,  hemoptysis , cough, dyspnea, wheezing, chest pain, palpitations, orthopnea, edema, abdominal pain, nausea, melena, diarrhea, constipation, flank  pain, dysuria, hematuria, urinary  Frequency, nocturia, numbness, tingling, seizures,  Focal weakness, Loss of consciousness,  Tremor, insomnia, depression, anxiety, and suicidal ideation.      Objective:  BP 98/64   Pulse 80   Ht 5\' 3"  (1.6 m)   Wt 140 lb 3.2 oz (63.6 kg)   LMP 12/18/2023 (Exact Date)   SpO2 98%   BMI 24.84 kg/m   BP Readings from Last 3 Encounters:  01/27/24 98/64  01/05/24 113/79  12/30/23 122/83    Wt Readings from Last 3 Encounters:  01/27/24 140 lb 3.2 oz (63.6 kg)  01/05/24 142 lb (64.4 kg)  12/30/23 145 lb 6.4 oz (66 kg)    Physical Exam Vitals reviewed.  Constitutional:      General: She is not in acute distress.    Appearance: Normal appearance. She is normal weight. She is not ill-appearing, toxic-appearing or diaphoretic.  HENT:     Head: Normocephalic.  Eyes:     General: No scleral icterus.       Right eye: No discharge.        Left eye: No discharge.     Conjunctiva/sclera: Conjunctivae normal.  Cardiovascular:     Rate and Rhythm: Normal rate and regular rhythm.     Heart sounds: Normal heart sounds.  Pulmonary:     Effort: Pulmonary effort is normal. No respiratory distress.     Breath sounds: Normal breath sounds.  Musculoskeletal:        General: Normal range of motion.  Skin:    General: Skin is warm and dry.  Neurological:     General: No focal deficit present.     Mental Status: She is alert and oriented to person, place, and time. Mental status is at baseline.  Psychiatric:        Mood and Affect: Mood normal.        Behavior: Behavior normal.        Thought Content: Thought content normal.        Judgment: Judgment normal.    No results found for: "HGBA1C"  Lab Results  Component Value Date   CREATININE 0.94 01/05/2024   CREATININE 0.79 10/27/2022   CREATININE 0.81 07/30/2022    Lab Results  Component Value Date   WBC 6.1 01/05/2024   HGB 13.5 01/05/2024   HCT 41.3 01/05/2024   PLT 344.0 01/05/2024    GLUCOSE 80 01/05/2024   CHOL 163 01/16/2021   TRIG 51 01/16/2021   HDL 64 01/16/2021   LDLCALC 88 01/16/2021   ALT 13 01/05/2024   AST 15 01/05/2024   NA 138 01/05/2024   K 4.4 01/05/2024   CL 105 01/05/2024   CREATININE 0.94 01/05/2024   BUN 12 01/05/2024   CO2 27 01/05/2024   TSH 3.34 01/05/2024   INR 1.1 07/30/2022    No results found.  Assessment & Plan:  .Low ferritin -     IBC + Ferritin; Future  Anemia, unspecified type -     CBC with Differential/Platelet; Future -  IBC + Ferritin; Future  Bronchospasm -     Pulmonary Function Test [facility] Only; Future -     Pulmonary Visit  Post-nasal drainage -     Fluticasone  Propionate; Place 2 sprays into both nostrils daily.  Dispense: 16 g; Refill: 0  Respiratory illness -     Albuterol  Sulfate HFA; Inhale 2 puffs into the lungs every 6 (six) hours as needed for wheezing or shortness of breath.  Dispense: 8 g; Refill: 0  Bronchitis, mucopurulent recurrent (HCC) Assessment & Plan: She has had recurrent bronchitis with each respiratoy infection.  Referring to Pulmonology and for PFTS to rule out asthma    Other orders -     predniSONE ; 6 tablets on Day 1 , then reduce by 1 tablet daily until gone  Dispense: 21 tablet; Refill: 0      Follow-up: Return in about 6 months (around 07/29/2024).   Thersia Flax, MD

## 2024-01-27 NOTE — Patient Instructions (Addendum)
 continue iron  for now if tolerated.  You can use generic colace for constipation  (docusate )  stool softener   Take YOUR iron  after biggest meal  to minimize nausea   For the allergies:  Resume flonase  daily and one of the following OTC antihistamines (generic zyrtec, which is cetirizine.  Allegra is available generically as fexofenadine  180 mg once daily. The claritin is also available generically as loratidine .) Avoid oral decongestants (Like allegra "D")  because they can elevate pulse and BP   I am prescribing a Prednisone  taper for 6 days ,  NO NSAIDS (IBUPROFEN , ALEVE) WHILE TAKING PREDNISONE .  TYLENOL  OK TO USE    Pulmonary function tests and pulmonology follow up  ordered  to rule out asthma   RETURN FOR LABS IN 4 WEEKS

## 2024-01-29 NOTE — Assessment & Plan Note (Addendum)
 She has had recurrent bronchitis with each respiratoy infection.  Referring to Pulmonology and for PFTS to rule out asthma

## 2024-01-29 NOTE — Assessment & Plan Note (Addendum)
 She is not suicidal .  Symptoms have improved without resuming prozac.

## 2024-02-05 ENCOUNTER — Ambulatory Visit: Admitting: Internal Medicine

## 2024-02-12 ENCOUNTER — Other Ambulatory Visit: Payer: Self-pay | Admitting: Obstetrics

## 2024-02-12 MED ORDER — LETROZOLE 2.5 MG PO TABS: 2.5000 mg | ORAL_TABLET | Freq: Every day | ORAL | 0 refills | Status: DC

## 2024-02-22 ENCOUNTER — Ambulatory Visit: Admitting: Internal Medicine

## 2024-02-24 ENCOUNTER — Other Ambulatory Visit

## 2024-02-25 ENCOUNTER — Other Ambulatory Visit (INDEPENDENT_AMBULATORY_CARE_PROVIDER_SITE_OTHER)

## 2024-02-25 DIAGNOSIS — D649 Anemia, unspecified: Secondary | ICD-10-CM

## 2024-02-25 DIAGNOSIS — R790 Abnormal level of blood mineral: Secondary | ICD-10-CM

## 2024-02-26 LAB — CBC WITH DIFFERENTIAL/PLATELET
Basophils Absolute: 0.1 10*3/uL (ref 0.0–0.1)
Basophils Relative: 1 % (ref 0.0–3.0)
Eosinophils Absolute: 0.1 10*3/uL (ref 0.0–0.7)
Eosinophils Relative: 1.7 % (ref 0.0–5.0)
HCT: 40 % (ref 36.0–46.0)
Hemoglobin: 13.3 g/dL (ref 12.0–15.0)
Lymphocytes Relative: 33.2 % (ref 12.0–46.0)
Lymphs Abs: 1.8 10*3/uL (ref 0.7–4.0)
MCHC: 33.3 g/dL (ref 30.0–36.0)
MCV: 87.8 fl (ref 78.0–100.0)
Monocytes Absolute: 0.4 10*3/uL (ref 0.1–1.0)
Monocytes Relative: 8.2 % (ref 3.0–12.0)
Neutro Abs: 3 10*3/uL (ref 1.4–7.7)
Neutrophils Relative %: 55.9 % (ref 43.0–77.0)
Platelets: 333 10*3/uL (ref 150.0–400.0)
RBC: 4.56 Mil/uL (ref 3.87–5.11)
RDW: 13.6 % (ref 11.5–15.5)
WBC: 5.3 10*3/uL (ref 4.0–10.5)

## 2024-03-01 ENCOUNTER — Ambulatory Visit: Admitting: Dermatology

## 2024-03-03 LAB — IBC + FERRITIN
Ferritin: 26.1 ng/mL (ref 10.0–291.0)
Iron: 76 ug/dL (ref 42–145)
Saturation Ratios: 22.9 % (ref 20.0–50.0)
TIBC: 331.8 ug/dL (ref 250.0–450.0)
Transferrin: 237 mg/dL (ref 212.0–360.0)

## 2024-03-05 ENCOUNTER — Encounter: Payer: Self-pay | Admitting: Internal Medicine

## 2024-03-05 ENCOUNTER — Ambulatory Visit: Payer: Self-pay | Admitting: Internal Medicine

## 2024-03-05 DIAGNOSIS — D509 Iron deficiency anemia, unspecified: Secondary | ICD-10-CM | POA: Insufficient documentation

## 2024-03-15 ENCOUNTER — Encounter: Payer: Self-pay | Admitting: Certified Nurse Midwife

## 2024-03-15 ENCOUNTER — Other Ambulatory Visit: Payer: Self-pay

## 2024-03-15 DIAGNOSIS — Z3169 Encounter for other general counseling and advice on procreation: Secondary | ICD-10-CM

## 2024-03-15 MED ORDER — LETROZOLE 2.5 MG PO TABS
2.5000 mg | ORAL_TABLET | Freq: Every day | ORAL | 0 refills | Status: DC
Start: 2024-03-15 — End: 2024-03-17

## 2024-03-17 ENCOUNTER — Encounter: Payer: Self-pay | Admitting: Certified Nurse Midwife

## 2024-03-17 ENCOUNTER — Other Ambulatory Visit: Payer: Self-pay | Admitting: Certified Nurse Midwife

## 2024-03-17 MED ORDER — LETROZOLE 2.5 MG PO TABS: 5.0000 mg | ORAL_TABLET | Freq: Every day | ORAL | 1 refills | Status: DC

## 2024-04-13 ENCOUNTER — Ambulatory Visit: Admitting: Dermatology

## 2024-04-13 ENCOUNTER — Encounter: Payer: Self-pay | Admitting: Dermatology

## 2024-04-13 DIAGNOSIS — L409 Psoriasis, unspecified: Secondary | ICD-10-CM | POA: Diagnosis not present

## 2024-04-13 DIAGNOSIS — L219 Seborrheic dermatitis, unspecified: Secondary | ICD-10-CM | POA: Diagnosis not present

## 2024-04-13 DIAGNOSIS — Z79899 Other long term (current) drug therapy: Secondary | ICD-10-CM

## 2024-04-13 DIAGNOSIS — Z7189 Other specified counseling: Secondary | ICD-10-CM

## 2024-04-13 MED ORDER — CLOBETASOL PROPIONATE 0.05 % EX SHAM: MEDICATED_SHAMPOO | CUTANEOUS | 3 refills | Status: DC

## 2024-04-13 MED ORDER — ZORYVE 0.3 % EX FOAM: 1.0000 | Freq: Every day | CUTANEOUS | 3 refills | Status: DC

## 2024-04-13 NOTE — Progress Notes (Signed)
   Follow-Up Visit   Subjective  Christina Ruiz is a 33 y.o. 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 Has been using Zoryve  foam on face and elbows, has not been using on scalp. States it is hard to get the foam on scalp. Face has improved, elbows have improved but still has active areas on elbows.  Was unable to get refill from [facility]. Would like to continue using since issues on face have resolved.  Washes hair every 3 days.   States she is supposed to be evaluated for rosacea today.   The following portions of the chart were reviewed this encounter and updated as appropriate: medications, allergies, medical history  Review of Systems:  No other skin or systemic complaints except as noted in HPI or Assessment and Plan.  Objective  Well appearing patient in no apparent distress; mood and affect are within normal limits.  Areas Examined: Scalp, face, elbows  Relevant exam findings are noted in the Assessment and Plan.        Assessment & Plan    PSORIASIS Well-demarcated erythematous plaques with silvery scale and some clearing at elbows.   3% BSA.  Chronic and persistent condition with duration or expected duration over one year. Condition is improving with treatment but not currently at goal. Patient denies joint pain  Treatment Plan: Continue Zoryve  foam twice daily to elbows for psoriasis.   Counseling on psoriasis and coordination of care  psoriasis is a chronic non-curable, but treatable genetic/hereditary disease that may have other systemic features affecting other organ systems such as joints (Psoriatic Arthritis). It is associated with an increased risk of inflammatory bowel disease, heart disease, non-alcoholic fatty liver disease, and depression.  Treatments include light and laser treatments; topical medications; and systemic medications including oral and injectables.  If not improving with topicals consider adding Otezla at next  visit.   Side effects of Otezla (apremilast) include diarrhea, nausea, headache, upper respiratory infection, depression, and weight decrease (5-10%). It should only be taken by pregnant women after a discussion regarding risks and benefits with their doctor. Goal is control of skin condition, not cure.  The use of Otezla requires long term medication management, including periodic office visits.  SEBORRHEIC DERMATITIS / SeboPsoriasis Exam: Pink patches with greasy scale at scalp, face clear today Chronic and persistent condition with duration or expected duration over one year. Condition is symptomatic/ bothersome to patient. Not currently at goal. Seborrheic Dermatitis is a chronic persistent rash characterized by pinkness and scaling most commonly of the mid face but also can occur on the scalp (dandruff), ears; mid chest, mid back and groin.  It tends to be exacerbated by stress and cooler weather.  People who have neurologic disease may experience new onset or exacerbation of existing seborrheic dermatitis.  The condition is not curable but treatable and can be controlled. Treatment Plan: Start Clobetasol  shampoo  2-3 times per week lather on scalp, leave on 15 minutes, rinse out, for seborrheic dermatitis  Continue Zoryve  foam every night to face at bedtime for seborrheic dermatitis  Return in about 4 months (around 08/14/2024) for Psoriasis Follow Up, Seborrheic Dermatitis Follow Up.  I, Kate Fought, CMA, am acting as scribe for Alm Rhyme, MD.   Documentation: I have reviewed the above documentation for accuracy and completeness, and I agree with the above.  Alm Rhyme, MD

## 2024-04-13 NOTE — Patient Instructions (Addendum)
 Continue Zoryve  foam twice daily to elbows for psoriasis.   Apply Zoryve  foam to scalp every other night for seborrheic dermatitis, wash out in morning.   Start Clobetasol  shampoo  2-3 times per week lather on scalp, leave on 15 minutes, rinse out, for seborrheic dermatitis  Continue Zoryve  foam every night to face at bedtime for seborrheic dermatitis   Topical steroids (such as triamcinolone, fluocinolone, fluocinonide, mometasone, clobetasol , halobetasol, betamethasone , hydrocortisone) can cause thinning and lightening of the skin if they are used for too long in the same area. Your physician has selected the right strength medicine for your problem and area affected on the body. Please use your medication only as directed by your physician to prevent side effects.    Side effects of Otezla (apremilast) include diarrhea, nausea, headache, upper respiratory infection, depression, and weight decrease (5-10%). It should only be taken by pregnant women after a discussion regarding risks and benefits with their doctor. Goal is control of skin condition, not cure.  The use of Otezla requires long term medication management, including periodic office visits.    Due to recent changes in healthcare laws, you may see results of your pathology and/or laboratory studies on MyChart before the doctors have had a chance to review them. We understand that in some cases there may be results that are confusing or concerning to you. Please understand that not all results are received at the same time and often the doctors may need to interpret multiple results in order to provide you with the best plan of care or course of treatment. Therefore, we ask that you please give us  2 business days to thoroughly review all your results before contacting the office for clarification. Should we see a critical lab result, you will be contacted sooner.   If You Need Anything After Your Visit  If you have any questions or  concerns for your doctor, please call our main line at [phone] and press option 4 to reach your doctor's medical assistant. If no one answers, please leave a voicemail as directed and we will return your call as soon as possible. Messages left after 4 pm will be answered the following business day.   You may also send us  a message via MyChart. We typically respond to MyChart messages within 1-2 business days.  For prescription refills, please ask your pharmacy to contact our office. Our fax number is [phone].  If you have an urgent issue when the clinic is closed that cannot wait until the next business day, you can page your doctor at the number below.    Please note that while we do our best to be available for urgent issues outside of office hours, we are not available 24/7.   If you have an urgent issue and are unable to reach us , you may choose to seek medical care at your doctor's office, retail clinic, urgent care center, or emergency room.  If you have a medical emergency, please immediately call 911 or go to the emergency department.  Pager Numbers  - Dr. Hester: [phone]  - Dr. Jackquline: [phone]  - Dr. Claudene: [phone]   In the event of inclement weather, please call our main line at [phone] for an update on the status of any delays or closures.  Dermatology Medication Tips: Please keep the boxes that topical medications come in in order to help keep track of the instructions about where and how to use these. Pharmacies typically print the medication instructions only on  the boxes and not directly on the medication tubes.   If your medication is too expensive, please contact our office at [phone] option 4 or send us  a message through MyChart.   We are unable to tell what your co-pay for medications will be in advance as this is different depending on your insurance coverage. However, we may be able to find a substitute medication at lower cost  or fill out paperwork to get insurance to cover a needed medication.   If a prior authorization is required to get your medication covered by your insurance company, please allow us  1-2 business days to complete this process.  Drug prices often vary depending on where the prescription is filled and some pharmacies may offer cheaper prices.  The website www.goodrx.com contains coupons for medications through different pharmacies. The prices here do not account for what the cost may be with help from insurance (it may be cheaper with your insurance), but the website can give you the price if you did not use any insurance.  - You can print the associated coupon and take it with your prescription to the pharmacy.  - You may also stop by our office during regular business hours and pick up a GoodRx coupon card.  - If you need your prescription sent electronically to a different pharmacy, notify our office through [facility] or by phone at [phone] option 4.     Si Usted Necesita Algo Despus de Su Visita  Tambin puede enviarnos un mensaje a travs de Clinical cytogeneticist. Por lo general respondemos a los mensajes de MyChart en el transcurso de 1 a 2 das hbiles.  Para renovar recetas, por favor pida a su farmacia que se ponga en contacto con nuestra oficina. Randi lakes de fax es [place] [phone].  Si tiene un asunto urgente cuando la clnica est cerrada y que no puede esperar hasta el siguiente da hbil, puede llamar/localizar a su doctor(a) al nmero que aparece a continuacin.   Por favor, tenga en cuenta que aunque hacemos todo lo posible para estar disponibles para asuntos urgentes fuera del horario de Organ, no estamos disponibles las 24 horas del da, los 7 [address]  [address] de la [place].   Si tiene un problema urgente y no puede comunicarse con nosotros, puede optar por buscar atencin mdica  en el consultorio de su doctor(a), en una clnica privada, en un centro de atencin urgente o en una  sala de emergencias.  Si tiene Engineer, drilling, por favor llame inmediatamente al 911 o vaya a la sala de emergencias.  Nmeros de bper  - Dr. Hester: [phone]  - Dra. Jackquline: [phone]  - Dr. Claudene: [phone]   En caso de inclemencias del tiempo, por favor llame a landry capes principal al [phone] para una actualizacin sobre el [place] de cualquier retraso o cierre.  Consejos para la medicacin en dermatologa: Por favor, guarde las cajas en las que vienen los medicamentos de uso tpico para ayudarle a seguir las instrucciones sobre dnde y cmo usarlos. Las farmacias generalmente imprimen las instrucciones del medicamento slo en las cajas y no directamente en los tubos del [place].   Si su medicamento es muy caro, por favor, pngase en contacto con landry rieger llamando al [phone] y presione la opcin 4 o envenos un mensaje a travs de Clinical cytogeneticist.   No podemos decirle cul ser su copago por los medicamentos por adelantado ya que esto es diferente dependiendo de la cobertura de su seguro. Sin embargo, es posible  que podamos encontrar un medicamento sustituto a Audiological scientist un formulario para que el seguro cubra el medicamento que se considera necesario.   Si se requiere una autorizacin previa para que su compaa de seguros malta su medicamento, por favor permtanos de 1 a 2 das hbiles para completar este proceso.  Los precios de los medicamentos varan con frecuencia dependiendo del Environmental consultant de dnde se surte la receta y alguna farmacias pueden ofrecer precios ms baratos.  El sitio web www.goodrx.com tiene cupones para medicamentos de Health and safety inspector. Los precios aqu no tienen en cuenta lo que podra costar con la ayuda del seguro (puede ser ms barato con su seguro), pero el sitio web puede darle el precio si no utiliz Tourist information centre manager.  - Puede imprimir el cupn correspondiente y llevarlo con su receta a la farmacia.  - Tambin puede  pasar por nuestra oficina durante el horario de atencin regular y Education officer, museum una tarjeta de cupones de GoodRx.  - Si necesita que su receta se enve electrnicamente a una farmacia diferente, informe a nuestra oficina a travs de MyChart de [place] o por telfono llamando al [phone] y presione la opcin 4.

## 2024-04-19 ENCOUNTER — Encounter: Payer: Self-pay | Admitting: Dermatology

## 2024-04-28 ENCOUNTER — Encounter: Payer: Self-pay | Admitting: Pulmonary Disease

## 2024-04-28 ENCOUNTER — Ambulatory Visit (INDEPENDENT_AMBULATORY_CARE_PROVIDER_SITE_OTHER): Admitting: Pulmonary Disease

## 2024-04-28 ENCOUNTER — Ambulatory Visit: Admitting: Pulmonary Disease

## 2024-04-28 VITALS — BP 96/60 | HR 78 | Temp 98.7°F | Ht 63.0 in | Wt 124.8 lb

## 2024-04-28 DIAGNOSIS — J45909 Unspecified asthma, uncomplicated: Secondary | ICD-10-CM | POA: Diagnosis not present

## 2024-04-28 DIAGNOSIS — K219 Gastro-esophageal reflux disease without esophagitis: Secondary | ICD-10-CM | POA: Diagnosis not present

## 2024-04-28 DIAGNOSIS — R0602 Shortness of breath: Secondary | ICD-10-CM

## 2024-04-28 LAB — NITRIC OXIDE: Nitric Oxide: 13

## 2024-04-28 MED ORDER — AIRSUPRA 90-80 MCG/ACT IN AERO: 2.0000 | INHALATION_SPRAY | RESPIRATORY_TRACT | 2 refills | Status: AC | PRN
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
## 2024-04-28 NOTE — Patient Instructions (Signed)
 VISIT SUMMARY:  Today, you were seen for evaluation of your breathing difficulties, particularly during exercise, exposure to cold, and emotional stress. You have a history of suspected asthma, and your symptoms include wheezing, coughing fits, and lightheadedness when speaking for long periods. You also reported frequent heartburn and occasional chest pain.  YOUR PLAN:  -COUGH VARIANT ASTHMA: Cough variant asthma is a type of asthma where the main symptom is a dry, non-productive cough. It can be triggered by exercise, cold air, and emotional stress. You will start using Airsupra , two puffs every four hours as needed, and we have ordered pulmonary function studies to further evaluate your condition.  -GASTROESOPHAGEAL REFLUX DISEASE (GERD): GERD is a condition where stomach acid frequently flows back into the tube connecting your mouth and stomach, causing heartburn and chest pain. This can sometimes worsen asthma symptoms. You will start a short course of anti-acid medication to see if it helps with your asthma symptoms.  INSTRUCTIONS:  Please follow up with the pulmonary function studies as ordered. If you notice any significant changes in your symptoms or have any concerns, contact our office. Continue to monitor your symptoms and use the prescribed medications as directed.

## 2024-05-06 ENCOUNTER — Encounter: Payer: Self-pay | Admitting: Pulmonary Disease

## 2024-05-06 MED ORDER — ESOMEPRAZOLE MAGNESIUM 40 MG PO CPDR
40.0000 mg | DELAYED_RELEASE_CAPSULE | Freq: Every day | ORAL | 1 refills | Status: DC
Start: 2024-05-06 — End: 2024-07-11

## 2024-07-04 ENCOUNTER — Ambulatory Visit: Admitting: Internal Medicine

## 2024-07-04 ENCOUNTER — Ambulatory Visit: Payer: Self-pay

## 2024-07-04 ENCOUNTER — Other Ambulatory Visit: Payer: Self-pay

## 2024-07-04 ENCOUNTER — Ambulatory Visit: Payer: Self-pay | Admitting: Internal Medicine

## 2024-07-04 ENCOUNTER — Other Ambulatory Visit
Admission: RE | Admit: 2024-07-04 | Discharge: 2024-07-04 | Disposition: A | Discharge status: Home / Self Care | Source: Ambulatory Visit | Attending: Internal Medicine | Admitting: Internal Medicine

## 2024-07-04 ENCOUNTER — Encounter: Payer: Self-pay | Admitting: Internal Medicine

## 2024-07-04 VITALS — BP 118/82 | HR 80 | Temp 98.2°F | Resp 16 | Ht 63.0 in | Wt 119.8 lb

## 2024-07-04 DIAGNOSIS — H66001 Acute suppurative otitis media without spontaneous rupture of ear drum, right ear: Secondary | ICD-10-CM | POA: Diagnosis present

## 2024-07-04 DIAGNOSIS — M542 Cervicalgia: Secondary | ICD-10-CM

## 2024-07-04 DIAGNOSIS — J029 Acute pharyngitis, unspecified: Secondary | ICD-10-CM | POA: Diagnosis present

## 2024-07-04 DIAGNOSIS — R051 Acute cough: Secondary | ICD-10-CM | POA: Insufficient documentation

## 2024-07-04 DIAGNOSIS — R509 Fever, unspecified: Secondary | ICD-10-CM

## 2024-07-04 DIAGNOSIS — R519 Headache, unspecified: Secondary | ICD-10-CM

## 2024-07-04 LAB — BASIC METABOLIC PANEL WITH GFR
Anion gap: 10 (ref 5–15)
BUN: 9 mg/dL (ref 6–20)
CO2: 23 mmol/L (ref 22–32)
Calcium: 8.8 mg/dL — ABNORMAL LOW (ref 8.9–10.3)
Chloride: 106 mmol/L (ref 98–111)
Creatinine, Ser: 0.75 mg/dL (ref 0.44–1.00)
GFR, Estimated: >60 mL/min (ref >60)
Glucose, Bld: 88 mg/dL (ref 70–99)
Potassium: 3.7 mmol/L (ref 3.5–5.1)
Sodium: 139 mmol/L (ref 135–145)

## 2024-07-04 LAB — CBC WITH DIFFERENTIAL/PLATELET
Abs Immature Granulocytes: 0.01 K/uL (ref 0.00–0.07)
Basophils Absolute: 0 K/uL (ref 0.0–0.1)
Basophils Relative: 1 %
Eosinophils Absolute: 0.1 K/uL (ref 0.0–0.5)
Eosinophils Relative: 2 %
HCT: 35.3 % — ABNORMAL LOW (ref 36.0–46.0)
Hemoglobin: 12 g/dL (ref 12.0–15.0)
Immature Granulocytes: 0 %
Lymphocytes Relative: 51 %
Lymphs Abs: 2.2 K/uL (ref 0.7–4.0)
MCH: 30.4 pg (ref 26.0–34.0)
MCHC: 34 g/dL (ref 30.0–36.0)
MCV: 89.4 fL (ref 80.0–100.0)
Monocytes Absolute: 0.3 K/uL (ref 0.1–1.0)
Monocytes Relative: 7 %
Neutro Abs: 1.6 K/uL — ABNORMAL LOW (ref 1.7–7.7)
Neutrophils Relative %: 39 %
Platelets: 256 K/uL (ref 150–400)
RBC: 3.95 MIL/uL (ref 3.87–5.11)
RDW: 12.5 % (ref 11.5–15.5)
WBC: 4.2 K/uL (ref 4.0–10.5)
nRBC: 0 % (ref 0.0–0.2)

## 2024-07-04 LAB — POCT RAPID STREP A (OFFICE): Rapid Strep A Screen: NEGATIVE

## 2024-07-04 MED ORDER — TIZANIDINE HCL 4 MG PO TABS: 4.0000 mg | ORAL_TABLET | Freq: Every evening | ORAL | 0 refills | Status: DC | PRN

## 2024-07-04 MED ORDER — KETOROLAC TROMETHAMINE 60 MG/2ML IM SOLN
60.0000 mg | Freq: Once | INTRAMUSCULAR | Status: AC
  Administered 2024-07-04: 60 mg via INTRAMUSCULAR

## 2024-07-04 MED ORDER — AMOXICILLIN-POT CLAVULANATE 875-125 MG PO TABS: 1.0000 | ORAL_TABLET | Freq: Two times a day (BID) | ORAL | 0 refills | Status: AC

## 2024-07-04 NOTE — Progress Notes (Signed)
 Acute Office Visit  Subjective:     Patient ID: Christina Ruiz, female    DOB: [birth year], 33 y.o.   MRN: [id number]  Chief Complaint  Patient presents with   Otalgia    Seen at UC , still painful, fullness   Cough    Sore throat, headache, stiff neck  for 3 weeks    HPI Patient is in today for ear pain and cough. This is my first time meeting her .  Discussed the use of AI scribe software for clinical note transcription with the patient, who gave verbal consent to proceed.  History of Present Illness Christina Ruiz is a 33 year old female with history of asthma and arachnoiditis who presents with persistent headache, cough, and ear infection.  Symptoms began three weeks ago with a cough, initially thought to be due to allergies, but have persisted and worsened. She experiences a burning throat, persistent cough, and fatigue. Fatigue previously caused shortness of breath when climbing stairs, which has since resolved.  She has a persistent headache unresponsive to Tylenol  or ibuprofen , with worsening neck pain. The headache feels like eye pressure when lying down and is painful when sitting up. She has a history of meningitis and arachnoiditis, diagnosed in 2022 after complications from a spinal leak and blood patch post-childbirth.  Asthma flares up during illness, contributing to shortness of breath. She has a history of ear infections, particularly in the right ear, with a past eardrum rupture. Last week, she was diagnosed with an ear infection at urgent care and prescribed amoxicillin , which she completes today. Despite treatment, she experiences muffled and full sensations in her right ear.  She had fevers last week, with a peak temperature of 100.68F, which resolved spontaneously. She tested negative for COVID-19 and has not been tested for strep throat. Her first meningitis episode began with a strep infection. She has no known drug allergies.      Review of Systems   Constitutional:  Positive for fever and malaise/fatigue.  HENT:  Positive for ear pain and sore throat. Negative for congestion.   Respiratory:  Positive for cough. Negative for sputum production, shortness of breath and wheezing.   Cardiovascular:  Negative for chest pain.  Musculoskeletal:  Positive for neck pain.  Neurological:  Positive for headaches.        Objective:    BP 118/82 (Cuff Size: Large)   Pulse 80   Temp 98.2 F (36.8 C) (Oral)   Resp 16   Ht 5' 3 (1.6 m)   Wt 119 lb 12.8 oz (54.3 kg)   SpO2 99%   BMI 21.22 kg/m  BP Readings from Last 3 Encounters:  07/04/24 118/82  04/28/24 96/60  01/27/24 98/64   Wt Readings from Last 3 Encounters:  07/04/24 119 lb 12.8 oz (54.3 kg)  04/28/24 124 lb 12.8 oz (56.6 kg)  01/27/24 140 lb 3.2 oz (63.6 kg)      Physical Exam Constitutional:      Appearance: Normal appearance.  HENT:     Head: Normocephalic and atraumatic.     Right Ear: Ear canal and external ear normal. Tympanic membrane is bulging.     Left Ear: Tympanic membrane, ear canal and external ear normal.     Nose: Nose normal.     Mouth/Throat:     Mouth: Mucous membranes are moist.     Pharynx: Posterior oropharyngeal erythema present. No oropharyngeal exudate.  Eyes:     Conjunctiva/sclera: Conjunctivae normal.  Neck:  Comments: Brudzinski negative Cardiovascular:     Rate and Rhythm: Normal rate and regular rhythm.  Pulmonary:     Effort: Pulmonary effort is normal.     Breath sounds: Normal breath sounds. No wheezing, rhonchi or rales.  Musculoskeletal:     Cervical back: Tenderness present. No rigidity.  Skin:    General: Skin is warm and dry.  Neurological:     General: No focal deficit present.     Mental Status: She is alert. Mental status is at baseline.  Psychiatric:        Mood and Affect: Mood normal.        Behavior: Behavior normal.     No results found for any visits on 07/04/24.      Assessment & Plan:    Assessment & Plan Prolonged upper respiratory symptoms with acute right otitis media Persistent upper respiratory symptoms with incomplete resolution of right ear infection, possibly related to previous perforation scar. - Complete current course of amoxicillin . - Prescribe Augmentin  for stronger antibiotic coverage, noting potential for diarrhea. - Perform throat swab to test for strep infection, rapid strep negative.   Headache and neck pain under evaluation for possible meningitis Severe headache and neck pain with history of recurrent meningitis. No acute signs of meningitis, but history warrants further investigation. Differential includes viral illness, bacterial infection, or meningitis. Discussed need for prompt hospital evaluation if symptoms worsen. - Order stat blood work to evaluate for infection. - Administer Toradol  injection for anti-inflammatory effect. - Prescribe muscle relaxers for potential musculoskeletal pain. - Advise her to go to the hospital lab for stat blood tests. - Instruct her to monitor symptoms and go to the emergency room if symptoms worsen.  History of arachnoiditis and recurrent meningitis Arachnoiditis diagnosed in 2022 with previous meningitis episodes linked to spinal procedures and strep infection. Current symptoms raise concern for potential meningitis recurrence. - Ensure up-to-date vaccinations to prevent meningitis recurrence.   - tiZANidine (ZANAFLEX) 4 MG tablet; Take 1 tablet (4 mg total) by mouth at bedtime as needed for muscle spasms.  Dispense: 30 tablet; Refill: 0 - ketorolac  (TORADOL ) injection 60 mg - POCT rapid strep A - Culture, Group A Strep - CBC w/Diff/Platelet; Future - Basic Metabolic Panel (BMET); Future - amoxicillin -clavulanate (AUGMENTIN ) 875-125 MG tablet; Take 1 tablet by mouth 2 (two) times daily for 7 days.  Dispense: 14 tablet; Refill: 0   Return if symptoms worsen or fail to improve.  Christina Fischer, DO

## 2024-07-04 NOTE — Telephone Encounter (Signed)
 FYI Only or Action Required?: FYI only for provider.  Patient was last seen in primary care on 01/27/2024 by Christina Verneita CROME, MD.  Called Nurse Triage reporting Otalgia.  Symptoms began several days ago.  Interventions attempted: Prescription medications: amoxicillin .  Symptoms are: unchanged.  Triage Disposition: See PCP When Office is Open (Within 3 Days)  Patient/caregiver understands and will follow disposition?: Yes  Copied from CRM [id number]. Topic: Clinical - Red Word Triage >> Jul 04, 2024 10:21 AM Ashley R wrote: Red Word that prompted transfer to Nurse Triage: worsening cough with asthma causing anxiety/difficulty breathing. Cough worsening over the last 2 days, ear infection seen at urgent care, but antibiotic not helping/growing pressure in the ear. Reason for Disposition  [1] Ear congestion lasts > 3 days AND [2] no improvement after using Care Advice  (Exception: Ear congestion is a chronic symptom.)  Answer Assessment - Initial Assessment Questions Pt declined 11 AM appt, scheduled appt 07/04/24, alternative provider. Advised UC/ED if symptoms worsen. 1. LOCATION: Which ear is involved?       Right ear 2. SENSATION: Describe how the ear feels. (e.g., stuffy, full, plugged).      Feel clogged 3. ONSET:  When did the ear symptoms start?       Week ago 4. PAIN: Do you also have an earache? If Yes, ask: How bad is it? (Scale 0-10; none, mild, moderate or severe)     Headache; 6/10 6. OTHER SYMPTOMS: Do you have any other symptoms? (e.g., ear drainage, hay fever symptoms such as sneezing or a clear nasal discharge; cold symptoms such as a cough or runny nose)   HA; tylenol ; does not help,  , stiff neck(able to touch chin chest, no change in HA, no rash), right ear(currently taking Amoxicillin ), cough, fatigue, sore throat(denies redness, pus) Denies fever, chills, n/v/, blurred vision, weakness/ numbness, faint  Protocols used: Ear - Congestion-A-AH

## 2024-07-04 NOTE — Patient Instructions (Signed)
 SABRA

## 2024-07-04 NOTE — Telephone Encounter (Signed)
 Pt was seen today by a different provider.

## 2024-07-05 ENCOUNTER — Ambulatory Visit: Payer: Self-pay

## 2024-07-05 ENCOUNTER — Ambulatory Visit: Admitting: Pulmonary Disease

## 2024-07-05 ENCOUNTER — Encounter

## 2024-07-05 NOTE — Telephone Encounter (Signed)
 Spoke with pt to let her know that if her symptoms are worsening then she needs to go to the ER per the provider that she saw yesterday. Pt gave a verbal understanding.

## 2024-07-05 NOTE — Telephone Encounter (Signed)
 LMTCB. Need to let pt know that Dr. Marylynn agrees that the pt needs to be evaluated at the ED if her symptoms are worsening.

## 2024-07-05 NOTE — Telephone Encounter (Signed)
 noted

## 2024-07-05 NOTE — Telephone Encounter (Unsigned)
 Copied from CRM [id number]. Topic: General - Other >> Jul 05, 2024  2:20 PM Christina Ruiz wrote: Reason for CRM: Patient is returning a call from [place]. The note was relayed and she has no further questions.

## 2024-07-05 NOTE — Telephone Encounter (Signed)
 FYI Only or Action Required?: Action required by provider: update on patient condition.  Patient was last seen in primary care on 07/04/2024 by Bernardo Fend, DO.  Called Nurse Triage reporting Otalgia.  Symptoms began yesterday.  Interventions attempted: Prescription medications: Augmentin , zanaflex.  Symptoms are: gradually worsening.  Triage Disposition: See Physician Within 24 Hours  Patient/caregiver understands and will follow disposition?: UnsureCopied from CRM #[id number]. Topic: Clinical - Red Word Triage >> Jul 05, 2024 12:09 PM Uyopbjy D wrote: Everything is swollen nose mouth ears and can't lay done Copied message she sent to her pcp-Last night I had to sleep completely sitting up, because when I lay down it felt like everything was swelling, my jaw hurt, my gums were swollen, the roof of my mouth was swollen, it felt like even my nostrils were swollen, my cheekbones were hurting, temple, and my eye socket hurt really bad. All of this was on top of my headache and neck pain. The muscle relaxer didn't help anything it feels like.is there anything we can do so that I can get real rest? Sitting up rest is brutalI   gave her the response from her pcp she then asked if she should go to ER I told her I can't advise and I would get her on with a nurse. Reason for Disposition  Earache  (Exceptions: Brief ear pain of lasting less than 60 minutes, or earache occurring during air travel.)  Answer Assessment - Initial Assessment Questions Pt took first dose of Augmentin  this morning. Pt is on second round of abx. Round one was amoxicillin . I feel like my neck is getting worse. I can't lie down. It's too painful. I feel like everything is swelling internally in my head. I just want to sleep but I can't lie down.  No appts with PCP today. Please  advise.     1. LOCATION: Which ear is involved?     Right  2. ONSET: When did the ear pain start?      Last week  3. SEVERITY: How  bad is the pain?  (Scale 1-10; mild, moderate or severe)     Sitting-5 lying 4. URI SYMPTOMS: Do you have a runny nose or cough?     Sore throat and cough 5. FEVER: Do you have a fever? If Yes, ask: What is your temperature, how was it measured, and when did it start?     denies 6. CAUSE: Have you been swimming recently?, How often do you use Q-TIPS?, Have you had any recent air travel or scuba diving?     na 7. OTHER SYMPTOMS: Do you have any other symptoms? (e.g., decreased hearing, dizziness, headache, stiff neck, vomiting)     Headache, neck pain, fatigue  Protocols used: Earache-A-AH

## 2024-07-06 LAB — CULTURE, GROUP A STREP
Micro Number: 17092394
SPECIMEN QUALITY:: ADEQUATE

## 2024-07-06 NOTE — ED Provider Notes (Signed)
 [facility] Emergency Department Provider Note   ED Clinical Impression   Final diagnoses:  Other migraine without status migrainosus, not intractable (Primary)    HPI, Physical Exam   HPI: July 06, 2024 8:17 PM  Christina Ruiz is a 33 y.o. female with past medical history of asthma and arachnoiditis presenting with headache. The patient reports feeling ill for the past month, beginning with sore throat, followed by cough, fatigue, and intermittent fevers, leading to a diagnosis of acute otitis media on 06/27/2024. She has not recorded a fever since she initially presented for this infection. She was initially treated with amoxicillin  and is currently midway through a course of Augmentin , but is endorsing continued sharp bilateral ear pain. She was also given a shot of Toradol  in a prior visit after her pain did not improve, which did not provide significant relief. Of note, she reports a history of recurrent ear infections from a ruptured eardrum many years ago.   Over the past several days, she has experienced worsening headache and neck stiffness, with difficulty turning her head. She describes facial swelling last night, which improved when she slept upright. She also experienced a presyncopal episode earlier today during a meeting at work with associated blurry vision. She typically has mild headaches in the morning that are usually resolved with Tylenol  or caffeine , however, her current headache has been minimally responsive to alternating Tylenol  and ibuprofen  and are significantly more intense. A trial of a muscle relaxant also provided no relief. She is not currently under care for her arachnoiditis. No history of brain or spinal cord surgeries. She denies nausea, vomiting, or chills.  Physical Exam:  Constitutional: Alert and oriented. No acute distress. HEENT: Normocephalic and atraumatic. Conjunctivae clear. No congestion. Moist mucous membranes.  CV: Rate as above,  regular rhythm. Normal skin turgor. Pulm: Normal respiratory effort. GI: Non-distended MSK: Moving extremities equally Neuro: Normal speech and language. Patient is moving all extremities equally, face is symmetric at rest and with speech. Skin: Skin is warm, dry and intact. No rash noted.  Vitals:   07/06/24 1647 07/06/24 1653 07/06/24 2225 07/07/24 0000  BP:  132/97 104/74 113/74  Pulse: 78 76 81 68  Resp:  16 18 16   Temp:  37.1 C (98.8 F) 36.6 C (97.9 F)   TempSrc:  Oral Oral   SpO2: 100% 100% 98% 100%    Medical Decision Making, ED Course   BP 113/74   Pulse 68   Temp 36.6 C (97.9 F) (Oral)   Resp 16   SpO2 100%   Initial Clinical Impression/DDX/Medical Decision Making  On initial assessment, Christina Ruiz is a 33 y.o. female with past medical history of asthma and arachnoiditis presenting with a constellation of symptoms over the past month in the setting of recurrent otitis media, including sore throat, headache, neck pain, and ear pain, with some reported facial swelling last night and a presyncopal episode with some blurry vision earlier today.   On exam, patient is well-appearing and in no acute distress. Vital signs are within normal limits. Physical exam notable for full range of motion of neck although slow to initiate movement.  Upper extremities and lower extremities 5/5 strength.  Sensation intact.  No facial asymmetry or sensory deficits appreciated on the face.    Differential diagnosis includes but is not limited to migraine, cervicogenic headache, subarachnoid hemorrhage, intracranial mass, meningitis, arachnoiditis.  Patient without focal neurologic deficits to suggest acute intracranial abnormality at this time.  Patient  without symptoms or vital signs suggestive of increased ICP.  Denies thunderclap, maximal intensity onset, trauma making subarachnoid or other intracranial hemorrhage unlikely.  Patient with history of meningitis although afebrile  and well-appearing today without leukocytosis and does not think this feels like the time she had meningitis.  Consider complications from arachnoiditis although symptoms more consistent with migraine at this time.  Will treat automatically with migraine cocktail and if symptoms persistent, we will discuss further imaging and potential need for lumbar puncture although do not think this is necessary at this time.  Workup and further updates/updates to plan as per ED Course below:  ED Course: ED Course as of 07/07/24 1408  Wed Jul 06, 2024  2129 WBC: 5.3  2341 Patient feeling better at this time after migraine cocktail and would like to go home.  Discussed this is reassuring that her symptoms have resolved.  Instructed her to follow-up with her PCP regarding her symptoms today.  Given strict return precautions.  2341  All questions answered prior to discharge.  Instructed to follow-up with PCP in 2 to 3 days regarding their symptoms today.  Given strict ED return precautions and discharged in stable condition.    Discussion of Management with other Physicians, QHP or Appropriate Source: If applicable, as documented in ED course above Independent Interpretation of Studies: If applicable, documented in ED course above. I have reviewed recent and relevant previous record, including: If applicable, inpatient/outpatient notes and prior studies, documented in Impression/MDM  Social Determinants that significantly affected care: N/A   ____________________________________________  The case was discussed with the attending physician, who is in agreement with the above assessment and plan.   Additional History Elements   Chief Complaint Chief Complaint  Patient presents with  . Headache New Onset or New Symptoms   Additional Historian(s): none available  Past Medical History[1]  Past Surgical History[2]  Allergies Patient has no known allergies.  Family History Family  History[3]  Social History Short Social History[4]   Radiology   No orders to display    Pertinent labs & imaging results that were available during my care of the patient were independently interpreted by me and considered in my medical decision making (see chart for details).  Portions of this record have been created using Scientist, clinical (histocompatibility and immunogenetics). Dictation errors have been sought, but may not have been identified and corrected.  Documentation assistance was provided by Venetia Kuba, Scribe, on July 06, 2024 at 8:21 PM for Charmaine Norton, DO  Scribe's Attestation: The NP/PA or resident/attending physician obtained and performed the history, physical exam and medical decision making elements that were entered into the chart by me.  Signed by Charmaine Norton, DO on July 07, 2024 at 2:08 PM.         [1] No past medical history on file. [2] Past Surgical History: Procedure Laterality Date  . DILATION AND CURETTAGE OF UTERUS N/A   [3] History reviewed. No pertinent family history. [4] Social History Tobacco Use  . Smoking status: Never  . Smokeless tobacco: Never   Norton Charmaine, DO Resident 07/07/24 [phone]

## 2024-07-07 NOTE — Telephone Encounter (Signed)
 Spoke to patient and she stated she went to ER on 10/15. Still having a headache. Encourage patient to follow up with her PCP

## 2024-07-08 ENCOUNTER — Telehealth: Payer: Self-pay

## 2024-07-08 ENCOUNTER — Other Ambulatory Visit: Payer: Self-pay

## 2024-07-08 ENCOUNTER — Emergency Department

## 2024-07-08 ENCOUNTER — Emergency Department
Admission: EM | Admit: 2024-07-08 | Discharge: 2024-07-08 | Disposition: A | Discharge status: Home / Self Care | Attending: Emergency Medicine | Admitting: Emergency Medicine

## 2024-07-08 DIAGNOSIS — R519 Headache, unspecified: Secondary | ICD-10-CM

## 2024-07-08 DIAGNOSIS — G44001 Cluster headache syndrome, unspecified, intractable: Secondary | ICD-10-CM | POA: Insufficient documentation

## 2024-07-08 LAB — CBC WITH DIFFERENTIAL/PLATELET
Abs Immature Granulocytes: 0.01 K/uL (ref 0.00–0.07)
Basophils Absolute: 0 K/uL (ref 0.0–0.1)
Basophils Relative: 1 %
Eosinophils Absolute: 0.1 K/uL (ref 0.0–0.5)
Eosinophils Relative: 1 %
HCT: 39.2 % (ref 36.0–46.0)
Hemoglobin: 13.1 g/dL (ref 12.0–15.0)
Immature Granulocytes: 0 %
Lymphocytes Relative: 48 %
Lymphs Abs: 2.4 K/uL (ref 0.7–4.0)
MCH: 30.1 pg (ref 26.0–34.0)
MCHC: 33.4 g/dL (ref 30.0–36.0)
MCV: 90.1 fL (ref 80.0–100.0)
Monocytes Absolute: 0.3 K/uL (ref 0.1–1.0)
Monocytes Relative: 6 %
Neutro Abs: 2.3 K/uL (ref 1.7–7.7)
Neutrophils Relative %: 44 %
Platelets: 318 K/uL (ref 150–400)
RBC: 4.35 MIL/uL (ref 3.87–5.11)
RDW: 12.4 % (ref 11.5–15.5)
WBC: 5.1 K/uL (ref 4.0–10.5)
nRBC: 0 % (ref 0.0–0.2)

## 2024-07-08 LAB — BASIC METABOLIC PANEL WITH GFR
Anion gap: 10 (ref 5–15)
BUN: 13 mg/dL (ref 6–20)
CO2: 23 mmol/L (ref 22–32)
Calcium: 9 mg/dL (ref 8.9–10.3)
Chloride: 105 mmol/L (ref 98–111)
Creatinine, Ser: 0.81 mg/dL (ref 0.44–1.00)
GFR, Estimated: >60 mL/min (ref >60)
Glucose, Bld: 108 mg/dL — ABNORMAL HIGH (ref 70–99)
Potassium: 3.7 mmol/L (ref 3.5–5.1)
Sodium: 138 mmol/L (ref 135–145)

## 2024-07-08 LAB — POC URINE PREG, ED: Preg Test, Ur: NEGATIVE

## 2024-07-08 MED ORDER — KETOROLAC TROMETHAMINE 10 MG PO TABS: 10.0000 mg | ORAL_TABLET | Freq: Three times a day (TID) | ORAL | 0 refills | Status: DC

## 2024-07-08 MED ORDER — SODIUM CHLORIDE 0.9 % IV BOLUS
1000.0000 mL | Freq: Once | INTRAVENOUS | Status: AC
  Administered 2024-07-08: 1000 mL via INTRAVENOUS

## 2024-07-08 MED ORDER — GADOBUTROL 1 MMOL/ML IV SOLN
5.0000 mL | Freq: Once | INTRAVENOUS | Status: AC | PRN
  Administered 2024-07-08: 5 mL via INTRAVENOUS

## 2024-07-08 MED ORDER — PROCHLORPERAZINE EDISYLATE 10 MG/2ML IJ SOLN
10.0000 mg | Freq: Once | INTRAMUSCULAR | Status: AC
  Administered 2024-07-08: 10 mg via INTRAVENOUS
  Filled 2024-07-08: qty 2

## 2024-07-08 MED ORDER — DIPHENHYDRAMINE HCL 50 MG/ML IJ SOLN
25.0000 mg | Freq: Once | INTRAMUSCULAR | Status: AC
  Administered 2024-07-08: 25 mg via INTRAVENOUS
  Filled 2024-07-08: qty 1

## 2024-07-08 MED ORDER — KETOROLAC TROMETHAMINE 30 MG/ML IJ SOLN
30.0000 mg | Freq: Once | INTRAMUSCULAR | Status: AC
  Administered 2024-07-08: 30 mg via INTRAVENOUS
  Filled 2024-07-08: qty 1

## 2024-07-08 MED ORDER — SUMATRIPTAN SUCCINATE 6 MG/0.5ML ~~LOC~~ SOLN
6.0000 mg | Freq: Once | SUBCUTANEOUS | Status: AC
  Administered 2024-07-08: 6 mg via SUBCUTANEOUS
  Filled 2024-07-08: qty 0.5

## 2024-07-08 MED ORDER — PROCHLORPERAZINE MALEATE 5 MG PO TABS: 5.0000 mg | ORAL_TABLET | Freq: Three times a day (TID) | ORAL | 0 refills | Status: DC | PRN

## 2024-07-08 NOTE — ED Notes (Signed)
 Patient transported to MRI

## 2024-07-08 NOTE — ED Notes (Signed)
 Pt verbalizes understanding of discharge instructions.

## 2024-07-08 NOTE — Telephone Encounter (Signed)
 Spoke with pt to let her know that per Dr. Marylynn if her head is that severe then she should return to the ED and have them do the CT scan. Pt gave a verbal understanding.

## 2024-07-08 NOTE — Telephone Encounter (Signed)
 Spoke with pt and rescheduled her appt to Monday with Dr. Tullo. Pt is wanting to know if the referral to neurology and the CT scan go ahead and be placed. Pt is wanting to know if she can have the CT done ASAP. Pt is still experiencing the migraine that has been constant.

## 2024-07-08 NOTE — Discharge Instructions (Addendum)
 Your exam, labs, CTs, and MRI are overall reassuring.  No signs of any acute intracranial process to explain your headache syndrome.  Symptoms may represent a cluster headache syndrome.  Take the prescription meds as directed.  You may take OTC Benadryl  and Tylenol  for additional headache pain relief.  Follow with primary provider for ongoing evaluation.  Follow-up with neurology as planned.

## 2024-07-08 NOTE — Telephone Encounter (Signed)
 Copied from CRM #[id number]. Topic: Clinical - Medical Advice >> Jul 08, 2024 10:51 AM Rea ORN wrote: Reason for CRM: pt called to advise that she has been to the ED and the chiropractor within the past few days for migraine. Pt was was advised by her chiropractor to request a referral to neuro and to request a CT scan. Pt stated the migraine is consistent. She was scheduled for soonest available appt on 10/23 but is asking if she can be seen sooner. She also is asking can the CT or referral be placed in the interim while waiting for her appt. Please call back to advise.

## 2024-07-08 NOTE — ED Notes (Signed)
 Informed patient a urine sample is ordered. States she doesn't have to urinate at this time.

## 2024-07-08 NOTE — ED Provider Notes (Signed)
 [facility] Emergency Department Provider Note     Event Date/Time   First MD Initiated Contact with Patient 07/08/24 2001     (approximate)   History   Headache   HPI  Christina Ruiz is a 33 y.o. female with a history of IBS, ADHD, major depressive disorder, psoriasis, and anemia, presents to the ED via EMS from home.  Patient with endorsed 3 weeks of intermittent headache pain as well as some intermittent neck pain.  Patient localizes the headache to the frontal region of the forehead, as well as behind the eyes.  She has been evaluated by her primary provider for the same complaint.  She has had multiple ED and PCP visits, noting limited benefit with headache migraine combinations including Toradol , Benadryl , Reglan , and dexamethasone .  She denies being discharged with any ongoing medications for symptom management.  By her report, headache is currently a 6 out of 10.  She would also reported persistent headache with unrelenting pain since onset some 3 weeks ago.  She endorses some paresthesias in her extremities, primarily the right UE/LE, described as tingling at this time.  She would also report that today she developed some generalized weakness to the right UE/LE, described as heaviness.  She denies any recent injury, trauma, or fall.  No facial droop, slurred speech, syncope, vision loss, tinnitus, vertigo, or paralysis reported.  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  Physical Exam   Triage Vital Signs: ED Triage Vitals [07/08/24 1756]  Encounter Vitals Group     BP 118/84     Girls Systolic BP Percentile      Girls Diastolic BP Percentile      Boys Systolic BP Percentile      Boys Diastolic BP Percentile      Pulse Rate 89     Resp 16     Temp 98.8 F (37.1 C)     Temp src      SpO2 100 %     Weight 119 lb 0.8 oz (54 kg)     Height 5' 3 (1.6 m)     Head Circumference      Peak Flow      Pain Score  6     Pain Loc      Pain Education      Exclude from Growth Chart     Most recent vital signs: Vitals:   07/08/24 2230 07/08/24 2335  BP: 113/75 109/80  Pulse: (!) 57 70  Resp:  16  Temp:  98.2 F (36.8 C)  SpO2: 97% 98%    General Awake, no distress. NAD A&O x 4 HEENT NCAT. PERRL. EOMI. No rhinorrhea. Mucous membranes are moist.  TMs intact bilaterally.  No Battle sign appreciated. CV:  Good peripheral perfusion. RRR RESP:  Normal effort. CTA ABD:  No distention.  MSK:  AROM of all extremities, with some decreased resistance testing 4 out of 5 to the RUE/LUE.  Normal composite fist bilaterally.  Transitions from supine to sit without difficulty. NEURO: Cranial nerves II to XII grossly intact.  Normal UE/LE DTRs bilaterally.  No pronator drift is appreciated, but patient is weak on the right when compared to the left with resistance and extension.  Overall 4/5 on the right compared to 5/5 strength on the left..  Normal finger-nose.  Slow alternating movements.  No cerebellar ataxia appreciated.   ED Results / Procedures / Treatments   Labs (all  labs ordered are listed, but only abnormal results are displayed) Labs Reviewed  BASIC METABOLIC PANEL WITH GFR - Abnormal; Notable for the following components:      Result Value   Glucose, Bld 108 (*)    All other components within normal limits  CBC WITH DIFFERENTIAL/PLATELET  POC URINE PREG, ED     EKG   RADIOLOGY  I personally viewed and evaluated these images as part of my medical decision making, as well as reviewing the written report by the radiologist.  ED Provider Interpretation: No acute intracranial findings  MR ANGIO NECK W WO CONTRAST Result Date: 07/08/2024 EXAM: MRA Neck with and without contrast 07/08/2024 10:18:47 PM TECHNIQUE: Multiplanar multisequence MRA of the neck was performed with and without the administration of 5 mL gadobutrol (GADAVIST) 1 MMOL/ML injection. 2D and 3D reformatted images are  provided for review. Stenosis of the internal carotid arteries is measured using NASCET criteria. COMPARISON: Prior study from 02/25/2016. CLINICAL HISTORY: Vertebral artery dissection suspected. Patient to ED [facility] from home for headache, neck pain for 3 weeks. Has been seen multiple times for same. Complains of tingling in extremities. FINDINGS: CAROTID ARTERIES: No dissection. No hemodynamically significant stenosis by NASCET criteria. VERTEBRAL ARTERIES: No dissection. No significant stenosis. 1.1 cm right thyroid  nodule. IMPRESSION: 1. Normal MRA of the neck. 2. 1.1 cm right thyroid  nodule. Further evaluation with dedicated nonemergent thyroid  ultrasound recommended. . Electronically signed by: Morene Hoard MD 07/08/2024 10:56 PM EDT RP Workstation: [id number]   MR Brain W and Wo Contrast Result Date: 07/08/2024 EXAM: MRI BRAIN WITH AND WITHOUT CONTRAST 07/08/2024 10:18:47 PM TECHNIQUE: Multiplanar multisequence MRI of the head/brain was performed with and without the administration of 5 mL gadobutrol (GADAVIST) 1 MMOL/ML injection 5 mL GADOBUTROL 1 MMOL/ML IV SOLN. COMPARISON: Comparison made with head CT from earlier the same day. CLINICAL HISTORY: Headache, increasing frequency or severity. pt to ED [facility] from home for h/a, neck pain x3 weeks. Has been seen multiple times for same. C/o tingling in extremities. FINDINGS: BRAIN AND VENTRICLES: No acute infarct. No acute intracranial hemorrhage. No mass effect or midline shift. No hydrocephalus. The sella is unremarkable. Normal flow voids. No mass or abnormal enhancement. ORBITS: No acute abnormality. SINUSES: Mild mucosal thickening about the ethmoid and maxillary sinuses. BONES AND SOFT TISSUES: Normal bone marrow signal and enhancement. No acute soft tissue abnormality. LUNGS/PLEURAL SPACES: Trace right mild pleural effusion noted, and opacification. IMPRESSION: 1. Normal brain MRI. No acute intracranial abnormality. Electronically signed by:  Morene Hoard MD 07/08/2024 10:49 PM EDT RP Workstation: [id number]   CT HEAD WO CONTRAST ( ) Result Date: 07/08/2024 EXAM: CT HEAD WITHOUT CONTRAST 07/08/2024 06:21:14 PM TECHNIQUE: CT of the head was performed without the administration of intravenous contrast. Automated exposure control, iterative reconstruction, and/or weight based adjustment of the mA/kV was utilized to reduce the radiation dose to as low as reasonably achievable. COMPARISON: None available. CLINICAL HISTORY: Severe headache. First nurse note: pt to ED [facility] from home for h/a, neck pain x3 weeks. Has been seen multiple times for same. C/o tingling in extremities. VSS with EMS. FINDINGS: BRAIN AND VENTRICLES: No acute hemorrhage. No evidence of acute infarct. No hydrocephalus. No extra-axial collection. No mass effect or midline shift. ORBITS: No acute abnormality. SINUSES: No acute abnormality. SOFT TISSUES AND SKULL: No acute soft tissue abnormality. No skull fracture. IMPRESSION: 1. Normal head CT. No acute intracranial abnormality. Electronically signed by: Morene Hoard MD 07/08/2024 06:29 PM EDT RP Workstation: [id number]  PROCEDURES:  Critical Care performed: No  Procedures   MEDICATIONS ORDERED IN ED: Medications  prochlorperazine  (COMPAZINE ) injection 10 mg (10 mg Intravenous Given 07/08/24 2100)  SUMAtriptan (IMITREX) injection 6 mg (6 mg Subcutaneous Given 07/08/24 2101)  sodium chloride  0.9 % bolus 1,000 mL (0 mLs Intravenous Stopped 07/08/24 2213)  diphenhydrAMINE  (BENADRYL ) injection 25 mg (25 mg Intravenous Given 07/08/24 2059)  gadobutrol (GADAVIST) 1 MMOL/ML injection 5 mL (5 mLs Intravenous Contrast Given 07/08/24 2157)  ketorolac  (TORADOL ) 30 MG/ML injection 30 mg (30 mg Intravenous Given 07/08/24 2336)     IMPRESSION / MDM / ASSESSMENT AND PLAN / ED COURSE  I reviewed the triage vital signs and the nursing notes.                              Differential diagnosis includes, but  is not limited to, intracranial hemorrhage, meningitis/encephalitis, previous head trauma, cavernous venous thrombosis, tension headache, temporal arteritis, migraine or migraine equivalent, idiopathic intracranial hypertension, and non-specific headache.   Patient's presentation is most consistent with acute complicated illness / injury requiring diagnostic workup.  Patient's diagnosis is consistent with acute intractable headache syndrome, concerning for possible cluster headache.  Patient presents to the ED via EMS from home, for persistent headache x 3 weeks.  Patient limited benefit from prior ED evaluation and medication administration.  No prescription medicines were provided for home use.  Patient presents endorsing some right-sided weakness and paresthesias.  CT imaging interpreted by me, shows no acute intracranial process.  Due to the patient's paresthesias to the left side, felt it was reasonable patient will be discharged home with prescriptions for further valuate her with MRI imaging.  MRI brain and neck with and without contrast, showed no acute intracranial or vessel processes, respectively.  Patient additionally is endorsing some improvement of her headache syndrome noting pain at about a 4 out of 10 at time of this interval evaluation.  Medications provided included IV fluid bolus, Compazine , and subcu Imitrex.  Her headache syndrome may be a cluster headache syndrome as she has responded to Imitrex which had not been tried previously.  Otherwise patient stable at this time for outpatient management.  She is scheduled to be followed by neurology for her headache syndrome.  She is agreeable to discharge at this time with medications including ketorolac  and Compazine .  Patient is to follow up with her PCP or neurology as suggested, as needed or otherwise directed. Patient is given ED precautions to return to the ED for any worsening or new symptoms.   FINAL CLINICAL IMPRESSION(S) / ED  DIAGNOSES   Final diagnoses:  Acute intractable headache, unspecified headache type  Intractable cluster headache syndrome, unspecified chronicity pattern     Rx / DC Orders   ED Discharge Orders          Ordered    ketorolac  (TORADOL ) 10 MG tablet  Every 8 hours       Note to Pharmacy: IV/IM DOSE GIVEN IN THE ED   07/08/24 2324    prochlorperazine  (COMPAZINE ) 5 MG tablet  Every 8 hours PRN        07/08/24 2324             Note:  This document was prepared using Dragon voice recognition software and may include unintentional dictation errors.    Loyd Candida LULLA Aldona, PA-C 07/08/24 2341    Waymond Lorelle Cummins, MD 07/09/24 [phone]

## 2024-07-08 NOTE — Telephone Encounter (Unsigned)
 Copied from CRM #[id number]. Topic: Clinical - Medical Advice >> Jul 08, 2024 10:51 AM Rea ORN wrote: Reason for CRM: pt called to advise that she has been to the ED and the chiropractor within the past few days for migraine. Pt was was advised by her chiropractor to request a referral to neuro and to request a CT scan. Pt stated the migraine is consistent. She was scheduled for soonest available appt on 10/23 but is asking if she can be seen sooner. She also is asking can the CT or referral be placed in the interim while waiting for her appt. Please call back to advise. >> Jul 08, 2024  2:47 PM Rea ORN wrote: Pt called back to ask if PCP is not able to order the CT scan, can she request the order from ED, UC or Dr. Sharyle Fischer at [facility] (ov 07/04/24) that saw her for her migraine.

## 2024-07-08 NOTE — ED Triage Notes (Signed)
 First nurse note: pt to ED [facility] from home for h/a, neck pain x3 weeks. Has been seen multiple times for same. C/o tingling in extremities. VSS with EMS

## 2024-07-11 ENCOUNTER — Encounter: Payer: Self-pay | Admitting: Internal Medicine

## 2024-07-11 ENCOUNTER — Ambulatory Visit: Admitting: Internal Medicine

## 2024-07-11 VITALS — BP 112/70 | HR 82 | Ht 63.0 in | Wt 115.6 lb

## 2024-07-11 DIAGNOSIS — E041 Nontoxic single thyroid nodule: Secondary | ICD-10-CM | POA: Diagnosis not present

## 2024-07-11 DIAGNOSIS — R519 Headache, unspecified: Secondary | ICD-10-CM | POA: Diagnosis not present

## 2024-07-11 MED ORDER — VERAPAMIL HCL ER 120 MG PO TBCR: 120.0000 mg | EXTENDED_RELEASE_TABLET | Freq: Every day | ORAL | 2 refills | Status: DC

## 2024-07-11 MED ORDER — PREDNISONE 10 MG PO TABS: ORAL_TABLET | ORAL | 0 refills | Status: DC

## 2024-07-11 NOTE — Patient Instructions (Signed)
 Stop the ketorolac    Start the prednisone  taper tomorrow   Start the verapamil tonight (treatment for cluster headache)  Continue the omeprazole  that dr tamea prescribed to protect your stomach from the ketorolac  (gastritis)  Daily use of a probiotic advised for 3 weeks.    Increase tizanidine (muscle relaxer) to 1.5 or 2 tablets as needed  Referring to Dr Maree , [facility]   I will order a thyroid  ultrasound

## 2024-07-11 NOTE — Progress Notes (Unsigned)
 Subjective:  Patient ID: Christina Ruiz, female    DOB: [birth year]  Age: 33 y.o. MRN: [id number]  CC: There were no encounter diagnoses.   HPI Marybel Alcott presents for  Chief Complaint  Patient presents with   Hospitalization Follow-up    ED follow up    33 yr old female with history  of  asthma (confirmed with pulmonary)   arachnoiditis in Feb 2023 following a D & C treated with IV abx and dilaudid for several months for chronic pain  presents for ER follow up .  Treated in ER on  Oct 15 and again on Oct 17 for persistent headache,/  cot 15 treated  for migraine,  labs normal.  Ha returned and   on Oct 17 underwent MR Angio neck and MR Brain w and w/o contrast ,  no dissections  masses or strokes.  Slight improvement with dose of imitrex given in ER.  Given compazine  5 mg q 8 hours  prn and  and ketorolac  10 mg  for 5 days .  The headache has not changed and is persistent . Also having jaw pain bilaterally .  Still taking  amoxicillin   for otitis prescribed 2 weeks ago,  with a second round (augmentin ) prescribed last week.   Has missed 4 days thus far due to headache starting last week  :  one day 2 weeks ago,  then last Thursday Friday and today .    Ccs: cluster ha lasting 3 weeks   HPI headache started during a URI with S/T and cough 3 weeks ago. Still having sinus pressure without rhinorrhea.  Using steroid nasal spray and albuterol .        Outpatient Medications Prior to Visit  Medication Sig Dispense Refill   acetaminophen  (TYLENOL ) 325 MG tablet Take 650 mg by mouth every 6 (six) hours as needed.     Albuterol -Budesonide (AIRSUPRA ) 90-80 MCG/ACT AERO Inhale 2 puffs into the lungs every 4 (four) hours as needed. 10.7 g 2   amoxicillin -clavulanate (AUGMENTIN ) 875-125 MG tablet Take 1 tablet by mouth 2 (two) times daily for 7 days. 14 tablet 0   Clobetasol  Propionate 0.05 % shampoo 2-3 times per week lather on scalp, leave on 15 minutes, rinse out, for seborrheic dermatitis  118 mL 3   fluticasone  (FLONASE ) 50 MCG/ACT nasal spray Place 2 sprays into both nostrils daily. 16 g 0   ketorolac  (TORADOL ) 10 MG tablet Take 1 tablet (10 mg total) by mouth every 8 (eight) hours. 15 tablet 0   prochlorperazine  (COMPAZINE ) 5 MG tablet Take 1 tablet (5 mg total) by mouth every 8 (eight) hours as needed for nausea or vomiting. 30 tablet 0   Roflumilast  (ZORYVE ) 0.3 % FOAM Apply 1 Application topically daily. Apply to affected areas on scalp every other night and to face every night, apply twice daily to elbows for seborrheic dermatitis 60 g 3   tiZANidine (ZANAFLEX) 4 MG tablet Take 1 tablet (4 mg total) by mouth at bedtime as needed for muscle spasms. 30 tablet 0   methylphenidate  (RITALIN ) 20 MG tablet Take 1 tablet (20 mg total) by mouth 2 (two) times daily with breakfast and lunch. (Patient not taking: Reported on 07/11/2024) 60 tablet 0   methylphenidate  (RITALIN ) 20 MG tablet Take 1 tablet (20 mg total) by mouth 2 (two) times daily with breakfast and lunch. (Patient not taking: Reported on 07/11/2024) 60 tablet 0   methylphenidate  (RITALIN ) 20 MG tablet Take 1 tablet (20 mg total)  by mouth 2 (two) times daily with breakfast and lunch for 60 doses. (Patient not taking: Reported on 07/11/2024) 60 tablet 0   esomeprazole  (NEXIUM ) 40 MG capsule Take 1 capsule (40 mg total) by mouth daily. (Patient not taking: Reported on 07/11/2024) 30 capsule 1   Iron , Ferrous Sulfate , 325 (65 Fe) MG TABS Take 325 mg by mouth daily with breakfast. (Patient not taking: Reported on 07/11/2024) 90 tablet 0   letrozole  (FEMARA ) 2.5 MG tablet Take 2 tablets (5 mg total) by mouth daily. Take 2 tablets daily on day 3, 4, or 5 of cycle. (Patient not taking: Reported on 07/11/2024) 10 tablet 1   No facility-administered medications prior to visit.    Review of Systems;  Patient denies headache, fevers, malaise, unintentional weight loss, skin rash, eye pain, sinus congestion and sinus pain, sore throat,  dysphagia,  hemoptysis , cough, dyspnea, wheezing, chest pain, palpitations, orthopnea, edema, abdominal pain, nausea, melena, diarrhea, constipation, flank pain, dysuria, hematuria, urinary  Frequency, nocturia, numbness, tingling, seizures,  Focal weakness, Loss of consciousness,  Tremor, insomnia, depression, anxiety, and suicidal ideation.      Objective:  BP 112/70   Pulse 82   Ht 5' 3 (1.6 m)   Wt 115 lb 9.6 oz (52.4 kg)   LMP 07/04/2024   SpO2 97%   BMI 20.48 kg/m   BP Readings from Last 3 Encounters:  07/11/24 112/70  07/08/24 109/80  07/04/24 118/82    Wt Readings from Last 3 Encounters:  07/11/24 115 lb 9.6 oz (52.4 kg)  07/08/24 119 lb 0.8 oz (54 kg)  07/04/24 119 lb 12.8 oz (54.3 kg)    Physical Exam  No results found for: HGBA1C  Lab Results  Component Value Date   CREATININE 0.81 07/08/2024   CREATININE 0.75 07/04/2024   CREATININE 0.94 01/05/2024    Lab Results  Component Value Date   WBC 5.1 07/08/2024   HGB 13.1 07/08/2024   HCT 39.2 07/08/2024   PLT 318 07/08/2024   GLUCOSE 108 (H) 07/08/2024   CHOL 163 01/16/2021   TRIG 51 01/16/2021   HDL 64 01/16/2021   LDLCALC 88 01/16/2021   ALT 13 01/05/2024   AST 15 01/05/2024   NA 138 07/08/2024   K 3.7 07/08/2024   CL 105 07/08/2024   CREATININE 0.81 07/08/2024   BUN 13 07/08/2024   CO2 23 07/08/2024   TSH 3.34 01/05/2024   INR 1.1 07/30/2022    MR ANGIO NECK W WO CONTRAST Result Date: 07/08/2024 EXAM: MRA Neck with and without contrast 07/08/2024 10:18:47 PM TECHNIQUE: Multiplanar multisequence MRA of the neck was performed with and without the administration of 5 mL gadobutrol (GADAVIST) 1 MMOL/ML injection. 2D and 3D reformatted images are provided for review. Stenosis of the internal carotid arteries is measured using NASCET criteria. COMPARISON: Prior study from 02/25/2016. CLINICAL HISTORY: Vertebral artery dissection suspected. Patient to ED [facility] from home for headache, neck pain for  3 weeks. Has been seen multiple times for same. Complains of tingling in extremities. FINDINGS: CAROTID ARTERIES: No dissection. No hemodynamically significant stenosis by NASCET criteria. VERTEBRAL ARTERIES: No dissection. No significant stenosis. 1.1 cm right thyroid  nodule. IMPRESSION: 1. Normal MRA of the neck. 2. 1.1 cm right thyroid  nodule. Further evaluation with dedicated nonemergent thyroid  ultrasound recommended. . Electronically signed by: Morene Hoard MD 07/08/2024 10:56 PM EDT RP Workstation: [id number]   MR Brain W and Wo Contrast Result Date: 07/08/2024 EXAM: MRI BRAIN WITH AND WITHOUT CONTRAST 07/08/2024 10:18:47 PM  TECHNIQUE: Multiplanar multisequence MRI of the head/brain was performed with and without the administration of 5 mL gadobutrol (GADAVIST) 1 MMOL/ML injection 5 mL GADOBUTROL 1 MMOL/ML IV SOLN. COMPARISON: Comparison made with head CT from earlier the same day. CLINICAL HISTORY: Headache, increasing frequency or severity. pt to ED [facility] from home for h/a, neck pain x3 weeks. Has been seen multiple times for same. C/o tingling in extremities. FINDINGS: BRAIN AND VENTRICLES: No acute infarct. No acute intracranial hemorrhage. No mass effect or midline shift. No hydrocephalus. The sella is unremarkable. Normal flow voids. No mass or abnormal enhancement. ORBITS: No acute abnormality. SINUSES: Mild mucosal thickening about the ethmoid and maxillary sinuses. BONES AND SOFT TISSUES: Normal bone marrow signal and enhancement. No acute soft tissue abnormality. LUNGS/PLEURAL SPACES: Trace right mild pleural effusion noted, and opacification. IMPRESSION: 1. Normal brain MRI. No acute intracranial abnormality. Electronically signed by: Morene Hoard MD 07/08/2024 10:49 PM EDT RP Workstation: [id number]   CT HEAD WO CONTRAST ( ) Result Date: 07/08/2024 EXAM: CT HEAD WITHOUT CONTRAST 07/08/2024 06:21:14 PM TECHNIQUE: CT of the head was performed without the administration of  intravenous contrast. Automated exposure control, iterative reconstruction, and/or weight based adjustment of the mA/kV was utilized to reduce the radiation dose to as low as reasonably achievable. COMPARISON: None available. CLINICAL HISTORY: Severe headache. First nurse note: pt to ED [facility] from home for h/a, neck pain x3 weeks. Has been seen multiple times for same. C/o tingling in extremities. VSS with EMS. FINDINGS: BRAIN AND VENTRICLES: No acute hemorrhage. No evidence of acute infarct. No hydrocephalus. No extra-axial collection. No mass effect or midline shift. ORBITS: No acute abnormality. SINUSES: No acute abnormality. SOFT TISSUES AND SKULL: No acute soft tissue abnormality. No skull fracture. IMPRESSION: 1. Normal head CT. No acute intracranial abnormality. Electronically signed by: Morene Hoard MD 07/08/2024 06:29 PM EDT RP Workstation: [id number]    Assessment & Plan:  .There are no diagnoses linked to this encounter.   I spent 34 minutes on the day of this face to face encounter reviewing patient's  most recent visit with cardiology,  nephrology,  and neurology,  prior relevant surgical and non surgical procedures, recent  labs and imaging studies, counseling on weight management,  reviewing the assessment and plan with patient, and post visit ordering and reviewing of  diagnostics and therapeutics with patient  .   Follow-up: No follow-ups on file.   Verneita LITTIE Kettering, MD

## 2024-07-12 DIAGNOSIS — R519 Headache, unspecified: Secondary | ICD-10-CM | POA: Insufficient documentation

## 2024-07-12 DIAGNOSIS — E041 Nontoxic single thyroid nodule: Secondary | ICD-10-CM | POA: Insufficient documentation

## 2024-07-12 NOTE — Assessment & Plan Note (Signed)
 ER records reviewed including imaging.  Symptoms suggestive of cluster headache  given recurrence daily for 3 weeks, .  Stopping toradol  and statrting  prednisone  taper and verapamil

## 2024-07-12 NOTE — Assessment & Plan Note (Signed)
 Incidental finding on  MRA neck.  Thyroid  ultrasound order

## 2024-07-14 ENCOUNTER — Inpatient Hospital Stay: Admitting: Nurse Practitioner

## 2024-07-18 ENCOUNTER — Encounter: Payer: Self-pay | Admitting: Internal Medicine

## 2024-07-18 ENCOUNTER — Telehealth (INDEPENDENT_AMBULATORY_CARE_PROVIDER_SITE_OTHER): Admitting: Internal Medicine

## 2024-07-18 ENCOUNTER — Ambulatory Visit
Admission: RE | Admit: 2024-07-18 | Discharge: 2024-07-18 | Disposition: A | Discharge status: Home / Self Care | Source: Ambulatory Visit | Attending: Internal Medicine | Admitting: Internal Medicine

## 2024-07-18 VITALS — Ht 63.0 in | Wt 115.6 lb

## 2024-07-18 DIAGNOSIS — R519 Headache, unspecified: Secondary | ICD-10-CM | POA: Diagnosis not present

## 2024-07-18 DIAGNOSIS — J329 Chronic sinusitis, unspecified: Secondary | ICD-10-CM

## 2024-07-18 DIAGNOSIS — E041 Nontoxic single thyroid nodule: Secondary | ICD-10-CM

## 2024-07-18 MED ORDER — PREDNISONE 10 MG PO TABS: ORAL_TABLET | ORAL | 0 refills | Status: DC

## 2024-07-18 MED ORDER — AMOXICILLIN-POT CLAVULANATE 875-125 MG PO TABS: 1.0000 | ORAL_TABLET | Freq: Two times a day (BID) | ORAL | 0 refills | Status: DC

## 2024-07-18 NOTE — Patient Instructions (Signed)
 We will check a TSH (or more ) depending on the results of the thyroid  ultrasound   Given the inflammation noted in the sinuses under your eyes and at the top of your nose,  I  am going to treat you with antibiotics and steroids for two weeks  .   Please avoid using ibuprofen  while taking prednisone .   You can take 3000 mg of acetominophen (tylenol ) every day safely  In divided doses (750 mg  every 6 hours  Or 1000 mg every 8 hours.)
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
## 2024-07-18 NOTE — Assessment & Plan Note (Signed)
 Involving the ethmoids and maxillary  infllammation seen on MRI .  Augmentin  x 14 days,  prednisone  prolonged taper x 12 days
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
## 2024-07-19 NOTE — Assessment & Plan Note (Signed)
 Awaiting ultrasound results.   Thyroid  serologies will be ordered based on results

## 2024-07-19 NOTE — Assessment & Plan Note (Signed)
 Has not resolved with prednisone  taper.  Given the findings on MRI Brain of ethmoid and maxillary sinus inflammation,  will treat for chronic sinusitis  empirically with augmentin  x 2 weeks and 12 day prednisoe taper.  Neurology referral was made but appointment is  3 months out

## 2024-07-20 NOTE — Telephone Encounter (Unsigned)
 Copied from CRM [id number]. Topic: Clinical - Lab/Test Results >> Jul 20, 2024 12:37 PM Nessti S wrote: Reason for CRM: pt called in about lab results. Call back number [phone]

## 2024-07-21 ENCOUNTER — Ambulatory Visit: Payer: Self-pay | Admitting: Internal Medicine

## 2024-07-21 NOTE — Addendum Note (Signed)
 Addended by: MARYLYNN VERNEITA CROME on: 07/21/2024 02:24 PM   Modules accepted: Orders

## 2024-07-21 NOTE — Telephone Encounter (Unsigned)
 Copied from CRM [id number]. Topic: Clinical - Lab/Test Results >> Jul 20, 2024 12:37 PM Nessti S wrote: Reason for CRM: pt called in about lab results. Call back number [phone] >> Jul 21, 2024 12:37 PM Mia F wrote: Pt is calling to get the US  results. She says the results she have read says a biopsy need to be done and she does not want to wait on that. She is asking for a call back asap for results and plan

## 2024-07-22 NOTE — Telephone Encounter (Signed)
 See my chart message

## 2024-07-29 ENCOUNTER — Ambulatory Visit: Admitting: Internal Medicine

## 2024-07-29 VITALS — BP 112/70 | HR 71 | Temp 98.3°F | Ht 63.0 in | Wt 114.6 lb

## 2024-07-29 DIAGNOSIS — R5383 Other fatigue: Secondary | ICD-10-CM

## 2024-07-29 DIAGNOSIS — D509 Iron deficiency anemia, unspecified: Secondary | ICD-10-CM | POA: Diagnosis not present

## 2024-07-29 DIAGNOSIS — E785 Hyperlipidemia, unspecified: Secondary | ICD-10-CM | POA: Diagnosis not present

## 2024-07-29 DIAGNOSIS — R519 Headache, unspecified: Secondary | ICD-10-CM

## 2024-07-29 DIAGNOSIS — R7301 Impaired fasting glucose: Secondary | ICD-10-CM | POA: Diagnosis not present

## 2024-07-29 DIAGNOSIS — F901 Attention-deficit hyperactivity disorder, predominantly hyperactive type: Secondary | ICD-10-CM

## 2024-07-29 LAB — CBC WITH DIFFERENTIAL/PLATELET
Basophils Absolute: 0 K/uL (ref 0.0–0.1)
Basophils Relative: 0.7 % (ref 0.0–3.0)
Eosinophils Absolute: 0.1 K/uL (ref 0.0–0.7)
Eosinophils Relative: 2.4 % (ref 0.0–5.0)
HCT: 36.1 % (ref 36.0–46.0)
Hemoglobin: 12.2 g/dL (ref 12.0–15.0)
Lymphocytes Relative: 35.7 % (ref 12.0–46.0)
Lymphs Abs: 1.8 K/uL (ref 0.7–4.0)
MCHC: 34 g/dL (ref 30.0–36.0)
MCV: 89 fl (ref 78.0–100.0)
Monocytes Absolute: 0.6 K/uL (ref 0.1–1.0)
Monocytes Relative: 11.8 % (ref 3.0–12.0)
Neutro Abs: 2.5 K/uL (ref 1.4–7.7)
Neutrophils Relative %: 49.4 % (ref 43.0–77.0)
Platelets: 259 K/uL (ref 150.0–400.0)
RBC: 4.05 Mil/uL (ref 3.87–5.11)
RDW: 13.3 % (ref 11.5–15.5)
WBC: 5 K/uL (ref 4.0–10.5)

## 2024-07-29 LAB — COMPREHENSIVE METABOLIC PANEL WITH GFR
ALT: 8 U/L (ref 0–35)
AST: 10 U/L (ref 0–37)
Albumin: 4.2 g/dL (ref 3.5–5.2)
Alkaline Phosphatase: 36 U/L — ABNORMAL LOW (ref 39–117)
BUN: 10 mg/dL (ref 6–23)
CO2: 28 meq/L (ref 19–32)
Calcium: 9 mg/dL (ref 8.4–10.5)
Chloride: 107 meq/L (ref 96–112)
Creatinine, Ser: 0.77 mg/dL (ref 0.40–1.20)
GFR: 102.99 mL/min (ref >60.00)
Glucose, Bld: 52 mg/dL — ABNORMAL LOW (ref 70–99)
Potassium: 4.4 meq/L (ref 3.5–5.1)
Sodium: 142 meq/L (ref 135–145)
Total Bilirubin: 0.8 mg/dL (ref 0.2–1.2)
Total Protein: 6.4 g/dL (ref 6.0–8.3)

## 2024-07-29 LAB — TSH: TSH: 1.65 u[IU]/mL (ref 0.35–5.50)

## 2024-07-29 MED ORDER — AMITRIPTYLINE HCL 25 MG PO TABS
25.0000 mg | ORAL_TABLET | Freq: Every day | ORAL | 1 refills | Status: AC
Start: 2024-07-29 — End: ?

## 2024-07-29 MED ORDER — ONDANSETRON HCL 4 MG PO TABS: 4.0000 mg | ORAL_TABLET | Freq: Three times a day (TID) | ORAL | 0 refills | Status: DC | PRN

## 2024-07-29 MED ORDER — METHYLPHENIDATE HCL ER 10 MG PO TBCR: 10.0000 mg | EXTENDED_RELEASE_TABLET | Freq: Every day | ORAL | 0 refills | Status: AC

## 2024-07-29 NOTE — Patient Instructions (Signed)
 Starting controlled release methylphidate at 10 mg dose.  TAKE IT IN THE MORNING  WE AN INCREASE THE DOSE AFTER ONE WEEK IF NEEDED    STARTING AMITRIPYTLINE AT BEDTIME FOR TENSION HEADACHE.  25 MG STARTING DOSE.   MAY INCREASE DOSE TO 50 MG AFTER TEN DAYS IF NEEDED

## 2024-07-30 NOTE — Progress Notes (Signed)
 Subjective:  Patient ID: Christina Ruiz, female    DOB: 08-31-1991  Age: 33 y.o. MRN: [id number]  CC: The primary encounter diagnosis was Other fatigue. Diagnoses of Iron  deficiency anemia, unspecified iron  deficiency anemia type, Dyslipidemia, Impaired fasting glucose, Attention deficit hyperactivity disorder (ADHD), predominantly hyperactive type, and Headache disorder were also pertinent to this visit.   HPI Christina Ruiz presents for  Chief Complaint  Patient presents with   Medical Management of Chronic Issues    Wants to discuss ADHD medication possibly starting again   1) decreased concentration:  Christina Ruiz has a history of decreased concentration documented [facility] evaluation in May 2024 treated  briefly  in 2025 with methylphenidate   who presents with request to resume therapy.   ADD evaluation by [facility] in May. No report in chart but per patient she was told :  No ADD. Yes to Sensory processing disorder. 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  She recalls that she often forgot to take the afternoon dose of medication until too late in the day to take it  2) Persistent headache:  she reports improvement but non relenting daily headache for over 8 weeks.     Outpatient Medications Prior to Visit  Medication Sig Dispense Refill   acetaminophen  (TYLENOL ) 325 MG tablet Take 650 mg by mouth every 6 (six) hours as needed.     Albuterol -Budesonide (AIRSUPRA ) 90-80 MCG/ACT AERO Inhale 2 puffs into the lungs every 4 (four) hours as needed. 10.7 g 2   Clobetasol  Propionate 0.05 % shampoo 2-3 times per week lather on scalp, leave on 15 minutes, rinse out, for seborrheic dermatitis 118 mL 3   fluticasone  (FLONASE ) 50 MCG/ACT nasal spray Place 2 sprays into both nostrils  daily. 16 g 0   prochlorperazine  (COMPAZINE ) 5 MG tablet Take 1 tablet (5 mg total) by mouth every 8 (eight) hours as needed for nausea or vomiting. 30 tablet 0   Roflumilast  (ZORYVE ) 0.3 % FOAM Apply 1 Application topically daily. Apply to affected areas on scalp every other night and to face every night, apply twice daily to elbows for seborrheic dermatitis 60 g 3   tiZANidine (ZANAFLEX) 4 MG tablet Take 1 tablet (4 mg total) by mouth at bedtime as needed for muscle spasms. 30 tablet 0   amoxicillin -clavulanate (AUGMENTIN ) 875-125 MG tablet Take 1 tablet by mouth 2 (two) times daily. With food (Patient not taking: Reported on 07/29/2024) 28 tablet 0   predniSONE  (DELTASONE ) 10 MG tablet 6 tablets on Day 1 , then reduce by 1 tablet daily until gone (Patient not taking: Reported on 07/29/2024) 21 tablet 0   predniSONE  (DELTASONE ) 10 MG tablet 6 tablets daily for 2 days, then reduce by  1 tablet every 2  days  until gone (12 days) (Patient not taking: Reported on 07/29/2024) 42 tablet 0   No facility-administered medications prior to visit.    Review of Systems;  Patient denies headache, fevers, malaise, unintentional weight loss, skin rash, eye pain, sinus congestion and sinus pain, sore throat, dysphagia,  hemoptysis , cough, dyspnea, wheezing, chest pain, palpitations, orthopnea, edema, abdominal pain, nausea, melena, diarrhea, constipation, flank pain, dysuria, hematuria, urinary  Frequency, nocturia, numbness, tingling, seizures,  Focal weakness, Loss of consciousness,  Tremor, insomnia, depression, anxiety, and suicidal ideation.  Objective:  BP 112/70   Pulse 71   Temp 98.3 F (36.8 C) (Oral)   Ht 5' 3 (1.6 m)   Wt 114 lb 9.6 oz (52 kg)   LMP 07/04/2024   SpO2 98%   BMI 20.30 kg/m   BP Readings from Last 3 Encounters:  07/29/24 112/70  07/11/24 112/70  07/08/24 109/80    Wt Readings from Last 3 Encounters:  07/29/24 114 lb 9.6 oz (52 kg)  07/18/24 115 lb 9.6 oz (52.4 kg)   07/11/24 115 lb 9.6 oz (52.4 kg)    Physical Exam  No results found for: HGBA1C  Lab Results  Component Value Date   CREATININE 0.77 07/29/2024   CREATININE 0.81 07/08/2024   CREATININE 0.75 07/04/2024    Lab Results  Component Value Date   WBC 5.0 07/29/2024   HGB 12.2 07/29/2024   HCT 36.1 07/29/2024   PLT 259.0 07/29/2024   GLUCOSE 52 (L) 07/29/2024   CHOL 163 01/16/2021   TRIG 51 01/16/2021   HDL 64 01/16/2021   LDLCALC 88 01/16/2021   ALT 8 07/29/2024   AST 10 07/29/2024   NA 142 07/29/2024   K 4.4 07/29/2024   CL 107 07/29/2024   CREATININE 0.77 07/29/2024   BUN 10 07/29/2024   CO2 28 07/29/2024   TSH 1.65 07/29/2024   INR 1.1 07/30/2022    US  THYROID  Result Date: 07/19/2024 CLINICAL DATA:  RIGHT thyroid  nodule seen on MRA of the neck EXAM: THYROID  ULTRASOUND TECHNIQUE: Ultrasound examination of the thyroid  gland and adjacent soft tissues was performed. COMPARISON:  MRA neck 07/08/2024 FINDINGS: Parenchymal Echotexture: Mildly heterogenous Isthmus: 0.1 cm Right lobe: 4.6 x 1.6 x 1.7 cm Left lobe: 4.0 x 1.2 x 1.5 cm _________________________________________________________ Estimated total number of nodules >/= 1 cm: 1 Number of spongiform nodules >/=  2 cm not described below (TR1): 0 Number of mixed cystic and solid nodules >/= 1.5 cm not described below (TR2): 0 _________________________________________________________ Nodule # 1: Location: RIGHT; inferior Maximum size: 1.8 cm; Other 2 dimensions: 1.4 x 1.5 cm Composition: solid/almost completely solid (2) Echogenicity: hypoechoic (2) Shape: not taller-than-wide (0) Margins: smooth (0) Echogenic foci: none (0) ACR TI-RADS total points: 4. ACR TI-RADS risk category: TR4 (4-6 points). ACR TI-RADS recommendations: **Given size (>/= 1.5 cm) and appearance, fine needle aspiration of this moderately suspicious nodule should be considered based on TI-RADS criteria. _________________________________________________________  IMPRESSION: Nodule 1 (TI-RADS 4), measuring 1.8 cm, located in the inferior RIGHT thyroid  lobe, meets criteria for FNA. The above is in keeping with the ACR TI-RADS recommendations - J Am Coll Radiol 2017;14:587-595. Electronically Signed   By: Aliene Lloyd M.D.   On: 07/19/2024 17:12    Assessment & Plan:  .Other fatigue -     TSH  Iron  deficiency anemia, unspecified iron  deficiency anemia type -     CBC with Differential/Platelet  Dyslipidemia  Impaired fasting glucose -     Comprehensive metabolic panel with GFR  Attention deficit hyperactivity disorder (ADHD), predominantly hyperactive type Assessment & Plan: Suggested  by formal testing done by [facility] in May 2024  and transiently improved with starting dose of ritalin .  Resuming with once daily methylphenidate     Headache disorder Assessment & Plan: Has not resolved with prednisone  taper, followed by empiric treatment for chronic sinusitis given MRI findings of ethmoid and maxillary sinus inflammation.  Starting amitriptline today.   Neurology referral was made but appointment is  3 months out  Other orders -     Methylphenidate  HCl ER; Take 1 tablet (10 mg total) by mouth daily.  Dispense: 30 tablet; Refill: 0 -     Amitriptyline  HCl; Take 1 tablet (25 mg total) by mouth at bedtime.  Dispense: 90 tablet; Refill: 1 -     Ondansetron  HCl; Take 1 tablet (4 mg total) by mouth every 8 (eight) hours as needed for nausea or vomiting.  Dispense: 20 tablet; Refill: 0    Follow-up: Return in about 6 months (around 01/26/2025).   Verneita LITTIE Kettering, MD

## 2024-07-30 NOTE — Assessment & Plan Note (Signed)
 Suggested  by formal testing done by [facility] in May 2024  and transiently improved with starting dose of ritalin .  Resuming with once daily methylphenidate 

## 2024-07-30 NOTE — Assessment & Plan Note (Signed)
 Has not resolved with prednisone  taper, followed by empiric treatment for chronic sinusitis given MRI findings of ethmoid and maxillary sinus inflammation.  Starting amitriptline today.   Neurology referral was made but appointment is  3 months out

## 2024-07-31 ENCOUNTER — Ambulatory Visit: Payer: Self-pay | Admitting: Internal Medicine

## 2024-08-03 ENCOUNTER — Other Ambulatory Visit: Payer: Self-pay | Admitting: Surgery

## 2024-08-03 DIAGNOSIS — E041 Nontoxic single thyroid nodule: Secondary | ICD-10-CM

## 2024-08-09 ENCOUNTER — Ambulatory Visit (INDEPENDENT_AMBULATORY_CARE_PROVIDER_SITE_OTHER): Admitting: Dermatology

## 2024-08-09 DIAGNOSIS — Z79899 Other long term (current) drug therapy: Secondary | ICD-10-CM

## 2024-08-09 DIAGNOSIS — L409 Psoriasis, unspecified: Secondary | ICD-10-CM | POA: Diagnosis not present

## 2024-08-09 DIAGNOSIS — Z7189 Other specified counseling: Secondary | ICD-10-CM | POA: Diagnosis not present

## 2024-08-09 DIAGNOSIS — L219 Seborrheic dermatitis, unspecified: Secondary | ICD-10-CM

## 2024-08-09 MED ORDER — ZORYVE 0.3 % EX FOAM
1.0000 | Freq: Every day | CUTANEOUS | 6 refills | Status: AC
Start: 2024-08-09 — End: ?

## 2024-08-09 NOTE — Progress Notes (Unsigned)
   Follow-Up Visit   Subjective  Christina Ruiz is a 33 y.o. female who presents for the following: 3 months f/u on Psoriasis on her elbows, treating with Zoryve  foam with a fair response, f/u on rash on her scalp treating with Clobetasol  shampoo and Zoryve  foam with a fair response.   The following portions of the chart were reviewed this encounter and updated as appropriate: medications, allergies, medical history  Review of Systems:  No other skin or systemic complaints except as noted in HPI or Assessment and Plan.  Objective  Well appearing patient in no apparent distress; mood and affect are within normal limits.  Areas Examined: Face,scalp,elbows  Relevant exam findings are noted in the Assessment and Plan.   Assessment & Plan     PSORIASIS Well-demarcated erythematous plaques with silvery scale and some clearing at elbows.   3% BSA. Chronic and persistent condition with duration or expected duration over one year. Condition is improving with treatment but not currently at goal. Patient denies joint pain Treatment Plan: Continue Zoryve  foam twice daily to elbows for psoriasis.    Counseling on psoriasis and coordination of care  psoriasis is a chronic non-curable, but treatable genetic/hereditary disease that may have other systemic features affecting other organ systems such as joints (Psoriatic Arthritis). It is associated with an increased risk of inflammatory bowel disease, heart disease, non-alcoholic fatty liver disease, and depression.  Treatments include light and laser treatments; topical medications; and systemic medications including oral and injectables. If not improving with topicals consider adding Otezla at next visit.  Patient decline starting Otezla today  Side effects of Otezla (apremilast) include diarrhea, nausea, headache, upper respiratory infection, depression, and weight decrease (5-10%). It should only be taken by pregnant women after a discussion  regarding risks and benefits with their doctor. Goal is control of skin condition, not cure.  The use of Otezla requires long term medication management, including periodic office visits. Long term medication management.  Patient is using long term (months to years) prescription medication  to control their dermatologic condition.  These medications require periodic monitoring to evaluate for efficacy and side effects and may require periodic laboratory monitoring.   SEBORRHEIC DERMATITIS / SeboPsoriasis Exam: Pink patches with greasy scale at scalp, face clear today Chronic and persistent condition with duration or expected duration over one year. Condition is symptomatic/ bothersome to patient. Not currently at goal. Seborrheic Dermatitis is a chronic persistent rash characterized by pinkness and scaling most commonly of the mid face but also can occur on the scalp (dandruff), ears; mid chest, mid back and groin.  It tends to be exacerbated by stress and cooler weather.  People who have neurologic disease may experience new onset or exacerbation of existing seborrheic dermatitis.  The condition is not curable but treatable and can be controlled. Treatment Plan: Continue Clobetasol  shampoo  2-3 times per week lather on scalp, leave on 15 minutes, rinse out, for seborrheic dermatitis   Continue Zoryve  foam every night to face at bedtime for seborrheic dermatitis    Return in about 1 year (around 08/09/2025) for Psoriasis .  IFay Kirks, CMA, am acting as scribe for Alm Rhyme, MD .   Documentation: I have reviewed the above documentation for accuracy and completeness, and I agree with the above.  Alm Rhyme, MD

## 2024-08-09 NOTE — Patient Instructions (Signed)

## 2024-08-10 ENCOUNTER — Encounter: Payer: Self-pay | Admitting: Dermatology

## 2024-08-12 ENCOUNTER — Ambulatory Visit
Admission: RE | Admit: 2024-08-12 | Discharge: 2024-08-12 | Disposition: A | Discharge status: Home / Self Care | Source: Ambulatory Visit | Attending: Surgery | Admitting: Surgery

## 2024-08-12 ENCOUNTER — Other Ambulatory Visit (HOSPITAL_COMMUNITY)
Admission: RE | Admit: 2024-08-12 | Discharge: 2024-08-12 | Disposition: A | Source: Ambulatory Visit | Attending: Surgery | Admitting: Surgery

## 2024-08-12 DIAGNOSIS — E041 Nontoxic single thyroid nodule: Secondary | ICD-10-CM | POA: Insufficient documentation

## 2024-08-19 LAB — CYTOLOGY - NON PAP

## 2024-09-02 NOTE — Progress Notes (Signed)
 Surgery orders requested via Epic inbox.

## 2024-09-03 ENCOUNTER — Ambulatory Visit: Payer: Self-pay | Admitting: General Surgery

## 2024-09-04 NOTE — Patient Instructions (Signed)
 SURGICAL WAITING ROOM VISITATION Patients having surgery or a procedure may have no more than 2 support people in the waiting area - these visitors may rotate in the visitor waiting room.   If the patient needs to stay at the hospital during part of their recovery, the visitor guidelines for inpatient rooms apply.  PRE-OP VISITATION  Pre-op nurse will coordinate an appropriate time for 1 support person to accompany the patient in pre-op.  This support person may not rotate.  This visitor will be contacted when the time is appropriate for the visitor to come back in the pre-op area.  Temporary Visitor Restrictions   Children ages 67 and under will not be able to visit patients in [facility] under most circumstances. Visitation is not restricted outside of hospitals unless noted otherwise in the [facility] and Location Specific Visitation Guidelines at :       [email or web address]  Visitors with respiratory illnesses are discouraged from visiting and should remain at home.  You are not required to quarantine at this time prior to your surgery. However, you must do this: Hand Hygiene often Do NOT share personal items Notify your provider if you are in close contact with someone who has COVID or you develop fever 100.4 or greater, new onset of sneezing, cough, sore throat, shortness of breath or body aches.  If you test positive for Covid or have been in contact with anyone that has tested positive in the last 10 days please notify you surgeon.    Your procedure is scheduled on:  Monday  09-26-2024  Report to [facility] Main Entrance: [address] entrance where the [employer] is available.   Report to admitting at: 12:15 PM  Call this number if you have any questions or problems the morning of surgery [phone]  FOLLOW ANY ADDITIONAL PRE OP INSTRUCTIONS YOU RECEIVED FROM YOUR SURGEON'S OFFICE!!!  Do not eat food after Midnight the night prior to your  surgery/procedure.  After Midnight you may have the following liquids until  11:30 AM DAY OF SURGERY  Clear Liquid Diet Water Black Coffee (sugar ok, NO MILK/CREAM OR CREAMERS)  Tea (sugar ok, NO MILK/CREAM OR CREAMERS) regular and decaf                             Plain Jell-O  with no fruit (NO RED)                                           Fruit ices (not with fruit pulp, NO RED)                                     Popsicles (NO RED)                                                                  Juice: NO CITRUS JUICES: only apple, WHITE grape, WHITE cranberry Sports drinks like Gatorade or Powerade (NO RED)  Oral Hygiene is also important to reduce your risk of infection.        Remember - BRUSH YOUR TEETH THE MORNING OF SURGERY WITH YOUR REGULAR TOOTHPASTE  Do NOT smoke after Midnight the night before surgery.  STOP TAKING all Vitamins, Herbs and supplements 1 week before your surgery.   Take ONLY these medicines the morning of surgery with A SIP OF WATER: Tylenol , and Albuterol  (Airsupra ) inhaler if needed.  DO NOT TAKE   Methylphenidate  the morning of your surgery.                  You may not have any metal on your body including hair pins, jewelry, and body piercing  Do not wear make-up, lotions, powders, perfumes or deodorant  Do not wear nail polish including gel and S&S, artificial / acrylic nails, or any other type of covering on natural nails including finger and toenails. If you have artificial nails, gel coating, etc., that needs to be removed by a nail salon, Please have this removed prior to surgery. Not doing so may mean that your surgery could be cancelled or delayed if the Surgeon or anesthesia staff feels like they are unable to monitor you safely.   Do not shave 48 hours prior to surgery to avoid nicks in your skin which may contribute to postoperative infections.   Contacts, Hearing Aids, dentures or bridgework may not be worn into surgery.  DENTURES WILL BE REMOVED PRIOR TO SURGERY PLEASE DO NOT APPLY Poly grip OR ADHESIVES!!!  You may bring a small overnight bag with you on the day of surgery, only pack items that are not valuable. [facility] IS NOT RESPONSIBLE   FOR VALUABLES THAT ARE LOST OR STOLEN.   Do not bring your home medications to the hospital. The Pharmacy will dispense medications listed on your medication list to you during your admission in the Hospital.  Please read over the following fact sheets you were given: IF YOU HAVE QUESTIONS ABOUT YOUR PRE-OP INSTRUCTIONS, PLEASE CALL [phone]    [facility] - Preparing for Surgery        Before surgery, you can play an important role.  Because skin is not sterile, your skin needs to be as free of germs as possible.  You can reduce the number of germs on your skin by washing with CHG (chlorahexidine gluconate) soap before surgery.  CHG is an antiseptic cleaner which kills germs and bonds with the skin to continue killing germs even after washing. Please DO NOT use if you have an allergy to CHG or antibacterial soaps.  If your skin becomes reddened/irritated stop using the CHG and inform your nurse when you arrive at Short Stay. Do not shave (including legs and underarms) for at least 48 hours prior to the first CHG shower.  You may shave your face/neck.  Please follow these instructions carefully:  1.  Shower with CHG Soap the night before surgery ONLY (DO NOT USE THE CHG SOAP THE MORNING OF SURGERY).  2.  If you choose to wash your hair, wash your hair first as usual with your normal  shampoo.  3.  After you shampoo, rinse your hair and body thoroughly to remove the shampoo.                             4.  Use CHG as you would any other liquid soap.  You can apply chg directly to  the skin and wash.  Gently with a scrungie or clean washcloth.  5.  Apply the CHG Soap to your body ONLY FROM THE NECK DOWN.   Do not use on face/ open                           Wound or  open sores. Avoid contact with eyes, ears mouth and genitals (private parts).                       Wash face,  Genitals (private parts) with your normal soap.             6.  Wash thoroughly, paying special attention to the area where your  surgery  will be performed.  7.  Thoroughly rinse your body with warm water from the neck down.  8.  DO NOT shower/wash with your normal soap after using and rinsing off the CHG Soap.                9.  Pat yourself dry with a clean towel.            10.  Wear clean pajamas.            11.  Place clean sheets on your bed the night of your first shower and do not  sleep with pets.  Day of Surgery : Do not apply any CHG, lotions/deodorants the morning of surgery.  Please wear clean clothes to the hospital/surgery center.   FAILURE TO FOLLOW THESE INSTRUCTIONS MAY RESULT IN THE CANCELLATION OF YOUR SURGERY  PATIENT SIGNATURE_________________________________  NURSE SIGNATURE__________________________________  ________________________________________________________________________

## 2024-09-04 NOTE — Progress Notes (Signed)
 COVID Vaccine received:  [x]  No []  Yes Date of any COVID positive Test in last 90 days:  none  PCP - Verneita Kettering, MD  Cardiologist -   Chest x-ray - 01-05-2023  2v  Epic  EKG -  2023- showed nonspecific T Wave abnormality.  Patient has no S&S at PST but patient requested that we do one just to make sure everything is alright she has chronic asthma.   Stress Test -  ECHO -  Cardiac Cath -  CT Coronary Calcium score:   Pacemaker / ICD device [x]  No []  Yes   Spinal Cord Stimulator:[x]  No []  Yes       History of Sleep Apnea? [x]  No []  Yes   CPAP used?- [x]  No []  Yes    Medication on DOS: Tylenol , and Albuterol  (Airsupra ) inhaler Hold DOS:  Methylphenidate   Patient has: [x]  NO Hx DM   []  Pre-DM   []  DM1  []   DM2 Does the patient monitor blood sugar?   [x]  N/A   []  No []  Yes   Blood Thinner / Instructions:  none Aspirin Instructions:  none   Activity level: Able to walk up 2 flights of stairs without becoming significantly short of breath or having chest pain?  [x]  No  would have SOB d/t chronic asthma  []    Yes  Patient can perform ADLs without assistance. []  No   [x]   Yes  Anesthesia review: MDD, ADHD, anemia, chronic asthma  Patient denies any S&S of respiratory illness or Covid - no shortness of breath, fever, cough or chest pain at PAT appointment.  Patient verbalized understanding and agreement to the Pre-Surgical Instructions that were given to them at this PAT appointment. Patient was also educated of the need to review these PAT instructions again prior to her surgery.I reviewed the appropriate phone numbers to call if they have any and questions or concerns.

## 2024-09-06 ENCOUNTER — Other Ambulatory Visit: Payer: Self-pay

## 2024-09-06 ENCOUNTER — Encounter (HOSPITAL_COMMUNITY): Payer: Self-pay

## 2024-09-06 ENCOUNTER — Encounter (HOSPITAL_COMMUNITY): Admission: RE | Admit: 2024-09-06 | Discharge: 2024-09-06 | Attending: General Surgery

## 2024-09-06 VITALS — BP 104/75 | HR 65 | Temp 98.6°F | Resp 14 | Ht 63.0 in | Wt 116.0 lb

## 2024-09-06 DIAGNOSIS — F901 Attention-deficit hyperactivity disorder, predominantly hyperactive type: Secondary | ICD-10-CM | POA: Insufficient documentation

## 2024-09-06 DIAGNOSIS — Z01818 Encounter for other preprocedural examination: Secondary | ICD-10-CM | POA: Diagnosis present

## 2024-09-06 DIAGNOSIS — E041 Nontoxic single thyroid nodule: Secondary | ICD-10-CM | POA: Diagnosis not present

## 2024-09-06 DIAGNOSIS — Z79899 Other long term (current) drug therapy: Secondary | ICD-10-CM | POA: Diagnosis not present

## 2024-09-06 DIAGNOSIS — F909 Attention-deficit hyperactivity disorder, unspecified type: Secondary | ICD-10-CM

## 2024-09-06 DIAGNOSIS — D509 Iron deficiency anemia, unspecified: Secondary | ICD-10-CM | POA: Insufficient documentation

## 2024-09-06 HISTORY — DX: Malignant (primary) neoplasm, unspecified: C80.1

## 2024-09-06 HISTORY — DX: Headache, unspecified: R51.9

## 2024-09-06 HISTORY — DX: Pneumonia, unspecified organism: J18.9

## 2024-09-06 HISTORY — DX: Unspecified asthma, uncomplicated: J45.909

## 2024-09-06 HISTORY — DX: Other complications of anesthesia, initial encounter: T88.59XA

## 2024-09-06 HISTORY — DX: Attention-deficit hyperactivity disorder, unspecified type: F90.9

## 2024-09-06 LAB — CBC
HCT: 39.7 % (ref 36.0–46.0)
Hemoglobin: 12.7 g/dL (ref 12.0–15.0)
MCH: 29.3 pg (ref 26.0–34.0)
MCHC: 32 g/dL (ref 30.0–36.0)
MCV: 91.7 fL (ref 80.0–100.0)
Platelets: 344 K/uL (ref 150–400)
RBC: 4.33 MIL/uL (ref 3.87–5.11)
RDW: 12.4 % (ref 11.5–15.5)
WBC: 6.3 K/uL (ref 4.0–10.5)
nRBC: 0 % (ref 0.0–0.2)

## 2024-09-06 LAB — COMPREHENSIVE METABOLIC PANEL WITH GFR
ALT: 10 U/L (ref 0–44)
AST: 13 U/L — ABNORMAL LOW (ref 15–41)
Albumin: 4.4 g/dL (ref 3.5–5.0)
Alkaline Phosphatase: 38 U/L (ref 38–126)
Anion gap: 8 (ref 5–15)
BUN: 12 mg/dL (ref 6–20)
CO2: 27 mmol/L (ref 22–32)
Calcium: 9.5 mg/dL (ref 8.9–10.3)
Chloride: 108 mmol/L (ref 98–111)
Creatinine, Ser: 0.83 mg/dL (ref 0.44–1.00)
GFR, Estimated: >60 mL/min (ref >60)
Glucose, Bld: 88 mg/dL (ref 70–99)
Potassium: 5.1 mmol/L (ref 3.5–5.1)
Sodium: 142 mmol/L (ref 135–145)
Total Bilirubin: 0.7 mg/dL (ref 0.0–1.2)
Total Protein: 6.8 g/dL (ref 6.5–8.1)

## 2024-09-26 ENCOUNTER — Encounter (HOSPITAL_COMMUNITY): Payer: Self-pay | Admitting: General Surgery

## 2024-09-26 ENCOUNTER — Observation Stay (HOSPITAL_COMMUNITY)
Admission: RE | Admit: 2024-09-26 | Discharge: 2024-09-27 | Disposition: A | Discharge status: Home / Self Care | Source: Ambulatory Visit | Attending: General Surgery | Admitting: General Surgery

## 2024-09-26 ENCOUNTER — Ambulatory Visit (HOSPITAL_COMMUNITY): Payer: Self-pay | Admitting: Medical

## 2024-09-26 ENCOUNTER — Ambulatory Visit (HOSPITAL_BASED_OUTPATIENT_CLINIC_OR_DEPARTMENT_OTHER): Admitting: Anesthesiology

## 2024-09-26 ENCOUNTER — Other Ambulatory Visit: Payer: Self-pay

## 2024-09-26 ENCOUNTER — Encounter (HOSPITAL_COMMUNITY)
Admission: RE | Disposition: A | Discharge status: Home / Self Care | Payer: Self-pay | Source: Ambulatory Visit | Attending: General Surgery

## 2024-09-26 DIAGNOSIS — Z01818 Encounter for other preprocedural examination: Secondary | ICD-10-CM

## 2024-09-26 DIAGNOSIS — J45909 Unspecified asthma, uncomplicated: Secondary | ICD-10-CM | POA: Diagnosis not present

## 2024-09-26 DIAGNOSIS — C770 Secondary and unspecified malignant neoplasm of lymph nodes of head, face and neck: Secondary | ICD-10-CM | POA: Insufficient documentation

## 2024-09-26 DIAGNOSIS — C73 Malignant neoplasm of thyroid gland: Secondary | ICD-10-CM

## 2024-09-26 DIAGNOSIS — F901 Attention-deficit hyperactivity disorder, predominantly hyperactive type: Secondary | ICD-10-CM

## 2024-09-26 DIAGNOSIS — Z79899 Other long term (current) drug therapy: Secondary | ICD-10-CM

## 2024-09-26 HISTORY — PX: THYROID LOBECTOMY: SHX420

## 2024-09-26 LAB — POCT PREGNANCY, URINE: Preg Test, Ur: NEGATIVE

## 2024-09-26 MED ORDER — ALBUTEROL-BUDESONIDE 90-80 MCG/ACT IN AERO: 2.0000 | INHALATION_SPRAY | RESPIRATORY_TRACT | Status: DC | PRN

## 2024-09-26 MED ORDER — FENTANYL CITRATE (PF) 100 MCG/2ML IJ SOLN
INTRAMUSCULAR | Status: DC | PRN
  Administered 2024-09-26: 25 ug via INTRAVENOUS
  Administered 2024-09-26: 100 ug via INTRAVENOUS

## 2024-09-26 MED ORDER — STERILE WATER FOR IRRIGATION IR SOLN
Status: DC | PRN
  Administered 2024-09-26: 1000 mL

## 2024-09-26 MED ORDER — PROPOFOL 10 MG/ML IV BOLUS
INTRAVENOUS | Status: AC
  Filled 2024-09-26: qty 20

## 2024-09-26 MED ORDER — ACETAMINOPHEN 500 MG PO TABS
1000.0000 mg | ORAL_TABLET | Freq: Four times a day (QID) | ORAL | Status: DC
  Administered 2024-09-27 (×2): 1000 mg via ORAL
  Filled 2024-09-26 (×2): qty 2

## 2024-09-26 MED ORDER — LIDOCAINE HCL (CARDIAC) PF 100 MG/5ML IV SOSY
PREFILLED_SYRINGE | INTRAVENOUS | Status: DC | PRN
  Administered 2024-09-26: 60 mg via INTRAVENOUS

## 2024-09-26 MED ORDER — DEXAMETHASONE SOD PHOSPHATE PF 10 MG/ML IJ SOLN
INTRAMUSCULAR | Status: DC | PRN
  Administered 2024-09-26: 8 mg via INTRAVENOUS

## 2024-09-26 MED ORDER — FENTANYL CITRATE (PF) 50 MCG/ML IJ SOSY
PREFILLED_SYRINGE | INTRAMUSCULAR | Status: AC
  Filled 2024-09-26: qty 1

## 2024-09-26 MED ORDER — OXYCODONE HCL 5 MG PO TABS: 5.0000 mg | ORAL_TABLET | Freq: Once | ORAL | Status: DC | PRN

## 2024-09-26 MED ORDER — MIDAZOLAM HCL (PF) 2 MG/2ML IJ SOLN
INTRAMUSCULAR | Status: DC | PRN
  Administered 2024-09-26: 2 mg via INTRAVENOUS

## 2024-09-26 MED ORDER — FENTANYL CITRATE (PF) 50 MCG/ML IJ SOSY
25.0000 ug | PREFILLED_SYRINGE | INTRAMUSCULAR | Status: DC | PRN
  Administered 2024-09-26 (×2): 25 ug via INTRAVENOUS

## 2024-09-26 MED ORDER — HEMOSTATIC AGENTS (NO CHARGE) OPTIME
TOPICAL | Status: DC | PRN
  Administered 2024-09-26: 1 via TOPICAL

## 2024-09-26 MED ORDER — GABAPENTIN 300 MG PO CAPS
300.0000 mg | ORAL_CAPSULE | ORAL | Status: AC
  Administered 2024-09-26: 300 mg via ORAL
  Filled 2024-09-26: qty 1

## 2024-09-26 MED ORDER — CHLORHEXIDINE GLUCONATE CLOTH 2 % EX PADS: 6.0000 | MEDICATED_PAD | Freq: Once | CUTANEOUS | Status: DC

## 2024-09-26 MED ORDER — ORAL CARE MOUTH RINSE: 15.0000 mL | Freq: Once | OROMUCOSAL | Status: AC

## 2024-09-26 MED ORDER — ALBUTEROL SULFATE (2.5 MG/3ML) 0.083% IN NEBU
2.5000 mg | INHALATION_SOLUTION | RESPIRATORY_TRACT | Status: DC | PRN
  Administered 2024-09-26: 2.5 mg via RESPIRATORY_TRACT
  Filled 2024-09-26: qty 3

## 2024-09-26 MED ORDER — SUGAMMADEX SODIUM 200 MG/2ML IV SOLN
INTRAVENOUS | Status: DC | PRN
  Administered 2024-09-26: 200 mg via INTRAVENOUS

## 2024-09-26 MED ORDER — OXYCODONE HCL 5 MG/5ML PO SOLN: 5.0000 mg | Freq: Once | ORAL | Status: DC | PRN

## 2024-09-26 MED ORDER — LIDOCAINE HCL (PF) 2 % IJ SOLN
INTRAMUSCULAR | Status: AC
  Filled 2024-09-26: qty 5

## 2024-09-26 MED ORDER — ROCURONIUM BROMIDE 10 MG/ML (PF) SYRINGE
PREFILLED_SYRINGE | INTRAVENOUS | Status: DC | PRN
  Administered 2024-09-26: 10 mg via INTRAVENOUS
  Administered 2024-09-26: 40 mg via INTRAVENOUS
  Administered 2024-09-26 (×4): 10 mg via INTRAVENOUS

## 2024-09-26 MED ORDER — MIDAZOLAM HCL 2 MG/2ML IJ SOLN
INTRAMUSCULAR | Status: AC
  Filled 2024-09-26: qty 2

## 2024-09-26 MED ORDER — CELECOXIB 200 MG PO CAPS
200.0000 mg | ORAL_CAPSULE | ORAL | Status: AC
  Administered 2024-09-26: 200 mg via ORAL
  Filled 2024-09-26: qty 1

## 2024-09-26 MED ORDER — LACTATED RINGERS IV SOLN: INTRAVENOUS | Status: DC

## 2024-09-26 MED ORDER — PROPOFOL 10 MG/ML IV BOLUS
INTRAVENOUS | Status: DC | PRN
  Administered 2024-09-26: 150 mg via INTRAVENOUS

## 2024-09-26 MED ORDER — ACETAMINOPHEN 500 MG PO TABS
1000.0000 mg | ORAL_TABLET | ORAL | Status: AC
  Administered 2024-09-26: 1000 mg via ORAL
  Filled 2024-09-26: qty 2

## 2024-09-26 MED ORDER — HYDROMORPHONE HCL 1 MG/ML IJ SOLN
1.0000 mg | INTRAMUSCULAR | Status: DC | PRN
  Administered 2024-09-26 – 2024-09-27 (×2): 1 mg via INTRAVENOUS
  Filled 2024-09-26 (×2): qty 1

## 2024-09-26 MED ORDER — 0.9 % SODIUM CHLORIDE (POUR BTL) OPTIME
TOPICAL | Status: DC | PRN
  Administered 2024-09-26: 1000 mL

## 2024-09-26 MED ORDER — BUPIVACAINE-EPINEPHRINE 0.25% -1:200000 IJ SOLN
INTRAMUSCULAR | Status: DC | PRN
  Administered 2024-09-26: 20 mL

## 2024-09-26 MED ORDER — BUDESONIDE 0.25 MG/2ML IN SUSP: 0.2500 mg | Freq: Two times a day (BID) | RESPIRATORY_TRACT | Status: DC | PRN

## 2024-09-26 MED ORDER — BUPIVACAINE-EPINEPHRINE (PF) 0.25% -1:200000 IJ SOLN
INTRAMUSCULAR | Status: AC
  Filled 2024-09-26: qty 30

## 2024-09-26 MED ORDER — FENTANYL CITRATE (PF) 100 MCG/2ML IJ SOLN
INTRAMUSCULAR | Status: AC
  Filled 2024-09-26: qty 2

## 2024-09-26 MED ORDER — HYDROCODONE-ACETAMINOPHEN 5-325 MG PO TABS
1.0000 | ORAL_TABLET | ORAL | Status: DC | PRN
  Administered 2024-09-26 – 2024-09-27 (×3): 2 via ORAL
  Filled 2024-09-26 (×3): qty 2

## 2024-09-26 MED ORDER — CEFAZOLIN SODIUM-DEXTROSE 2-4 GM/100ML-% IV SOLN
2.0000 g | INTRAVENOUS | Status: AC
  Administered 2024-09-26: 2 g via INTRAVENOUS
  Filled 2024-09-26: qty 100

## 2024-09-26 MED ORDER — ONDANSETRON HCL 4 MG/2ML IJ SOLN
INTRAMUSCULAR | Status: DC | PRN
  Administered 2024-09-26: 4 mg via INTRAVENOUS

## 2024-09-26 MED ORDER — AMISULPRIDE (ANTIEMETIC) 5 MG/2ML IV SOLN: 10.0000 mg | Freq: Once | INTRAVENOUS | Status: DC | PRN

## 2024-09-26 MED ORDER — CHLORHEXIDINE GLUCONATE 0.12 % MT SOLN
15.0000 mL | Freq: Once | OROMUCOSAL | Status: AC
  Administered 2024-09-26: 15 mL via OROMUCOSAL

## 2024-09-26 NOTE — Op Note (Signed)
 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 We discussed the options for surgery, including a lobectomy or total thyroidectomy. She wished to pursue a lobectomy. Following a discuss of the risks and benefits or surgery, written consent was obtained.  Procedure Details: The patient was brought to the operating room and placed in a supine position on the operating room table. Following administration of general anesthesia, the patient was positioned and then prepped and draped in the usual aseptic fashion. After ascertaining that an adequate level of anesthesia had been achieved, a small Kocher incision was made with #15 blade. Dissection was carried through subcutaneous tissues and platysma. Hemostasis was achieved with the electrocautery. Skin flaps were elevated cephalad and caudad from the thyroid  notch to the sternal notch. A self-retaining retractor was placed for exposure. Strap muscles were incised in the midline and dissection was begun on the right side. Strap muscles were reflected laterally. The right thyroid  lobe was normal in size. The lobe was gently mobilized with blunt dissection. Superior pole vessels were dissected out and divided individually between small and medium ligaclips with the harmonic scalpel. The superior laryngeal nerve was identified and preserved. The thyroid  lobe was rolled anteriorly. Branches of the inferior thyroid  artery were divided between small ligaclips with the harmonic scalpel. Inferior venous tributaries were divided  between ligaclips. The cancer was identified along the posterior aspect of the right thyroid  lobe. The mass was abutting the recurrent laryngeal nerve.  The recurrent laryngeal nerve was preserved along its course by carefully dissecting the thyroid  from surrounding tissues using a combination of blunt dissection and bipolar electrocautery. The ligament of Court was released with the electrocautery and the gland was mobilized onto the anterior trachea. Isthmus was mobilized across the midline. The thyroid  parenchyma was transected at the junction of the isthmus and contralateral thyroid  lobe with the harmonic scalpel. A suture was used to mark the isthmus margin. The thyroid  lobe and isthmus were submitted to pathology for review. I examined the wound and found that it was hemostatic. I identified the carotid artery. A right central neck dissection was carried out by excising the soft tissue within between the carotid artery and trachea, with the cricoid and sternal notch as superior and inferior borders. There were no obvious abnormal lymph nodes. The tissue was passed of the field and sent as a specimen.  The entire field was palpated for evidence of lymphadenopathy or extra-thyroidal disease.  No worrisome findings were noted.  No enlarged lymph nodes were identified.  The neck was irrigated with warm saline. Fibrillar was placed throughout the operative field. Strap muscles were approximated in the midline with interrupted 3-0 Vicryl sutures. Platysma was closed with interrupted 3-0 Vicryl sutures. Skin was closed with a running 4-0 Monocryl subcuticular suture.  Wound was washed and dried and Dermabond was applied. The patient was awakened from anesthesia and brought to the recovery room. The patient tolerated the procedure well.   Cordella Idler, MD [facility] Office: [phone]

## 2024-09-26 NOTE — Anesthesia Procedure Notes (Addendum)
 Procedure Name: Intubation Date/Time: 09/26/2024 12:25 PM  Performed by: Metta Andrea NOVAK, CRNAPre-anesthesia Checklist: Patient identified, Emergency Drugs available, Suction available, Patient being monitored and Timeout performed Patient Re-evaluated:Patient Re-evaluated prior to induction Oxygen Delivery Method: Circle system utilized Preoxygenation: Pre-oxygenation with 100% oxygen Induction Type: IV induction Ventilation: Mask ventilation without difficulty Laryngoscope Size: Mac and 3 Grade View: Grade I Tube type: Oral Tube size: 7.0 mm Number of attempts: 1 Airway Equipment and Method: Stylet Placement Confirmation: ETT inserted through vocal cords under direct vision, positive ETCO2 and breath sounds checked- equal and bilateral Secured at: 23 cm Tube secured with: Tape Dental Injury: Teeth and Oropharynx as per pre-operative assessment  Comments: Intubation by NORVA Rolin Robin

## 2024-09-26 NOTE — Plan of Care (Signed)
  Problem: Bowel/Gastric: Goal: Gastrointestinal status for postoperative course will improve Outcome: Progressing   Problem: Clinical Measurements: Goal: Postoperative complications will be avoided or minimized Outcome: Progressing
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
## 2024-09-26 NOTE — Transfer of Care (Signed)
 Immediate Anesthesia Transfer of Care Note  Patient: Christina Ruiz  Procedure(s) Performed: LOBECTOMY, THYROID  (Right: Neck)  Patient Location: PACU  Anesthesia Type:General  Level of Consciousness: sedated  Airway & Oxygen Therapy: Patient Spontanous Breathing and Patient connected to face mask oxygen  Post-op Assessment: Report given to RN and Post -op Vital signs reviewed and stable  Post vital signs: Reviewed and stable  Last Vitals:  Vitals Value Taken Time  BP    Temp    Pulse    Resp    SpO2      Last Pain:  Vitals:   09/26/24 1011  TempSrc: Oral         Complications: No notable events documented.

## 2024-09-26 NOTE — Anesthesia Preprocedure Evaluation (Addendum)
 "                                  Anesthesia Evaluation  Patient identified by MRN, date of birth, ID band Patient awake    Reviewed: Allergy & Precautions, NPO status , Patient's Chart, lab work & pertinent test results  History of Anesthesia Complications (+) history of anesthetic complications (required extra sedation for colonscopy)  Airway Mallampati: II  TM Distance: >3 FB Neck ROM: Full   Comment: Previous grade I view with [employer] [facility]  (+) Dental Advisory Given   Pulmonary neg shortness of breath, asthma , neg sleep apnea, neg COPD, neg recent URI   Pulmonary exam normal breath sounds clear to auscultation       Cardiovascular negative cardio ROS  Rhythm:Regular Rate:Normal     Neuro/Psych  Headaches, neg Seizures PSYCHIATRIC DISORDERS (ADHD)  Depression    H/o meningitis in 2017, 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    GI/Hepatic Neg liver ROS,neg GERD  ,,H/o Meckel diverticulum s/p excision, IBS   Endo/Other  negative endocrine ROS  Papillary thyroid  cancer  Renal/GU negative Renal ROS     Musculoskeletal   Abdominal   Peds  Hematology  (+) Blood dyscrasia, anemia Lab Results      Component                Value               Date                      WBC                      6.3                 09/06/2024                HGB                      12.7                09/06/2024                HCT                      39.7                09/06/2024                MCV                      91.7                09/06/2024                PLT                      344                 09/06/2024              Anesthesia Other Findings   Reproductive/Obstetrics                              Anesthesia Physical Anesthesia Plan  ASA: 2  Anesthesia Plan: General  Post-op Pain Management: Tylenol  PO (pre-op)*   Induction: Intravenous  PONV Risk Score and Plan: 3 and Ondansetron , Dexamethasone , Midazolam  and Treatment may vary  due to age or medical condition  Airway Management Planned: Oral ETT  Additional Equipment:   Intra-op Plan:   Post-operative Plan: Extubation in OR  Informed Consent: I have reviewed the patients History and Physical, chart, labs and discussed the procedure including the risks, benefits and alternatives for the proposed anesthesia with the patient or authorized representative who has indicated his/her understanding and acceptance.     Dental advisory given  Plan Discussed with: CRNA and Anesthesiologist  Anesthesia Plan Comments: (Risks of general anesthesia discussed including, but not limited to, sore throat, hoarse voice, chipped/damaged teeth, injury to vocal cords, nausea and vomiting, allergic reactions, lung infection, heart attack, stroke, and death. All questions answered. )         Anesthesia Quick Evaluation  "

## 2024-09-27 ENCOUNTER — Encounter (HOSPITAL_COMMUNITY): Payer: Self-pay | Admitting: General Surgery

## 2024-09-27 ENCOUNTER — Other Ambulatory Visit (HOSPITAL_COMMUNITY): Payer: Self-pay

## 2024-09-27 DIAGNOSIS — C73 Malignant neoplasm of thyroid gland: Secondary | ICD-10-CM | POA: Diagnosis not present

## 2024-09-27 LAB — BASIC METABOLIC PANEL WITH GFR
Anion gap: 8 (ref 5–15)
BUN: 9 mg/dL (ref 6–20)
CO2: 25 mmol/L (ref 22–32)
Calcium: 8.2 mg/dL — ABNORMAL LOW (ref 8.9–10.3)
Chloride: 108 mmol/L (ref 98–111)
Creatinine, Ser: 0.73 mg/dL (ref 0.44–1.00)
GFR, Estimated: >60 mL/min
Glucose, Bld: 86 mg/dL (ref 70–99)
Potassium: 4.2 mmol/L (ref 3.5–5.1)
Sodium: 141 mmol/L (ref 135–145)

## 2024-09-27 MED ORDER — ACETAMINOPHEN 325 MG PO TABS
650.0000 mg | ORAL_TABLET | Freq: Four times a day (QID) | ORAL | 0 refills | Status: AC
  Filled 2024-09-27: qty 50, 7d supply, fill #0

## 2024-09-27 MED ORDER — METHOCARBAMOL 500 MG PO TABS: 500.0000 mg | ORAL_TABLET | Freq: Three times a day (TID) | ORAL | Status: DC

## 2024-09-27 MED ORDER — GABAPENTIN 100 MG PO CAPS: 300.0000 mg | ORAL_CAPSULE | Freq: Three times a day (TID) | ORAL | Status: DC

## 2024-09-27 MED ORDER — OXYCODONE HCL 5 MG PO TABS
5.0000 mg | ORAL_TABLET | Freq: Three times a day (TID) | ORAL | 0 refills | Status: AC | PRN
  Filled 2024-09-27: qty 8, 3d supply, fill #0

## 2024-09-27 MED ORDER — IBUPROFEN 200 MG PO TABS
600.0000 mg | ORAL_TABLET | Freq: Four times a day (QID) | ORAL | 0 refills | Status: AC
  Filled 2024-09-27: qty 100, 9d supply, fill #0

## 2024-09-27 MED ORDER — CALCIUM CARBONATE ANTACID 500 MG PO CHEW
2.0000 | CHEWABLE_TABLET | Freq: Two times a day (BID) | ORAL | 0 refills | Status: AC
  Filled 2024-09-27: qty 120, 30d supply, fill #0

## 2024-09-27 MED ORDER — PHENOL 1.4 % MT LIQD: 2.0000 | OROMUCOSAL | Status: DC | PRN

## 2024-09-27 NOTE — Progress Notes (Signed)
" °   09/27/24 1119  TOC Brief Assessment  Insurance and Status Reviewed  Patient has primary care physician Yes  Home environment has been reviewed resides in a private residence  Prior level of function: Independent  Prior/Current Home Services No current home services  Social Drivers of Health Review SDOH reviewed no interventions necessary  Readmission risk has been reviewed Yes  Transition of care needs no transition of care needs at this time    "

## 2024-09-27 NOTE — Discharge Instructions (Addendum)
 After Thyroid  Surgery Home Care Instruction  Activity  The effects of anesthesia are still present and drowsiness may result.  Limit activity for the first 24 hours, then you may return to normal daily activities. Returning to normal daily activities as soon as you can following surgery will enhance recovery time.  Do not drive or operate heavy machinery within 24 hours of taking narcotic pain medications.   Do not mow the lawn, use a vacuum cleaner, or do any other strenuous activities without first consulting your surgical team.   Diet Drink plenty of fluids and eat light meals today, then resume regular diet. Some patients may find their appetite is poor for a week or two after surgery. This is a normal result of the stress of surgery-your appetite will return in time.   There are no specific diet restrictions after surgery.   Dressing and Wound Care  Keep your wound or incision site clean and dry.  Most patients will experience some swelling and bruising on the chest and neck area.  Ice packs will help for the first 48 hours after arriving home.  Swelling and bruising will take several days to resolve. You have Dermabond/Durabond (skin glue) on your incision: This will usually remain in place for 10-14 days, then naturally fall off your skin. You may take a shower 24 hrs after surgery, carefully wash, not scrub the incision site with a mild non-scented soap. Pat dry with a soft towel.  Do not pick or peel skin glue off.  You can shower and let the water  fall on the dressings above. Do not soak or submerge your incision(s) in a bath tub, hot tub, or swimming pool, until your doctor says it is ok to do so or the incision(s) have completely healed, usually about 2-4 weeks.  Do not use creams, powder, salves or balms on your incision(s).  What to Expect After Surgery   Moderate discomfort controlled with medications  Minimal drainage from incision  Feeling fatigue and weak  Constipation after  surgery is common. Drink plenty fluids and eat a high fiber diet.   Pain Control: Prescribed Non-Narcotic Pain Medication  You will be given three prescriptions.  Two of them will be for prescription strength ibuprofen  (i.e. Advil ) and prescription strength acetaminophen  (i.e. Tylenol ).  The vast majority of patients will just need these two medications.  One prescription will be for a 'rescue' prescription of an oral narcotic (oxycodone ).  You may fill this if needed.  You will alternate taking the ibuprofen  (600mg ) every 6 hours and also the acetaminophen  (650mg ) every 6 hours so that you are taking one of those medications every 3 hours.  For example: o 0800 - take ibuprofen  600mg  o 1100 - take acetaminophen  650mg  o 1400 - take ibuprofen  600mg  o 1700 - take acetaminophen  650mg  o Etc  Continue taking this alternating pattern of ibuprofen  and acetaminophen  for 3 days  If you cannot take one or the other of these medications, just take the one you can every 6 hours.  If you are comfortable at night, you don't have to wake up and take a medication.  If you are still uncomfortable after taking either ibuprofen  or acetaminophen , try gentle stretching exercise and ice packs (a bag of frozen vegetables works great).  If you are still uncomfortable, you may fill the narcotic prescription of Oxycodone  and take as directed.  Once you have completed these prescriptions, your pain level should be low enough to stop taking medications altogether  or just use an over the counter medication (ibuprofen  or acetaminophen ) as needed.    Pain Control: Over the Counter Medications to take as needed  Colace/Docusate: May be prescribed by your surgeon to prevent constipation caused by the combination of narcotics, effects of anesthesia, and decreased ambulation.  Hold for loose stools or diarrhea. Take 100 mg 1-2 times a day starting tonight.   Fiber: High fiber foods, extra liquids (water  9-13 cups/day) can also  assist with constipation. Examples of high fiber foods are fruit, bran. Prune juice and water  are also good liquids to drink.  Milk of Magnesia/Miralax:  If constipated despite takeing the over the counter stool softeners, you may take Milk of Magnesia or Miralax as directed on bottle to assist with constipation.     Pepcid /Famotidine : May be prescribed while taking naproxen (Aleve) or other NSAIDs such as ibuprofen  (Motrin /Advil ) to prevent stomach upset or Acid-reflux symptoms. Take 1 tablet 1-2 times a day.   **Constipation: The first bowel movement may occur anywhere between 1-5 days after surgery.  As long as you are not nauseated or not having significant abdominal pain this variation is acceptable. Narcotic pain medications can cause constipation increasing discomfort; early discontinuation will assist with bowel management. If constipated despite taking stool softeners, you may take Milk of Magnesia or Miralax as directed on the bottle.     **Home medications: You may restart your home medications as directed by your respective Primary Care Physician or Surgeon.   When to notify your Doctor or Healthcare Team   Sign of Wound Infection   Fever over 100 degrees.  Wound becomes extremely swollen, shows red streaks, warm to the touch, and/or drainage from the incision site or foul-smelling drainage.  Wound edges separate or opens up  Bleeding or bruising   If you have bleeding, apply pressure to the site and hold the pressure firmly for 5 minutes. If the bleeding continues, apply pressure again and call 911. If the bleeding stopped, call your doctor to report it.   Call your doctor or nurse if you have increased bleeding from your site and increased bruising or a lump forms or gets larger under your skin at the site. Unrelieved Pain   Call your doctor or nurse if your pain gets worse or is not eased 1 hour after taking your pain medicine, or if it is severe and uncontrolled. Nausea and  Vomiting   Call your doctor or nurse if you have nausea and vomiting that continues more than 24 hours, will not let you keep medicine down and will not let you keep fluids down  Fever, Flu-like symptoms   Fever over 100 degrees and/or chills  Gastrointestinal Bleeding Symptoms    Black tarry bowel movements.  This can be normal after surgery on the stomach, but should resolve in a day or two.    Call 911 if you suddenly have signs of blood loss such as:  Vomiting blood  Fast heart rate  Feeling faint, sweaty, or blacking out  Passing bright red blood from your rectum  Blood Clot Symptoms   Tender, swollen or reddened areas in your calf muscle or thighs.  Numbness or tingling in your lower leg or calf, or at the top of your leg or groin  Skin on your leg looks pale or blue or feels cold to touch  Chest pain or have trouble breathing, lightheadedness, fast heart rate  Sudden Onset of Symptoms    Call 911 if you suddenly have:  Leg weakness and spasm  Loss of bladder or bowel function  Seizure  Confusion, severe headache, dizziness or feeling unsteady, problems talking, difficulty swallowing, and/or numbness or muscle weakness as these could be signs of a stroke.  Follow up Appointment Your follow up appointment should be scheduled 2-3 weeks after your surgery date.  If you have not previously scheduled for a follow-up visit you can be scheduled by contacting [phone].

## 2024-09-27 NOTE — Discharge Summary (Signed)
 Physician Discharge Summary  Patient ID: Christina Ruiz MRN: [id number] DOB/AGE: Dec 31, 1990 31 y.o.  Admit date: 09/26/2024 Discharge date: 09/27/2024  Admission Diagnoses:  Discharge Diagnoses:  Principal Problem:   Thyroid  cancer [facility])   Discharged Condition: stable  Hospital Course: 34 y/o F with a biopsy proven right-sided papillary thyroid  cancer who presented for elective right thyroid  lobectomy. She underwent an uneventful operation and was kept in the hospital for monitoring. She had some throat pain with swallowing but no other complications. She discharged to home on POD 1. At the time of discharge she was ambulating without assistance, tolerating PO, and there was no evidence of a hematoma or other complication. She will follow up in the office.   Discharge Exam: Blood pressure 103/68, pulse (!) 56, temperature 98 F (36.7 C), temperature source Oral, resp. rate 14, height 5' 3 (1.6 m), weight 52.6 kg, SpO2 100%. Gen: female, NAD HEENT: cervical incision clean and dry with dermabond intact, no ecchymosis, no swelling, voice intact and normal  Disposition: Discharge disposition: 01-Home or Self Care        Allergies as of 09/27/2024   No Known Allergies      Medication List     TAKE these medications    acetaminophen  325 MG tablet Commonly known as: Tylenol  Take 2 tablets (650 mg total) by mouth every 6 (six) hours for 6 days. What changed:  medication strength how much to take when to take this reasons to take this   Airsupra  90-80 MCG/ACT Aero Generic drug: Albuterol -Budesonide  Inhale 2 puffs into the lungs every 4 (four) hours as needed.   calcium  carbonate 500 MG chewable tablet Commonly known as: Tums Chew 2 tablets (400 mg of elemental calcium  total) by mouth 2 (two) times daily.   ibuprofen  200 MG tablet Commonly known as: Motrin  IB Take 3 tablets (600 mg total) by mouth every 6 (six) hours for 6 days.   methylphenidate  10 MG ER tablet Take 1  tablet (10 mg total) by mouth daily.   oxyCODONE  5 MG immediate release tablet Commonly known as: Roxicodone  Take 1 tablet (5 mg total) by mouth every 8 (eight) hours as needed for up to 4 days.         Signed: Cordella DELENA Idler 09/27/2024, 5:11 PM

## 2024-09-27 NOTE — Anesthesia Postprocedure Evaluation (Signed)
"   Anesthesia Post Note  Patient: Christina Ruiz  Procedure(s) Performed: LOBECTOMY, THYROID  (Right: Neck)     Patient location during evaluation: PACU Anesthesia Type: General Level of consciousness: awake and alert Pain management: pain level controlled Vital Signs Assessment: post-procedure vital signs reviewed and stable Respiratory status: spontaneous breathing, nonlabored ventilation and respiratory function stable Cardiovascular status: blood pressure returned to baseline Postop Assessment: no apparent nausea or vomiting Anesthetic complications: no   No notable events documented.        Vertell Row      "

## 2024-09-27 NOTE — Progress Notes (Signed)
" ° ° °  1 Day Post-Op  Subjective: Reports that her throat is sore. Has not been out of bed or eaten anything yet. No neck swelling or bruising.   ROS: See above, otherwise other systems negative  Objective: Vital signs in last 24 hours: Temp:  [97.2 F (36.2 C)-98.6 F (37 C)] 98 F (36.7 C) (01/06 0535) Pulse Rate:  [56-108] 56 (01/06 0535) Resp:  [14-22] 14 (01/06 0535) BP: (100-123)/(56-83) 103/68 (01/06 0535) SpO2:  [94 %-100 %] 100 % (01/06 0535) Weight:  [52.6 kg] 52.6 kg (01/05 1013)    Intake/Output from previous day: 01/05 0701 - 01/06 0700 In: 1420 [P.O.:420; I.V.:900; IV Piggyback:100] Out: -  Intake/Output this shift: No intake/output data recorded.  PE: Gen: female, NAD HEENT: incision clean and dry with dermabond intact, no swelling, no ecchymosis  Lab Results:  No results for input(s): WBC, HGB, HCT, PLT in the last 72 hours. BMET Recent Labs    09/27/24 0459  NA 141  K 4.2  CL 108  CO2 25  GLUCOSE 86  BUN 9  CREATININE 0.73  CALCIUM  8.2*   PT/INR No results for input(s): LABPROT, INR in the last 72 hours. CMP     Component Value Date/Time   NA 141 09/27/2024 0459   NA 137 08/14/2021 0908   NA 140 04/11/2013 1524   K 4.2 09/27/2024 0459   K 3.4 (L) 04/11/2013 1524   CL 108 09/27/2024 0459   CL 108 (H) 04/11/2013 1524   CO2 25 09/27/2024 0459   CO2 27 04/11/2013 1524   GLUCOSE 86 09/27/2024 0459   GLUCOSE 97 04/11/2013 1524   BUN 9 09/27/2024 0459   BUN 6 08/14/2021 0908   BUN 11 04/11/2013 1524   CREATININE 0.73 09/27/2024 0459   CREATININE 0.92 04/11/2013 1524   CALCIUM  8.2 (L) 09/27/2024 0459   CALCIUM  9.1 04/11/2013 1524   PROT 6.8 09/06/2024 0932   PROT 6.0 08/14/2021 0908   ALBUMIN 4.4 09/06/2024 0932   ALBUMIN 3.5 (L) 08/14/2021 0908   AST 13 (L) 09/06/2024 0932   ALT 10 09/06/2024 0932   ALKPHOS 38 09/06/2024 0932   BILITOT 0.7 09/06/2024 0932   BILITOT 0.5 08/14/2021 0908   GFRNONAA >60 09/27/2024 0459    GFRNONAA >60 04/11/2013 1524   GFRAA >60 05/02/2020 1631   GFRAA >60 04/11/2013 1524   Lipase     Component Value Date/Time   LIPASE 31 02/23/2016 0441    Studies/Results: No results found.  Anti-infectives: Anti-infectives (From admission, onward)    Start     Dose/Rate Route Frequency Ordered Stop   09/26/24 1100  ceFAZolin  (ANCEF ) IVPB 2g/100 mL premix        2 g 200 mL/hr over 30 Minutes Intravenous On call to O.R. 09/26/24 1009 09/26/24 1226       Assessment/Plan  34 y/o F POD 1 from right thyroid  lobectomy  - Path pending - Regular diet - OOB this morning - She should be able to go home today but will assess later this morning   LOS: 0 days   Cordella DELENA Polly Marlis Cheron Surgery 09/27/2024, 8:51 AM Please see Amion for pager number during day hours 7:00am-4:30pm or 7:00am -11:30am on weekends  "

## 2024-09-27 NOTE — Progress Notes (Signed)
 Discharge meds in a secure bag delivered to patient by this RN

## 2024-09-28 ENCOUNTER — Telehealth: Payer: Self-pay

## 2024-09-28 LAB — SURGICAL PATHOLOGY

## 2024-09-28 NOTE — Transitions of Care (Post Inpatient/ED Visit) (Signed)
" ° °  09/28/2024  Name: Christina Ruiz MRN: [id number] DOB: 10-May-1991  Today's TOC FU Call Status: Today's TOC FU Call Status:: Successful TOC FU Call Completed  Patient's Name and Date of Birth confirmed. DOB, Name  Transition Care Management Follow-up Telephone Call Date of Discharge: 09/26/24 Discharge Facility: [facility]) Type of Discharge: Inpatient Admission Primary Inpatient Discharge Diagnosis:: thyroid  cancer How have you been since you were released from the hospital?: Better Any questions or concerns?: No  Items Reviewed: Did you receive and understand the discharge instructions provided?: Yes Medications obtained,verified, and reconciled?: Yes (Medications Reviewed) Any new allergies since your discharge?: No Dietary orders reviewed?: No Do you have support at home?: Yes People in Home [RPT]: parent(s)  Medications Reviewed Today: Medications Reviewed Today   Medications were not reviewed in this encounter     Home Care and Equipment/Supplies: Were Home Health Services Ordered?: NA Any new equipment or medical supplies ordered?: NA  Functional Questionnaire: Do you need assistance with bathing/showering or dressing?: No Do you need assistance with meal preparation?: No Do you need assistance with eating?: No Do you have difficulty maintaining continence: No Do you need assistance with getting out of bed/getting out of a chair/moving?: No Do you have difficulty managing or taking your medications?: No  Follow up appointments reviewed: PCP Follow-up appointment confirmed?: NA Specialist Hospital Follow-up appointment confirmed?: Yes Date of Specialist follow-up appointment?:  (PAtient wasnt sure) Do you need transportation to your follow-up appointment?: No Do you understand care options if your condition(s) worsen?: Yes-patient verbalized understanding The patient was resting and working on healing. She has a FU with her cancer specialist but she wasn't  sure exactly when.   SIGNATUREBETHA Carrier M,CMA "

## 2024-10-03 ENCOUNTER — Other Ambulatory Visit: Payer: Self-pay | Admitting: General Surgery

## 2024-10-03 DIAGNOSIS — C73 Malignant neoplasm of thyroid gland: Secondary | ICD-10-CM

## 2024-10-04 ENCOUNTER — Inpatient Hospital Stay: Admission: RE | Admit: 2024-10-04 | Discharge: 2024-10-04 | Attending: General Surgery

## 2024-10-04 DIAGNOSIS — C73 Malignant neoplasm of thyroid gland: Secondary | ICD-10-CM

## 2024-10-04 MED ORDER — IOPAMIDOL (ISOVUE-300) INJECTION 61%
75.0000 mL | Freq: Once | INTRAVENOUS | Status: AC | PRN
  Administered 2024-10-04: 75 mL via INTRAVENOUS

## 2024-10-07 ENCOUNTER — Telehealth: Payer: Self-pay | Admitting: *Deleted

## 2024-10-07 NOTE — Telephone Encounter (Signed)
 Copied from CRM [id number]. Topic: Clinical - Request for Lab/Test Order >> Oct 07, 2024  1:31 PM Drema MATSU wrote: Reason for CRM:  Pt surgeon placed orders to [facility] for labs and pt wants to know if she can have the labs done here. She stated that she is not sure of all the labs he is needing but some are BMP, thyroid  level, and calcium  .

## 2024-10-07 NOTE — Telephone Encounter (Signed)
 Called and informed pt that we are unable to see orders her surgeon placed. Informed pt that's he can go to a [facility] draw station such as the one in [place] [employer] on [address] as they should be able to have the lab orders on file.

## 2024-12-14 ENCOUNTER — Ambulatory Visit (HOSPITAL_COMMUNITY): Admit: 2024-12-14 | Admitting: General Surgery

## 2024-12-14 SURGERY — THYROIDECTOMY
Anesthesia: General

## 2025-01-27 ENCOUNTER — Ambulatory Visit: Admitting: Internal Medicine

## 2025-08-09 ENCOUNTER — Ambulatory Visit: Admitting: Dermatology
